# Patient Record
Sex: Male | Born: 1945
Health system: Southern US, Community
[De-identification: ages and names within clinical notes are randomized; demographics above are authoritative.]

## PROBLEM LIST (undated history)

## (undated) DIAGNOSIS — E119 Type 2 diabetes mellitus without complications: Secondary | ICD-10-CM

## (undated) DIAGNOSIS — M4800 Spinal stenosis, site unspecified: Secondary | ICD-10-CM

## (undated) DIAGNOSIS — M1990 Unspecified osteoarthritis, unspecified site: Secondary | ICD-10-CM

## (undated) DIAGNOSIS — H269 Unspecified cataract: Secondary | ICD-10-CM

## (undated) HISTORY — DX: Unspecified cataract: H26.9

## (undated) HISTORY — DX: Unspecified osteoarthritis, unspecified site: M19.90

## (undated) HISTORY — DX: Spinal stenosis, site unspecified: M48.00

## (undated) HISTORY — PX: CATARACT EXTRACTION, BILATERAL: SHX1313

---

## 2003-08-25 ENCOUNTER — Emergency Department (HOSPITAL_COMMUNITY): Admission: AD | Admit: 2003-08-25 | Discharge: 2003-08-25 | Payer: Self-pay | Admitting: Family Medicine

## 2017-12-03 DIAGNOSIS — M25529 Pain in unspecified elbow: Secondary | ICD-10-CM | POA: Insufficient documentation

## 2018-09-16 DIAGNOSIS — M25561 Pain in right knee: Secondary | ICD-10-CM | POA: Insufficient documentation

## 2018-09-16 DIAGNOSIS — M109 Gout, unspecified: Secondary | ICD-10-CM | POA: Insufficient documentation

## 2018-11-05 DIAGNOSIS — M19039 Primary osteoarthritis, unspecified wrist: Secondary | ICD-10-CM | POA: Insufficient documentation

## 2019-07-19 ENCOUNTER — Other Ambulatory Visit: Payer: Self-pay

## 2019-07-19 ENCOUNTER — Encounter (HOSPITAL_COMMUNITY): Payer: Self-pay | Admitting: Emergency Medicine

## 2019-07-19 ENCOUNTER — Emergency Department (HOSPITAL_COMMUNITY)
Admission: EM | Admit: 2019-07-19 | Discharge: 2019-07-20 | Disposition: A | Discharge status: Home / Self Care | Payer: Medicare Other | Attending: Emergency Medicine | Admitting: Emergency Medicine

## 2019-07-19 ENCOUNTER — Emergency Department (HOSPITAL_COMMUNITY): Payer: Medicare Other

## 2019-07-19 DIAGNOSIS — Z7984 Long term (current) use of oral hypoglycemic drugs: Secondary | ICD-10-CM | POA: Insufficient documentation

## 2019-07-19 DIAGNOSIS — M542 Cervicalgia: Secondary | ICD-10-CM | POA: Diagnosis present

## 2019-07-19 DIAGNOSIS — Z79899 Other long term (current) drug therapy: Secondary | ICD-10-CM | POA: Diagnosis not present

## 2019-07-19 DIAGNOSIS — M4802 Spinal stenosis, cervical region: Secondary | ICD-10-CM

## 2019-07-19 DIAGNOSIS — E119 Type 2 diabetes mellitus without complications: Secondary | ICD-10-CM | POA: Diagnosis not present

## 2019-07-19 HISTORY — DX: Type 2 diabetes mellitus without complications: E11.9

## 2019-07-19 LAB — CBC
HCT: 38.7 % — ABNORMAL LOW (ref 39.0–52.0)
Hemoglobin: 12.5 g/dL — ABNORMAL LOW (ref 13.0–17.0)
MCH: 29.3 pg (ref 26.0–34.0)
MCHC: 32.3 g/dL (ref 30.0–36.0)
MCV: 90.6 fL (ref 80.0–100.0)
Platelets: 240 10*3/uL (ref 150–400)
RBC: 4.27 MIL/uL (ref 4.22–5.81)
RDW: 14.4 % (ref 11.5–15.5)
WBC: 10.5 10*3/uL (ref 4.0–10.5)
nRBC: 0 % (ref 0.0–0.2)

## 2019-07-19 LAB — BASIC METABOLIC PANEL
Anion gap: 11 (ref 5–15)
BUN: 23 mg/dL (ref 8–23)
CO2: 20 mmol/L — ABNORMAL LOW (ref 22–32)
Calcium: 10.5 mg/dL — ABNORMAL HIGH (ref 8.9–10.3)
Chloride: 104 mmol/L (ref 98–111)
Creatinine, Ser: 1.54 mg/dL — ABNORMAL HIGH (ref 0.61–1.24)
GFR calc Af Amer: 51 mL/min — ABNORMAL LOW (ref >60)
GFR calc non Af Amer: 44 mL/min — ABNORMAL LOW (ref >60)
Glucose, Bld: 209 mg/dL — ABNORMAL HIGH (ref 70–99)
Potassium: 4.1 mmol/L (ref 3.5–5.1)
Sodium: 135 mmol/L (ref 135–145)

## 2019-07-19 LAB — TROPONIN I (HIGH SENSITIVITY)
Troponin I (High Sensitivity): 10 ng/L (ref <18)
Troponin I (High Sensitivity): 10 ng/L (ref <18)

## 2019-07-19 MED ORDER — MORPHINE SULFATE (PF) 4 MG/ML IV SOLN
4.0000 mg | Freq: Once | INTRAVENOUS | Status: AC
  Administered 2019-07-19: 4 mg via INTRAVENOUS
  Filled 2019-07-19: qty 1

## 2019-07-19 MED ORDER — PREDNISONE 10 MG (21) PO TBPK: ORAL_TABLET | ORAL | 0 refills | Status: DC

## 2019-07-19 MED ORDER — HYDROCODONE-ACETAMINOPHEN 5-325 MG PO TABS: 1.0000 | ORAL_TABLET | Freq: Four times a day (QID) | ORAL | 0 refills | Status: DC | PRN

## 2019-07-19 NOTE — Discharge Instructions (Addendum)
Take the medications as prescribed, follow-up with the neurosurgery doctor for further evaluation

## 2019-07-19 NOTE — ED Notes (Signed)
Patient transported to MRI 

## 2019-07-19 NOTE — ED Notes (Signed)
Patient transported to X-ray 

## 2019-07-19 NOTE — ED Triage Notes (Signed)
Pt reports that having neck pains that radiates in bilat shoulders and back x 4 days. Reports that he drove down here from [place] on Sunday. Reports pains are worse when trying to get up from lying position.

## 2019-07-19 NOTE — ED Provider Notes (Signed)
[facility] DEPT Provider Note   CSN: [id number] Arrival date & time: 07/19/19  1737     History   Chief Complaint Chief Complaint  Patient presents with  . Neck Pain  . Shoulder Pain  . Back Pain    HPI Geza Beranek is a 73 y.o. male.     HPI Pt started to have pain in his upper back and shoulder last week.  He was packing up for a move down to [place].  Sx started about a day later.  He drove down to [place] and the sx persisted.     The pain stays in his upper back.  It hurts to move certain positions and roll over.  It goes to his upper neck and both shoulders. He is feeling fatigued.   No shortness of breath.  No cough.  No fevers. No numbness.  H Past Medical History:  Diagnosis Date  . Diabetes mellitus without complication ([place])     There are no active problems to display for this patient.   History reviewed. No pertinent surgical history.      Home Medications    Prior to Admission medications   Medication Sig Start Date End Date Taking? Authorizing Provider  glipiZIDE (GLUCOTROL) 5 MG tablet Take 5 mg by mouth daily before breakfast.   Yes [provider]  ibuprofen (ADVIL) 200 MG tablet Take 200 mg by mouth every 6 (six) hours as needed for moderate pain.   Yes [provider]  Lactobacillus (PROBIOTIC ACIDOPHILUS PO) Take 1 capsule by mouth daily.   Yes [provider]  HYDROcodone-acetaminophen (NORCO/VICODIN) 5-325 MG tablet Take 1 tablet by mouth every 6 (six) hours as needed. 07/19/19   Dorie Rank, MD  predniSONE (STERAPRED UNI-PAK 21 TAB) 10 MG (21) TBPK tablet Take 6 tabs by mouth daily  for 2 days, then 5 tabs for 2 days, then 4 tabs for 2 days, then 3 tabs for 2 days, 2 tabs for 2 days, then 1 tab by mouth daily for 2 days 07/19/19   Dorie Rank, MD    Family History No family history on file.  Social History Social History   Tobacco Use  . Smoking status: Never Smoker  . Smokeless tobacco: Never Used   Substance Use Topics  . Alcohol use: Yes  . Drug use: Not on file     Allergies   Other   Review of Systems Review of Systems  Constitutional: Negative for fever.  Cardiovascular: Negative for chest pain.  Gastrointestinal: Negative for abdominal pain.  All other systems reviewed and are negative.    Physical Exam Updated Vital Signs BP (!) 147/85   Pulse 68   Temp 100 F (37.8 C) (Oral)   Resp 16   SpO2 100%   Physical Exam Vitals signs and nursing note reviewed.  Constitutional:      General: He is not in acute distress.    Appearance: He is well-developed.  HENT:     Head: Normocephalic and atraumatic.     Right Ear: External ear normal.     Left Ear: External ear normal.  Eyes:     General: No scleral icterus.       Right eye: No discharge.        Left eye: No discharge.     Conjunctiva/sclera: Conjunctivae normal.  Neck:     Musculoskeletal: Neck supple.     Trachea: No tracheal deviation.  Cardiovascular:     Rate and  Rhythm: Normal rate and regular rhythm.  Pulmonary:     Effort: Pulmonary effort is normal. No respiratory distress.     Breath sounds: Normal breath sounds. No stridor. No wheezing or rales.  Abdominal:     General: Bowel sounds are normal. There is no distension.     Palpations: Abdomen is soft.     Tenderness: There is no abdominal tenderness. There is no guarding or rebound.  Musculoskeletal:        General: No tenderness.  Skin:    General: Skin is warm and dry.     Findings: No rash.  Neurological:     Mental Status: He is alert.     Cranial Nerves: No cranial nerve deficit (no facial droop, extraocular movements intact, no slurred speech).     Sensory: No sensory deficit.     Motor: Weakness present. No abnormal muscle tone or seizure activity.     Coordination: Coordination normal.     Comments: Grip strength weak bilaterally, patient does have normal bicep flexion bilaterally, questionable weak tricep extension  bilaterally, 5 out of 5 lower extremity strength      ED Treatments / Results  Labs (all labs ordered are listed, but only abnormal results are displayed) Labs Reviewed  CBC - Abnormal; Notable for the following components:      Result Value   Hemoglobin 12.5 (*)    HCT 38.7 (*)    All other components within normal limits  BASIC METABOLIC PANEL - Abnormal; Notable for the following components:   CO2 20 (*)    Glucose, Bld 209 (*)    Creatinine, Ser 1.54 (*)    Calcium 10.5 (*)    GFR calc non Af Amer 44 (*)    GFR calc Af Amer 51 (*)    All other components within normal limits  TROPONIN I (HIGH SENSITIVITY)  TROPONIN I (HIGH SENSITIVITY)    EKG EKG Interpretation  Date/Time:  Tuesday July 19 2019 19:11:39 EDT Ventricular Rate:  71 PR Interval:    QRS Duration: 85 QT Interval:  363 QTC Calculation: 395 R Axis:   33 Text Interpretation:  Sinus rhythm Borderline ST elevation, lateral leads No old tracing to compare Confirmed by Linwood Dibbles [phone]) on 07/19/2019 7:17:51 PM   Radiology Dg Cervical Spine Complete  Result Date: 07/19/2019 CLINICAL DATA:  Neck pain and bilateral shoulder back pain. EXAM: CERVICAL SPINE - COMPLETE 4+ VIEW COMPARISON:  None. FINDINGS: There is no fracture or bone destruction. There is severe degenerative disc disease throughout the cervical spine with disc space narrowing, reversal of the cervical lordosis, osteophyte formation, and multilevel facet arthritis. There is moderate left foraminal stenosis at C3-4. Slight foraminal stenoses at the other levels. IMPRESSION: Multilevel severe degenerative disc with moderate left foraminal stenosis at C3-4. Electronically Signed   By: Francene Boyers M.D.   On: 07/19/2019 19:21   Mr Cervical Spine Wo Contrast  Result Date: 07/19/2019 CLINICAL DATA:  Initial evaluation for acute neck pain. EXAM: MRI CERVICAL SPINE WITHOUT CONTRAST TECHNIQUE: Multiplanar, multisequence MR imaging of the cervical spine was  performed. No intravenous contrast was administered. COMPARISON:  Prior radiograph from earlier same day. FINDINGS: Alignment: Mild reversal of the normal cervical lordosis, apex at C3-4. Trace retrolisthesis of C6 on C7, with mild grade 1 anterolisthesis of C7 on T1. Vertebrae: Vertebral body height maintained without evidence for acute or chronic fracture. Bone marrow signal intensity diffusely heterogeneous due to underlying advanced degenerative change. Small benign hemangioma noted  within the T3 vertebral body. No other discrete or worrisome osseous lesions. Reactive marrow edema seen about the left C5-6, C6-7, and C7-T1 facets as well as the right C7-T1 facet due to facet arthritis. Cord: Signal intensity within the cervical spinal cord grossly within normal limits, although evaluation mildly limited by motion. Posterior Fossa, vertebral arteries, paraspinal tissues: Visualized brain and posterior fossa within normal limits. Craniocervical junction normal. Mild diffuse prevertebral/retropharyngeal edema/effusion, nonspecific, but could be reactive in nature. Paraspinous soft tissues demonstrate no other acute finding. Normal intravascular flow voids seen within the vertebral arteries bilaterally. Disc levels: C2-C3: Mild disc bulge with bilateral uncovertebral hypertrophy. Moderate right greater than left facet degeneration. Flattening of the ventral thecal sac without significant spinal stenosis. Severe right with moderate left C3 foraminal narrowing. C3-C4: Chronic diffuse degenerative disc osteophyte with intervertebral disc space narrowing. Disc osteophyte asymmetric to the left with left greater than right uncovertebral spurring. Broad posterior component flattens and indents the ventral thecal sac, greater on the left. Superimposed left greater than right facet hypertrophy. Resultant moderate spinal stenosis with mild flattening of the ventral spinal cord, greater on the left. Severe left with moderate  right C4 foraminal stenosis. C4-C5: Chronic diffuse degenerative disc osteophyte with intervertebral disc space narrowing. Broad posterior component effaces the ventral thecal sac, greater on the right. Superimposed moderate facet hypertrophy. Resultant moderate spinal stenosis with mild cord flattening. Severe right greater than left C5 foraminal narrowing. C5-C6: Chronic diffuse degenerative disc osteophyte with intervertebral disc space narrowing. Broad posterior component effaces the ventral thecal sac. Superimposed mild facet hypertrophy. Resultant moderate canal with severe bilateral C6 foraminal stenosis. C6-C7: Chronic intervertebral disc space narrowing with diffuse degenerative disc osteophyte. Broad posterior component largely effaces the ventral thecal sac. Superimposed moderate facet hypertrophy with ligament flavum thickening. Moderate spinal stenosis with moderate to severe left greater than right C7 foraminal narrowing. C7-T1: 3 mm anterolisthesis. Associated broad posterior pseudo disc bulge/uncovering. Advanced bilateral facet degeneration with ligamentum flavum hypertrophy. Resultant moderate spinal stenosis with moderate bilateral C8 foraminal narrowing. T1-2: Diffuse degenerative disc osteophyte with intervertebral disc space narrowing. Moderate to advanced bilateral facet hypertrophy. No significant spinal stenosis. Moderate bilateral foraminal narrowing. T2-3: Mild diffuse disc bulge. Mild right greater than left facet hypertrophy. No significant spinal stenosis. Moderate right with mild left foraminal narrowing. IMPRESSION: 1. Advanced multilevel cervical spondylolysis with resultant moderate diffuse spinal stenosis at C3-4 through C7-T1 as above. 2. Multifactorial degenerative changes with resultant moderate to severe multilevel neural foraminal narrowing at essentially all levels as detailed above. 3. Mild reactive edema about the left C5-6 and C6-7 facets as well as the bilateral C7-T1  facets due to facet arthritis. Finding could contribute to underlying neck pain. 4. Mild diffuse prevertebral/retropharyngeal edema/effusion, suspected to be reactive in nature due to underlying extensive degenerative changes. Differential considerations include sequelae of low-grade ligamentous strain/injury or infection, although there is no overt evidence for acute infection within the cervical spine. If there is concern for other acute pathology within the neck, further evaluation with dedicated neck CT would be warranted for further evaluation. Electronically Signed   By: Rise MuBenjamin  McClintock M.D.   On: 07/19/2019 22:14   Dg Chest Portable 1 View  Result Date: 07/19/2019 CLINICAL DATA:  Neck pain and bilateral shoulder and back pain. EXAM: PORTABLE CHEST 1 VIEW COMPARISON:  None FINDINGS: The heart size and mediastinal contours are within normal limits. Both lungs are clear. Mild thoracic scoliosis. IMPRESSION: No significant abnormality. Electronically Signed   By: Fayrene FearingJames  Maxwell M.D.   On: 07/19/2019 19:16    Procedures Procedures (including critical care time)  Medications Ordered in ED Medications  morphine 4 MG/ML injection 4 mg (4 mg Intravenous Given 07/19/19 1940)     Initial Impression / Assessment and Plan / ED Course  I have reviewed the triage vital signs and the nursing notes.  Pertinent labs & imaging results that were available during my care of the patient were reviewed by me and considered in my medical decision making (see chart for details).  Clinical Course as of Jul 18 2340  Tue Jul 19, 2019  1851 Initial low 02 noted.  At bedside 99% without supplemental o2.   [JK]    Clinical Course User Index [JK] Linwood Dibbles, MD     Patient's laboratory tests and imaging tests were reviewed.  CBC and metabolic panel show mild hyperglycemia and elevated creatinine but I doubt this is clinically significant.  Troponins are normal.  EKG is reassuring.  I doubt that his symptoms  are related to any cardiac etiology.  Patient's cervical spine x-rays and MRI do show significant signs of arthritis and cervical spine stenosis.  I discussed the case with neurosurgery, PA [place]ella.  Patient can follow-up in the office within the next day or 2.  They will arrange close follow-up.  I will give the patient a prescription for steroids as well as pain medications.  Final Clinical Impressions(s) / ED Diagnoses   Final diagnoses:  Cervical stenosis of spinal canal    ED Discharge Orders         Ordered    predniSONE (STERAPRED UNI-PAK 21 TAB) 10 MG (21) TBPK tablet     07/19/19 2340    HYDROcodone-acetaminophen (NORCO/VICODIN) 5-325 MG tablet  Every 6 hours PRN     07/19/19 2340           Linwood Dibbles, MD 07/19/19 2343

## 2019-10-06 ENCOUNTER — Other Ambulatory Visit: Payer: Self-pay

## 2019-10-06 ENCOUNTER — Ambulatory Visit (INDEPENDENT_AMBULATORY_CARE_PROVIDER_SITE_OTHER): Payer: Medicare Other | Admitting: Emergency Medicine

## 2019-10-06 ENCOUNTER — Encounter: Payer: Self-pay | Admitting: Emergency Medicine

## 2019-10-06 VITALS — BP 155/78 | HR 74 | Temp 98.2°F | Resp 16 | Ht 73.0 in | Wt 211.2 lb

## 2019-10-06 DIAGNOSIS — E1169 Type 2 diabetes mellitus with other specified complication: Secondary | ICD-10-CM | POA: Insufficient documentation

## 2019-10-06 DIAGNOSIS — M4800 Spinal stenosis, site unspecified: Secondary | ICD-10-CM

## 2019-10-06 DIAGNOSIS — Z8739 Personal history of other diseases of the musculoskeletal system and connective tissue: Secondary | ICD-10-CM | POA: Insufficient documentation

## 2019-10-06 DIAGNOSIS — R21 Rash and other nonspecific skin eruption: Secondary | ICD-10-CM | POA: Diagnosis not present

## 2019-10-06 DIAGNOSIS — Z1211 Encounter for screening for malignant neoplasm of colon: Secondary | ICD-10-CM | POA: Diagnosis not present

## 2019-10-06 DIAGNOSIS — Z7689 Persons encountering health services in other specified circumstances: Secondary | ICD-10-CM

## 2019-10-06 DIAGNOSIS — E119 Type 2 diabetes mellitus without complications: Secondary | ICD-10-CM | POA: Insufficient documentation

## 2019-10-06 DIAGNOSIS — Z23 Encounter for immunization: Secondary | ICD-10-CM

## 2019-10-06 LAB — POCT GLYCOSYLATED HEMOGLOBIN (HGB A1C): Hemoglobin A1C: 7.6 % — AB (ref 4.0–5.6)

## 2019-10-06 LAB — GLUCOSE, POCT (MANUAL RESULT ENTRY): POC Glucose: 120 mg/dl — AB (ref 70–99)

## 2019-10-06 NOTE — Patient Instructions (Addendum)
   If you have lab work done today you will be contacted with your lab results within the next 2 weeks.  If you have not heard from us then please contact us. The fastest way to get your results is to register for My Chart.   IF you received an x-ray today, you will receive an invoice from [facility]. Please contact [facility] at [phone] with questions or concerns regarding your invoice.   IF you received labwork today, you will receive an invoice from [facility]. Please contact [facility] at [phone] with questions or concerns regarding your invoice.   Our billing staff will not be able to assist you with questions regarding bills from these companies.  You will be contacted with the lab results as soon as they are available. The fastest way to get your results is to activate your My Chart account. Instructions are located on the last page of this paperwork. If you have not heard from us regarding the results in 2 weeks, please contact this office.     Health Maintenance After Age 65 After age 65, you are at a higher risk for certain long-term diseases and infections as well as injuries from falls. Falls are a major cause of broken bones and head injuries in people who are older than age 65. Getting regular preventive care can help to keep you healthy and well. Preventive care includes getting regular testing and making lifestyle changes as recommended by your health care provider. Talk with your health care provider about:  Which screenings and tests you should have. A screening is a test that checks for a disease when you have no symptoms.  A diet and exercise plan that is right for you. What should I know about screenings and tests to prevent falls? Screening and testing are the best ways to find a health problem early. Early diagnosis and treatment give you the best chance of managing medical conditions that are common after age 65. Certain conditions and  lifestyle choices may make you more likely to have a fall. Your health care provider may recommend:  Regular vision checks. Poor vision and conditions such as cataracts can make you more likely to have a fall. If you wear glasses, make sure to get your prescription updated if your vision changes.  Medicine review. Work with your health care provider to regularly review all of the medicines you are taking, including over-the-counter medicines. Ask your health care provider about any side effects that may make you more likely to have a fall. Tell your health care provider if any medicines that you take make you feel dizzy or sleepy.  Osteoporosis screening. Osteoporosis is a condition that causes the bones to get weaker. This can make the bones weak and cause them to break more easily.  Blood pressure screening. Blood pressure changes and medicines to control blood pressure can make you feel dizzy.  Strength and balance checks. Your health care provider may recommend certain tests to check your strength and balance while standing, walking, or changing positions.  Foot health exam. Foot pain and numbness, as well as not wearing proper footwear, can make you more likely to have a fall.  Depression screening. You may be more likely to have a fall if you have a fear of falling, feel emotionally low, or feel unable to do activities that you used to do.  Alcohol use screening. Using too much alcohol can affect your balance and may make you more likely to   have a fall. What actions can I take to lower my risk of falls? General instructions  Talk with your health care provider about your risks for falling. Tell your health care provider if: ? You fall. Be sure to tell your health care provider about all falls, even ones that seem minor. ? You feel dizzy, sleepy, or off-balance.  Take over-the-counter and prescription medicines only as told by your health care provider. These include any  supplements.  Eat a healthy diet and maintain a healthy weight. A healthy diet includes low-fat dairy products, low-fat (lean) meats, and fiber from whole grains, beans, and lots of fruits and vegetables. Home safety  Remove any tripping hazards, such as rugs, cords, and clutter.  Install safety equipment such as grab bars in bathrooms and safety rails on stairs.  Keep rooms and walkways well-lit. Activity   Follow a regular exercise program to stay fit. This will help you maintain your balance. Ask your health care provider what types of exercise are appropriate for you.  If you need a cane or walker, use it as recommended by your health care provider.  Wear supportive shoes that have nonskid soles. Lifestyle  Do not drink alcohol if your health care provider tells you not to drink.  If you drink alcohol, limit how much you have: ? 0-1 drink a day for women. ? 0-2 drinks a day for men.  Be aware of how much alcohol is in your drink. In the [place] one drink equals one typical bottle of beer (12 oz), one-half glass of wine (5 oz), or one shot of hard liquor (1 oz).  Do not use any products that contain nicotine or tobacco, such as cigarettes and e-cigarettes. If you need help quitting, ask your health care provider. Summary  Having a healthy lifestyle and getting preventive care can help to protect your health and wellness after age 65.  Screening and testing are the best way to find a health problem early and help you avoid having a fall. Early diagnosis and treatment give you the best chance for managing medical conditions that are more common for people who are older than age 65.  Falls are a major cause of broken bones and head injuries in people who are older than age 65. Take precautions to prevent a fall at home.  Work with your health care provider to learn what changes you can make to improve your health and wellness and to prevent falls. This information is not intended  to replace advice given to you by your health care provider. Make sure you discuss any questions you have with your health care provider. Document Released: 09/16/2017 Document Revised: 02/24/2019 Document Reviewed: 09/16/2017 [employer]vier Patient Education  2020 [employer]  

## 2019-10-06 NOTE — Progress Notes (Signed)
Steven PollenJohn Knapp 73 y.o.   Chief Complaint  Patient presents with  . Establish Care  . Diabetes    Glipizide    HISTORY OF PRESENT ILLNESS: This is a 73 y.o. male here to establish care with me.  First visit to this office.  [place]. Has the following chronic medical problems: 1.  Diabetes: On glipizide 5 mg once a day. 2.  Spinal stenosis and chronic pain. 3.  History of gout: Occasionally takes indomethacin.  Was intolerant to allopurinol in the past. 4.  History of rheumatoid arthritis: Not seen rheumatologist. 5.  Chronic intermittent rash to extremities.  Needs dermatology referral. Has no complaints or medical concerns today. Family history positive for diabetes hypertension and kidney disease. HPI   Prior to Admission medications   Medication Sig Start Date End Date Taking? Authorizing Provider  clobetasol cream (TEMOVATE) 0.05 % Apply 1 application topically 2 (two) times daily.   Yes [provider]  glipiZIDE (GLUCOTROL) 5 MG tablet Take 5 mg by mouth daily before breakfast.   Yes [provider]  HYDROcodone-acetaminophen (NORCO/VICODIN) 5-325 MG tablet Take 1 tablet by mouth every 6 (six) hours as needed. 07/19/19  Yes Linwood DibblesKnapp, Jon, MD  indomethacin (INDOCIN) 50 MG capsule Take 50 mg by mouth 2 (two) times daily with a meal.   Yes [provider]  ibuprofen (ADVIL) 200 MG tablet Take 200 mg by mouth every 6 (six) hours as needed for moderate pain.    [provider]  Lactobacillus (PROBIOTIC ACIDOPHILUS PO) Take 1 capsule by mouth daily.    [provider]  predniSONE (STERAPRED UNI-PAK 21 TAB) 10 MG (21) TBPK tablet Take 6 tabs by mouth daily  for 2 days, then 5 tabs for 2 days, then 4 tabs for 2 days, then 3 tabs for 2 days, 2 tabs for 2 days, then 1 tab by mouth daily for 2 days Patient not taking: Reported on 10/06/2019 07/19/19   Linwood DibblesKnapp, Jon, MD    Allergies  Allergen Reactions  . Other Hives and  Itching    Peanuts, strawberries, pickles, wine, and corn    There are no active problems to display for this patient.   Past Medical History:  Diagnosis Date  . Diabetes mellitus without complication (HCC)     No past surgical history on file.  Social History   Socioeconomic History  . Marital status: Married    Spouse name: Not on file  . Number of children: Not on file  . Years of education: Not on file  . Highest education level: Not on file  Occupational History  . Not on file  Social Needs  . Financial resource strain: Not on file  . Food insecurity    Worry: Not on file    Inability: Not on file  . Transportation needs    Medical: Not on file    Non-medical: Not on file  Tobacco Use  . Smoking status: Former Smoker    Years: 4.00    Types: Cigarettes  . Smokeless tobacco: Never Used  Substance and Sexual Activity  . Alcohol use: Yes    Alcohol/week: 3.0 standard drinks    Types: 3 Standard drinks or equivalent per week  . Drug use: Never  . Sexual activity: Not on file  Lifestyle  . Physical activity    Days per week: Not on file    Minutes per session: Not on file  . Stress: Not on file  Relationships  . Social Musician on phone: Not on file    Gets together: Not on file    Attends religious service: Not on file    Active member of club or organization: Not on file    Attends meetings of clubs or organizations: Not on file    Relationship status: Not on file  . Intimate partner violence    Fear of current or ex partner: Not on file    Emotionally abused: Not on file    Physically abused: Not on file    Forced sexual activity: Not on file  Other Topics Concern  . Not on file  Social History Narrative  . Not on file    No family history on file.   Review of Systems  Constitutional: Negative.  Negative for chills and fever.  HENT: Negative.  Negative for congestion and sore throat.   Eyes: Negative.  Negative for blurred vision  and double vision.  Respiratory: Negative.  Negative for cough and shortness of breath.   Cardiovascular: Negative.  Negative for chest pain and palpitations.  Gastrointestinal: Negative.  Negative for abdominal pain, blood in stool, diarrhea, melena, nausea and vomiting.  Genitourinary: Negative.  Negative for dysuria and hematuria.  Musculoskeletal: Negative.  Negative for back pain, myalgias and neck pain.  Skin: Positive for rash (Chronic).  Neurological: Negative.  Negative for dizziness and headaches.  Endo/Heme/Allergies: Negative.   All other systems reviewed and are negative.  Vitals:   10/06/19 1023  BP: (!) 155/78  Pulse: 74  Resp: 16  Temp: 98.2 F (36.8 C)  SpO2: 100%     Physical Exam Vitals signs reviewed.  Constitutional:      Appearance: Normal appearance.  HENT:     Head: Normocephalic.  Eyes:     Extraocular Movements: Extraocular movements intact.     Conjunctiva/sclera: Conjunctivae normal.     Pupils: Pupils are equal, round, and reactive to light.  Neck:     Musculoskeletal: Normal range of motion and neck supple.  Cardiovascular:     Rate and Rhythm: Normal rate and regular rhythm.     Pulses: Normal pulses.     Heart sounds: Normal heart sounds.  Pulmonary:     Effort: Pulmonary effort is normal.     Breath sounds: Normal breath sounds.  Abdominal:     General: Bowel sounds are normal. There is no distension.     Palpations: Abdomen is soft. There is no mass.     Tenderness: There is no abdominal tenderness.  Skin:    General: Skin is warm and dry.     Capillary Refill: Capillary refill takes less than 2 seconds.     Findings: Rash (All extremities) present.  Neurological:     General: No focal deficit present.     Mental Status: He is alert and oriented to person, place, and time.  Psychiatric:        Mood and Affect: Mood normal.        Behavior: Behavior normal.      ASSESSMENT & PLAN:  Steven Knapp was seen today for establish care and  diabetes.  Diagnoses and all orders for this visit:  Type 2 diabetes mellitus with other specified complication, without long-term current use of insulin (HCC) -     CBC with Differential -     Comprehensive metabolic panel -     Lipid panel -     Ambulatory referral to Ophthalmology -  POCT glucose (manual entry) -     POCT glycosylated hemoglobin (Hb A1C)  Rash and nonspecific skin eruption -     Ambulatory referral to Dermatology  Colon cancer screening -     Ambulatory referral to Gastroenterology  Spinal stenosis, unspecified spinal region  History of gout  History of rheumatoid arthritis  Encounter to establish care  Other orders -     Pneumococcal (PPSV23) vaccine    Patient Instructions       If you have lab work done today you will be contacted with your lab results within the next 2 weeks.  If you have not heard from [place] then please contact us. The fastest way to get your results is to register for My Chart.   IF you received an x-ray today, you will receive an invoice from [facility]. Please contact [facility] at [phone] with questions or concerns regarding your invoice.   IF you received labwork today, you will receive an invoice from [place]. Please contact [facility] at [phone] with questions or concerns regarding your invoice.   Our billing staff will not be able to assist you with questions regarding bills from these companies.  You will be contacted with the lab results as soon as they are available. The fastest way to get your results is to activate your My Chart account. Instructions are located on the last page of this paperwork. If you have not heard from [place] regarding the results in 2 weeks, please contact this office.     Health Maintenance After Age 15 After age 13, you are at a higher risk for certain long-term diseases and infections as well as injuries from falls. Falls are a major cause of broken bones and  head injuries in people who are older than age 48. Getting regular preventive care can help to keep you healthy and well. Preventive care includes getting regular testing and making lifestyle changes as recommended by your health care provider. Talk with your health care provider about:  Which screenings and tests you should have. A screening is a test that checks for a disease when you have no symptoms.  A diet and exercise plan that is right for you. What should I know about screenings and tests to prevent falls? Screening and testing are the best ways to find a health problem early. Early diagnosis and treatment give you the best chance of managing medical conditions that are common after age 41. Certain conditions and lifestyle choices may make you more likely to have a fall. Your health care provider may recommend:  Regular vision checks. Poor vision and conditions such as cataracts can make you more likely to have a fall. If you wear glasses, make sure to get your prescription updated if your vision changes.  Medicine review. Work with your health care provider to regularly review all of the medicines you are taking, including over-the-counter medicines. Ask your health care provider about any side effects that may make you more likely to have a fall. Tell your health care provider if any medicines that you take make you feel dizzy or sleepy.  Osteoporosis screening. Osteoporosis is a condition that causes the bones to get weaker. This can make the bones weak and cause them to break more easily.  Blood pressure screening. Blood pressure changes and medicines to control blood pressure can make you feel dizzy.  Strength and balance checks. Your health care provider may recommend certain tests to check your strength and balance while  standing, walking, or changing positions.  Foot health exam. Foot pain and numbness, as well as not wearing proper footwear, can make you more likely to have a  fall.  Depression screening. You may be more likely to have a fall if you have a fear of falling, feel emotionally low, or feel unable to do activities that you used to do.  Alcohol use screening. Using too much alcohol can affect your balance and may make you more likely to have a fall. What actions can I take to lower my risk of falls? General instructions  Talk with your health care provider about your risks for falling. Tell your health care provider if: ? You fall. Be sure to tell your health care provider about all falls, even ones that seem minor. ? You feel dizzy, sleepy, or off-balance.  Take over-the-counter and prescription medicines only as told by your health care provider. These include any supplements.  Eat a healthy diet and maintain a healthy weight. A healthy diet includes low-fat dairy products, low-fat (lean) meats, and fiber from whole grains, beans, and lots of fruits and vegetables. Home safety  Remove any tripping hazards, such as rugs, cords, and clutter.  Install safety equipment such as grab bars in bathrooms and safety rails on stairs.  Keep rooms and walkways well-lit. Activity   Follow a regular exercise program to stay fit. This will help you maintain your balance. Ask your health care provider what types of exercise are appropriate for you.  If you need a cane or walker, use it as recommended by your health care provider.  Wear supportive shoes that have nonskid soles. Lifestyle  Do not drink alcohol if your health care provider tells you not to drink.  If you drink alcohol, limit how much you have: ? 0-1 drink a day for women. ? 0-2 drinks a day for men.  Be aware of how much alcohol is in your drink. In the [place] one drink equals one typical bottle of beer (12 oz), one-half glass of wine (5 oz), or one shot of hard liquor (1 oz).  Do not use any products that contain nicotine or tobacco, such as cigarettes and e-cigarettes. If you need help  quitting, ask your health care provider. Summary  Having a healthy lifestyle and getting preventive care can help to protect your health and wellness after age 40.  Screening and testing are the best way to find a health problem early and help you avoid having a fall. Early diagnosis and treatment give you the best chance for managing medical conditions that are more common for people who are older than age 40.  Falls are a major cause of broken bones and head injuries in people who are older than age 71. Take precautions to prevent a fall at home.  Work with your health care provider to learn what changes you can make to improve your health and wellness and to prevent falls. This information is not intended to replace advice given to you by your health care provider. Make sure you discuss any questions you have with your health care provider. Document Released: 09/16/2017 Document Revised: 02/24/2019 Document Reviewed: 09/16/2017 Elsevier Patient Education  2020 Elsevier Inc.      Agustina Caroli, MD [facility]

## 2019-10-07 ENCOUNTER — Other Ambulatory Visit: Payer: Self-pay | Admitting: Emergency Medicine

## 2019-10-07 DIAGNOSIS — E1169 Type 2 diabetes mellitus with other specified complication: Secondary | ICD-10-CM

## 2019-10-07 LAB — LIPID PANEL
Chol/HDL Ratio: 2.4 ratio (ref 0.0–5.0)
Cholesterol, Total: 191 mg/dL (ref 100–199)
HDL: 80 mg/dL (ref >39)
LDL Chol Calc (NIH): 99 mg/dL (ref 0–99)
Triglycerides: 64 mg/dL (ref 0–149)
VLDL Cholesterol Cal: 12 mg/dL (ref 5–40)

## 2019-10-07 LAB — COMPREHENSIVE METABOLIC PANEL
ALT: 14 IU/L (ref 0–44)
AST: 20 IU/L (ref 0–40)
Albumin/Globulin Ratio: 1.5 (ref 1.2–2.2)
Albumin: 4.2 g/dL (ref 3.7–4.7)
Alkaline Phosphatase: 87 IU/L (ref 39–117)
BUN/Creatinine Ratio: 15 (ref 10–24)
BUN: 21 mg/dL (ref 8–27)
Bilirubin Total: 0.4 mg/dL (ref 0.0–1.2)
CO2: 20 mmol/L (ref 20–29)
Calcium: 9.7 mg/dL (ref 8.6–10.2)
Chloride: 106 mmol/L (ref 96–106)
Creatinine, Ser: 1.37 mg/dL — ABNORMAL HIGH (ref 0.76–1.27)
GFR calc Af Amer: 58 mL/min/{1.73_m2} — ABNORMAL LOW (ref >59)
GFR calc non Af Amer: 50 mL/min/{1.73_m2} — ABNORMAL LOW (ref >59)
Globulin, Total: 2.8 g/dL (ref 1.5–4.5)
Glucose: 122 mg/dL — ABNORMAL HIGH (ref 65–99)
Potassium: 4.8 mmol/L (ref 3.5–5.2)
Sodium: 141 mmol/L (ref 134–144)
Total Protein: 7 g/dL (ref 6.0–8.5)

## 2019-10-07 LAB — CBC WITH DIFFERENTIAL/PLATELET
Basophils Absolute: 0 10*3/uL (ref 0.0–0.2)
Basos: 1 %
EOS (ABSOLUTE): 0.2 10*3/uL (ref 0.0–0.4)
Eos: 2 %
Hematocrit: 36.8 % — ABNORMAL LOW (ref 37.5–51.0)
Hemoglobin: 12.3 g/dL — ABNORMAL LOW (ref 13.0–17.7)
Immature Grans (Abs): 0 10*3/uL (ref 0.0–0.1)
Immature Granulocytes: 0 %
Lymphocytes Absolute: 1.5 10*3/uL (ref 0.7–3.1)
Lymphs: 19 %
MCH: 28.7 pg (ref 26.6–33.0)
MCHC: 33.4 g/dL (ref 31.5–35.7)
MCV: 86 fL (ref 79–97)
Monocytes Absolute: 0.9 10*3/uL (ref 0.1–0.9)
Monocytes: 11 %
Neutrophils Absolute: 5.2 10*3/uL (ref 1.4–7.0)
Neutrophils: 67 %
Platelets: 318 10*3/uL (ref 150–450)
RBC: 4.29 x10E6/uL (ref 4.14–5.80)
RDW: 15.3 % (ref 11.6–15.4)
WBC: 7.7 10*3/uL (ref 3.4–10.8)

## 2019-10-07 MED ORDER — TRULICITY 0.75 MG/0.5ML ~~LOC~~ SOAJ: 0.7500 mg | SUBCUTANEOUS | 3 refills | Status: DC

## 2019-10-07 MED ORDER — ROSUVASTATIN CALCIUM 10 MG PO TABS: 10.0000 mg | ORAL_TABLET | Freq: Every day | ORAL | 3 refills | Status: DC

## 2019-10-12 ENCOUNTER — Other Ambulatory Visit: Payer: Self-pay

## 2019-10-12 ENCOUNTER — Emergency Department (HOSPITAL_COMMUNITY)
Admission: EM | Admit: 2019-10-12 | Discharge: 2019-10-12 | Disposition: A | Discharge status: Home / Self Care | Payer: Medicare Other | Attending: Emergency Medicine | Admitting: Emergency Medicine

## 2019-10-12 ENCOUNTER — Encounter (HOSPITAL_COMMUNITY): Payer: Self-pay

## 2019-10-12 DIAGNOSIS — Z7984 Long term (current) use of oral hypoglycemic drugs: Secondary | ICD-10-CM | POA: Diagnosis not present

## 2019-10-12 DIAGNOSIS — Z87891 Personal history of nicotine dependence: Secondary | ICD-10-CM | POA: Diagnosis not present

## 2019-10-12 DIAGNOSIS — Z79899 Other long term (current) drug therapy: Secondary | ICD-10-CM | POA: Diagnosis not present

## 2019-10-12 DIAGNOSIS — M10062 Idiopathic gout, left knee: Secondary | ICD-10-CM | POA: Insufficient documentation

## 2019-10-12 DIAGNOSIS — E119 Type 2 diabetes mellitus without complications: Secondary | ICD-10-CM | POA: Diagnosis not present

## 2019-10-12 DIAGNOSIS — M25562 Pain in left knee: Secondary | ICD-10-CM | POA: Diagnosis present

## 2019-10-12 DIAGNOSIS — M109 Gout, unspecified: Secondary | ICD-10-CM

## 2019-10-12 MED ORDER — DEXAMETHASONE SODIUM PHOSPHATE 10 MG/ML IJ SOLN
8.0000 mg | Freq: Once | INTRAMUSCULAR | Status: AC
  Administered 2019-10-12: 8 mg via INTRAMUSCULAR
  Filled 2019-10-12: qty 1

## 2019-10-12 MED ORDER — INDOMETHACIN 50 MG PO CAPS: 50.0000 mg | ORAL_CAPSULE | Freq: Two times a day (BID) | ORAL | 0 refills | Status: DC

## 2019-10-12 MED ORDER — HYDROCODONE-ACETAMINOPHEN 5-325 MG PO TABS
1.0000 | ORAL_TABLET | Freq: Once | ORAL | Status: AC
  Administered 2019-10-12: 1 via ORAL
  Filled 2019-10-12: qty 1

## 2019-10-12 MED ORDER — HYDROCODONE-ACETAMINOPHEN 5-325 MG PO TABS: 1.0000 | ORAL_TABLET | Freq: Four times a day (QID) | ORAL | 0 refills | Status: DC | PRN

## 2019-10-12 NOTE — ED Triage Notes (Signed)
EMS reports from home. C/o bilateral knee pain x 1 week. Pt states pain with ambulation effecting normal mobility.  BP 140/80 HR 92 RR 16 Sp02 98 RA CBG 321 Temp 98.1

## 2019-10-12 NOTE — Discharge Instructions (Signed)
Please read instructions below. Apply ice to your areas of pain for 20 minutes at a time. Elevate your left leg as much as possible. You can take indomethacin as directed as needed for pain. You can take the hydrocodone for breakthrough pain. Schedule an appointment with your primary care provider for follow up on your visit and if symptoms do not improve in 2 days.  Return to the ER for fever/chills, or new or concerning symptoms.

## 2019-10-12 NOTE — ED Provider Notes (Signed)
[facility]-EMERGENCY DEPT Provider Note   CSN: [id number] Arrival date & time: 10/12/19  1431     History   Chief Complaint Chief Complaint  Patient presents with  . Knee Pain    HPI Steven Knapp is a 73 y.o. male.  Past medical history of type 2 diabetes, gout, rheumatoid arthritis, presenting to the emergency department with flare of his arthritis x1 week.  He states his symptoms gradually began after eating a large shrimp dinner.  Initially his right knee was painful, however he treated with indomethacin and had improvement.  His left knee is now very painful and swollen.  He is also having some generalized discomfort in his bilateral ankles.  He states he ran out of his indomethacin 2 days ago and has been treating with ibuprofen with minimal relief.  He established a new primary care provider and was seen 6 days ago.  He states he mentioned his symptoms then, however there is no documentation of further recommendation made for patient's symptoms.  He denies fever, chills, or any other systemic symptoms.  No injuries.  His symptoms feel pretty typical of his RA his gout flare.     The history is provided by the patient.    Past Medical History:  Diagnosis Date  . Cataract   . Diabetes mellitus without complication [facility]     Patient Active Problem List   Diagnosis Date Noted  . Diabetes mellitus (HCC) 10/06/2019  . Spinal stenosis 10/06/2019  . History of gout 10/06/2019  . History of rheumatoid arthritis 10/06/2019  . Rash and nonspecific skin eruption 10/06/2019    History reviewed. No pertinent surgical history.      Home Medications    Prior to Admission medications   Medication Sig Start Date End Date Taking? Authorizing Provider  clobetasol cream (TEMOVATE) 0.05 % Apply 1 application topically 2 (two) times daily.    [provider]  Dulaglutide (TRULICITY) 0.75 MG/0.5ML SOPN Inject 0.75 mg into the skin once a week. 10/07/19    Georgina Quint, MD  glipiZIDE (GLUCOTROL) 5 MG tablet Take 5 mg by mouth daily before breakfast.    [provider]  HYDROcodone-acetaminophen (NORCO/VICODIN) 5-325 MG tablet Take 1 tablet by mouth every 6 (six) hours as needed for severe pain. 10/12/19   , [place] N, PA-C  ibuprofen (ADVIL) 200 MG tablet Take 200 mg by mouth every 6 (six) hours as needed for moderate pain.    [provider]  indomethacin (INDOCIN) 50 MG capsule Take 1 capsule (50 mg total) by mouth 2 (two) times daily with a meal. 10/12/19   Roxan Hockey, [place] N, PA-C  Lactobacillus (PROBIOTIC ACIDOPHILUS PO) Take 1 capsule by mouth daily.    [provider]  predniSONE (STERAPRED UNI-PAK 21 TAB) 10 MG (21) TBPK tablet Take 6 tabs by mouth daily  for 2 days, then 5 tabs for 2 days, then 4 tabs for 2 days, then 3 tabs for 2 days, 2 tabs for 2 days, then 1 tab by mouth daily for 2 days Patient not taking: Reported on 10/06/2019 07/19/19   Linwood Dibbles, MD  rosuvastatin (CRESTOR) 10 MG tablet Take 1 tablet (10 mg total) by mouth daily. 10/07/19   Georgina Quint, MD    Family History Family History  Problem Relation Age of Onset  . Diabetes Mother   . Hypertension Mother     Social History Social History   Tobacco Use  . Smoking status: Former Smoker  Years: 4.00    Types: Cigarettes  . Smokeless tobacco: Never Used  Substance Use Topics  . Alcohol use: Yes    Alcohol/week: 3.0 standard drinks    Types: 3 Standard drinks or equivalent per week  . Drug use: Never     Allergies   Other   Review of Systems Review of Systems  Constitutional: Negative for chills and fever.  Musculoskeletal: Positive for arthralgias and joint swelling.  Skin: Negative for color change.     Physical Exam Updated Vital Signs BP 140/80   Pulse 92   Temp 98.1 F (36.7 C) (Oral)   Resp 16   SpO2 98%   Physical Exam Vitals signs and nursing note reviewed.  Constitutional:       Appearance: He is well-developed. He is not ill-appearing.  HENT:     Head: Normocephalic and atraumatic.  Eyes:     Conjunctiva/sclera: Conjunctivae normal.  Cardiovascular:     Rate and Rhythm: Normal rate and regular rhythm.  Pulmonary:     Effort: Pulmonary effort is normal. No respiratory distress.     Breath sounds: Normal breath sounds.  Abdominal:     Palpations: Abdomen is soft.  Musculoskeletal:     Comments: Left knee with large swelling and diffuse TTP and warmth. Pt unable to range the knee 2/t pain. No redness or wounds. No swelling to the calves. Mild swelling to b/l ankles. No redness. Intact distal pulses.  Skin:    General: Skin is warm.  Neurological:     Mental Status: He is alert.  Psychiatric:        Mood and Affect: Mood normal.        Behavior: Behavior normal.      ED Treatments / Results  Labs (all labs ordered are listed, but only abnormal results are displayed) Labs Reviewed - No data to display  EKG None  Radiology No results found.  Procedures Procedures (including critical care time)  Medications Ordered in ED Medications  HYDROcodone-acetaminophen (NORCO/VICODIN) 5-325 MG per tablet 1 tablet (has no administration in time range)  dexamethasone (DECADRON) injection 8 mg (has no administration in time range)     Initial Impression / Assessment and Plan / ED Course  I have reviewed the triage vital signs and the nursing notes.  Pertinent labs & imaging results that were available during my care of the patient were reviewed by me and considered in my medical decision making (see chart for details).        Pt with hx of gout and RA, presenting with acute flare. Left knee with swelling and warmth, and pain with ROM. No systemic symptoms to suggest septic joint. No injuries. Symptoms feel typical of flare. Pt usually treats at home with indomethacin, and sometimes requires a short course of narcotic pain medication. Pt given pain  medication and decadron in the ED> Will discharge with small amount of hydrocodone for breakthrough pain and refill of indomethacin. Instructed close PCP follow up if symptoms do not improve. Return to ED if systemic symptoms.  Discussed results, findings, treatment and follow up. Patient advised of return precautions. Patient verbalized understanding and agreed with plan.  [place] Controlled Substance reporting System queried  Final Clinical Impressions(s) / ED Diagnoses   Final diagnoses:  Acute gout of left knee, unspecified cause    ED Discharge Orders         Ordered    indomethacin (INDOCIN) 50 MG capsule  2 times daily with meals  10/12/19 1553    HYDROcodone-acetaminophen (NORCO/VICODIN) 5-325 MG tablet  Every 6 hours PRN     10/12/19 1553           , [place] N, [place] 10/12/19 1553    Sherwood Gambler, MD 10/12/19 [phone]

## 2019-10-21 ENCOUNTER — Encounter: Payer: Self-pay | Admitting: Radiology

## 2019-10-31 ENCOUNTER — Ambulatory Visit (INDEPENDENT_AMBULATORY_CARE_PROVIDER_SITE_OTHER): Payer: Medicare Other | Admitting: Emergency Medicine

## 2019-10-31 ENCOUNTER — Encounter: Payer: Self-pay | Admitting: Emergency Medicine

## 2019-10-31 ENCOUNTER — Other Ambulatory Visit: Payer: Self-pay

## 2019-10-31 VITALS — BP 109/66 | HR 71 | Temp 97.6°F | Ht 74.0 in | Wt 187.0 lb

## 2019-10-31 DIAGNOSIS — D649 Anemia, unspecified: Secondary | ICD-10-CM

## 2019-10-31 DIAGNOSIS — Z8739 Personal history of other diseases of the musculoskeletal system and connective tissue: Secondary | ICD-10-CM | POA: Diagnosis not present

## 2019-10-31 DIAGNOSIS — R5381 Other malaise: Secondary | ICD-10-CM

## 2019-10-31 DIAGNOSIS — R634 Abnormal weight loss: Secondary | ICD-10-CM | POA: Diagnosis not present

## 2019-10-31 DIAGNOSIS — M2550 Pain in unspecified joint: Secondary | ICD-10-CM

## 2019-10-31 DIAGNOSIS — E1165 Type 2 diabetes mellitus with hyperglycemia: Secondary | ICD-10-CM | POA: Diagnosis not present

## 2019-10-31 NOTE — Patient Instructions (Addendum)
   If you have lab work done today you will be contacted with your lab results within the next 2 weeks.  If you have not heard from us then please contact us. The fastest way to get your results is to register for My Chart.   IF you received an x-ray today, you will receive an invoice from [facility]. Please contact [facility] at [phone] with questions or concerns regarding your invoice.   IF you received labwork today, you will receive an invoice from [facility]. Please contact [facility] at [phone] with questions or concerns regarding your invoice.   Our billing staff will not be able to assist you with questions regarding bills from these companies.  You will be contacted with the lab results as soon as they are available. The fastest way to get your results is to activate your My Chart account. Instructions are located on the last page of this paperwork. If you have not heard from us regarding the results in 2 weeks, please contact this office.     Diabetes Mellitus and Nutrition, Adult When you have diabetes (diabetes mellitus), it is very important to have healthy eating habits because your blood sugar (glucose) levels are greatly affected by what you eat and drink. Eating healthy foods in the appropriate amounts, at about the same times every day, can help you:  Control your blood glucose.  Lower your risk of heart disease.  Improve your blood pressure.  Reach or maintain a healthy weight. Every person with diabetes is different, and each person has different needs for a meal plan. Your health care provider may recommend that you work with a diet and nutrition specialist (dietitian) to make a meal plan that is best for you. Your meal plan may vary depending on factors such as:  The calories you need.  The medicines you take.  Your weight.  Your blood glucose, blood pressure, and cholesterol levels.  Your activity level.  Other health conditions  you have, such as heart or kidney disease. How do carbohydrates affect me? Carbohydrates, also called carbs, affect your blood glucose level more than any other type of food. Eating carbs naturally raises the amount of glucose in your blood. Carb counting is a method for keeping track of how many carbs you eat. Counting carbs is important to keep your blood glucose at a healthy level, especially if you use insulin or take certain oral diabetes medicines. It is important to know how many carbs you can safely have in each meal. This is different for every person. Your dietitian can help you calculate how many carbs you should have at each meal and for each snack. Foods that contain carbs include:  Bread, cereal, rice, pasta, and crackers.  Potatoes and corn.  Peas, beans, and lentils.  Milk and yogurt.  Fruit and juice.  Desserts, such as cakes, cookies, ice cream, and candy. How does alcohol affect me? Alcohol can cause a sudden decrease in blood glucose (hypoglycemia), especially if you use insulin or take certain oral diabetes medicines. Hypoglycemia can be a life-threatening condition. Symptoms of hypoglycemia (sleepiness, dizziness, and confusion) are similar to symptoms of having too much alcohol. If your health care provider says that alcohol is safe for you, follow these guidelines:  Limit alcohol intake to no more than 1 drink per day for nonpregnant women and 2 drinks per day for men. One drink equals 12 oz of beer, 5 oz of wine, or 1 oz of hard liquor.    Do not drink on an empty stomach.  Keep yourself hydrated with water, diet soda, or unsweetened iced tea.  Keep in mind that regular soda, juice, and other mixers may contain a lot of sugar and must be counted as carbs. What are tips for following this plan?  Reading food labels  Start by checking the serving size on the "Nutrition Facts" label of packaged foods and drinks. The amount of calories, carbs, fats, and other  nutrients listed on the label is based on one serving of the item. Many items contain more than one serving per package.  Check the total grams (g) of carbs in one serving. You can calculate the number of servings of carbs in one serving by dividing the total carbs by 15. For example, if a food has 30 g of total carbs, it would be equal to 2 servings of carbs.  Check the number of grams (g) of saturated and trans fats in one serving. Choose foods that have low or no amount of these fats.  Check the number of milligrams (mg) of salt (sodium) in one serving. Most people should limit total sodium intake to less than 2,300 mg per day.  Always check the nutrition information of foods labeled as "low-fat" or "nonfat". These foods may be higher in added sugar or refined carbs and should be avoided.  Talk to your dietitian to identify your daily goals for nutrients listed on the label. Shopping  Avoid buying canned, premade, or processed foods. These foods tend to be high in fat, sodium, and added sugar.  Shop around the outside edge of the grocery store. This includes fresh fruits and vegetables, bulk grains, fresh meats, and fresh dairy. Cooking  Use low-heat cooking methods, such as baking, instead of high-heat cooking methods like deep frying.  Cook using healthy oils, such as olive, canola, or sunflower oil.  Avoid cooking with butter, cream, or high-fat meats. Meal planning  Eat meals and snacks regularly, preferably at the same times every day. Avoid going long periods of time without eating.  Eat foods high in fiber, such as fresh fruits, vegetables, beans, and whole grains. Talk to your dietitian about how many servings of carbs you can eat at each meal.  Eat 4-6 ounces (oz) of lean protein each day, such as lean meat, chicken, fish, eggs, or tofu. One oz of lean protein is equal to: ? 1 oz of meat, chicken, or fish. ? 1 egg. ?  cup of tofu.  Eat some foods each day that contain  healthy fats, such as avocado, nuts, seeds, and fish. Lifestyle  Check your blood glucose regularly.  Exercise regularly as told by your health care provider. This may include: ? 150 minutes of moderate-intensity or vigorous-intensity exercise each week. This could be brisk walking, biking, or water aerobics. ? Stretching and doing strength exercises, such as yoga or weightlifting, at least 2 times a week.  Take medicines as told by your health care provider.  Do not use any products that contain nicotine or tobacco, such as cigarettes and e-cigarettes. If you need help quitting, ask your health care provider.  Work with a counselor or diabetes educator to identify strategies to manage stress and any emotional and social challenges. Questions to ask a health care provider  Do I need to meet with a diabetes educator?  Do I need to meet with a dietitian?  What number can I call if I have questions?  When are the best times to   check my blood glucose? Where to find more information:  American Diabetes Association: diabetes.org  Academy of Nutrition and Dietetics: www.eatright.org  National Institute of Diabetes and Digestive and Kidney Diseases (NIH): www.niddk.nih.gov Summary  A healthy meal plan will help you control your blood glucose and maintain a healthy lifestyle.  Working with a diet and nutrition specialist (dietitian) can help you make a meal plan that is best for you.  Keep in mind that carbohydrates (carbs) and alcohol have immediate effects on your blood glucose levels. It is important to count carbs and to use alcohol carefully. This information is not intended to replace advice given to you by your health care provider. Make sure you discuss any questions you have with your health care provider. Document Released: 07/31/2005 Document Revised: 10/16/2017 Document Reviewed: 12/08/2016 Elsevier Patient Education  2020 Elsevier Inc.  

## 2019-10-31 NOTE — Progress Notes (Signed)
Steven Knapp 73 y.o.   Chief Complaint  Patient presents with  . in home care    PT (like to have access to pools) tools to stay active.     HISTORY OF PRESENT ILLNESS: This is a 73 y.o. male requesting home health assessment for certain needs including physical therapy.  Patient has a history of spinal stenosis, rheumatoid arthritis and history of gout with chronic multiple joint pains. Seen by me last month for the first time after he recently moved to the area from [place]. Blood work showed uncontrolled diabetes, anemia, and renal insufficiency.  Started on Trulicity and Crestor.  Already taking glipizide. Was referred to GI for colonoscopy due to chronic anemia but has not scheduled appointment yet.  HPI   Prior to Admission medications   Medication Sig Start Date End Date Taking? Authorizing Provider  clobetasol cream (TEMOVATE) 0.05 % Apply 1 application topically 2 (two) times daily.   Yes [provider]  Dulaglutide (TRULICITY) 0.75 MG/0.5ML SOPN Inject 0.75 mg into the skin once a week. 10/07/19  Yes Rhyen Mazariego, Eilleen KempfMiguel Jose, MD  HYDROcodone-acetaminophen (NORCO/VICODIN) 5-325 MG tablet Take 1 tablet by mouth every 6 (six) hours as needed for severe pain. 10/12/19  Yes Robinson, [place] N, PA-C  ibuprofen (ADVIL) 200 MG tablet Take 200 mg by mouth every 6 (six) hours as needed for moderate pain.   Yes [provider]  indomethacin (INDOCIN) 50 MG capsule Take 1 capsule (50 mg total) by mouth 2 (two) times daily with a meal. 10/12/19  Yes Roxan Hockeyobinson, [place] N, PA-C  COLCRYS 0.6 MG tablet Take 0.6 mg by mouth 2 (two) times daily. 10/25/19   [provider]  glipiZIDE (GLUCOTROL) 5 MG tablet Take 5 mg by mouth daily before breakfast.    [provider]  Lactobacillus (PROBIOTIC ACIDOPHILUS PO) Take 1 capsule by mouth daily.    [provider]  predniSONE (STERAPRED UNI-PAK 21 TAB) 10 MG (21) TBPK tablet Take 6 tabs by mouth daily  for 2 days,  then 5 tabs for 2 days, then 4 tabs for 2 days, then 3 tabs for 2 days, 2 tabs for 2 days, then 1 tab by mouth daily for 2 days Patient not taking: Reported on 10/06/2019 07/19/19   Linwood DibblesKnapp, Jon, MD  rosuvastatin (CRESTOR) 10 MG tablet Take 1 tablet (10 mg total) by mouth daily. Patient not taking: Reported on 10/31/2019 10/07/19   Georgina QuintSagardia, Joscelyn Hardrick Jose, MD    Allergies  Allergen Reactions  . Other Hives and Itching    Peanuts, strawberries, pickles, wine, and corn    Patient Active Problem List   Diagnosis Date Noted  . Diabetes mellitus (HCC) 10/06/2019  . Spinal stenosis 10/06/2019  . History of gout 10/06/2019  . History of rheumatoid arthritis 10/06/2019    Past Medical History:  Diagnosis Date  . Cataract   . Diabetes mellitus without complication (HCC)     History reviewed. No pertinent surgical history.  Social History   Socioeconomic History  . Marital status: Married    Spouse name: Not on file  . Number of children: Not on file  . Years of education: Not on file  . Highest education level: Not on file  Occupational History  . Not on file  Tobacco Use  . Smoking status: Former Smoker    Years: 4.00    Types: Cigarettes  . Smokeless tobacco: Never Used  Substance and Sexual Activity  . Alcohol use: Yes    Alcohol/week: 3.0  standard drinks    Types: 3 Standard drinks or equivalent per week  . Drug use: Never  . Sexual activity: Not on file  Other Topics Concern  . Not on file  Social History Narrative  . Not on file   Social Determinants of Health   Financial Resource Strain:   . Difficulty of Paying Living Expenses: Not on file  Food Insecurity:   . Worried About Programme researcher, broadcasting/film/video in the Last Year: Not on file  . Ran Out of Food in the Last Year: Not on file  Transportation Needs:   . Lack of Transportation (Medical): Not on file  . Lack of Transportation (Non-Medical): Not on file  Physical Activity:   . Days of Exercise per Week: Not on file    . Minutes of Exercise per Session: Not on file  Stress:   . Feeling of Stress : Not on file  Social Connections:   . Frequency of Communication with Friends and Family: Not on file  . Frequency of Social Gatherings with Friends and Family: Not on file  . Attends Religious Services: Not on file  . Active Member of Clubs or Organizations: Not on file  . Attends Banker Meetings: Not on file  . Marital Status: Not on file  Intimate Partner Violence:   . Fear of Current or Ex-Partner: Not on file  . Emotionally Abused: Not on file  . Physically Abused: Not on file  . Sexually Abused: Not on file    Family History  Problem Relation Age of Onset  . Diabetes Mother   . Hypertension Mother      Review of Systems  Constitutional: Positive for weight loss (Close to 30 pounds in the past 4 weeks, unintentional).  HENT: Negative.  Negative for congestion and sore throat.   Respiratory: Negative.  Negative for cough and shortness of breath.   Cardiovascular: Negative.  Negative for chest pain and palpitations.  Gastrointestinal: Negative.  Negative for abdominal pain, diarrhea, nausea and vomiting.  Genitourinary: Negative.  Negative for dysuria and hematuria.  Musculoskeletal: Positive for back pain and joint pain.  Skin: Negative.  Negative for rash.  Neurological: Negative for dizziness and headaches.      Vitals:   10/31/19 1221  BP: 109/66  Pulse: 71  Temp: 97.6 F (36.4 C)  SpO2: 100%    Physical Exam Vitals reviewed.  Constitutional:      Appearance: Normal appearance.  HENT:     Head: Normocephalic.  Eyes:     Extraocular Movements: Extraocular movements intact.     Pupils: Pupils are equal, round, and reactive to light.  Cardiovascular:     Rate and Rhythm: Normal rate.  Pulmonary:     Effort: Pulmonary effort is normal.  Musculoskeletal:        General: Swelling and tenderness present.     Cervical back: Normal range of motion and neck supple.   Skin:    General: Skin is warm and dry.  Neurological:     General: No focal deficit present.     Mental Status: He is alert and oriented to person, place, and time.  Psychiatric:        Mood and Affect: Mood normal.        Behavior: Behavior normal.    A total of 25 minutes was spent in the room with the patient, greater than 50% of which was in counseling/coordination of care regarding differential diagnosis of weight loss and general weakness,  review of most recent blood work and abnormalities including anemia, uncontrolled diabetes, and renal insufficiency, diet and nutrition, home health needs, need for colonoscopy, prognosis and need for follow-up.   ASSESSMENT & PLAN: Steven RuizJohn was seen today for in home care.  Diagnoses and all orders for this visit:  Anemia, unspecified type -     Ambulatory referral to Gastroenterology  Loss of weight -     Thyroid Panel With TSH -     Sedimentation Rate -     Ambulatory referral to Gastroenterology  Uncontrolled type 2 diabetes mellitus with hyperglycemia (HCC)  History of spinal stenosis -     Ambulatory referral to Physical Therapy -     Ambulatory referral to Home Health  Arthralgia of multiple joints -     Ambulatory referral to Physical Therapy -     Ambulatory referral to Home Health  History of gout  Physical deconditioning -     Ambulatory referral to Home Health    Patient Instructions       If you have lab work done today you will be contacted with your lab results within the next 2 weeks.  If you have not heard from us then please contact us. The fastest way to get your results is to register for My Chart.   IF you received an x-ray today, you will receive an invoice from [facility]. Please contact [facility] at [phone] with questions or concerns regarding your invoice.   IF you received labwork today, you will receive an invoice from [facility]. Please contact [facility] at [phone]  with questions or concerns regarding your invoice.   Our billing staff will not be able to assist you with questions regarding bills from these companies.  You will be contacted with the lab results as soon as they are available. The fastest way to get your results is to activate your My Chart account. Instructions are located on the last page of this paperwork. If you have not heard from us regarding the results in 2 weeks, please contact this office.      Diabetes Mellitus and Nutrition, Adult When you have diabetes (diabetes mellitus), it is very important to have healthy eating habits because your blood sugar (glucose) levels are greatly affected by what you eat and drink. Eating healthy foods in the appropriate amounts, at about the same times every day, can help you:  Control your blood glucose.  Lower your risk of heart disease.  Improve your blood pressure.  Reach or maintain a healthy weight. Every person with diabetes is different, and each person has different needs for a meal plan. Your health care provider may recommend that you work with a diet and nutrition specialist (dietitian) to make a meal plan that is best for you. Your meal plan may vary depending on factors such as:  The calories you need.  The medicines you take.  Your weight.  Your blood glucose, blood pressure, and cholesterol levels.  Your activity level.  Other health conditions you have, such as heart or kidney disease. How do carbohydrates affect me? Carbohydrates, also called carbs, affect your blood glucose level more than any other type of food. Eating carbs naturally raises the amount of glucose in your blood. Carb counting is a method for keeping track of how many carbs you eat. Counting carbs is important to keep your blood glucose at a healthy level, especially if you use insulin or take certain oral diabetes medicines. It is important to  know how many carbs you can safely have in each meal. This  is different for every person. Your dietitian can help you calculate how many carbs you should have at each meal and for each snack. Foods that contain carbs include:  Bread, cereal, rice, pasta, and crackers.  Potatoes and corn.  Peas, beans, and lentils.  Milk and yogurt.  Fruit and juice.  Desserts, such as cakes, cookies, ice cream, and candy. How does alcohol affect me? Alcohol can cause a sudden decrease in blood glucose (hypoglycemia), especially if you use insulin or take certain oral diabetes medicines. Hypoglycemia can be a life-threatening condition. Symptoms of hypoglycemia (sleepiness, dizziness, and confusion) are similar to symptoms of having too much alcohol. If your health care provider says that alcohol is safe for you, follow these guidelines:  Limit alcohol intake to no more than 1 drink per day for nonpregnant women and 2 drinks per day for men. One drink equals 12 oz of beer, 5 oz of wine, or 1 oz of hard liquor.  Do not drink on an empty stomach.  Keep yourself hydrated with water, diet soda, or unsweetened iced tea.  Keep in mind that regular soda, juice, and other mixers may contain a lot of sugar and must be counted as carbs. What are tips for following this plan?  Reading food labels  Start by checking the serving size on the "Nutrition Facts" label of packaged foods and drinks. The amount of calories, carbs, fats, and other nutrients listed on the label is based on one serving of the item. Many items contain more than one serving per package.  Check the total grams (g) of carbs in one serving. You can calculate the number of servings of carbs in one serving by dividing the total carbs by 15. For example, if a food has 30 g of total carbs, it would be equal to 2 servings of carbs.  Check the number of grams (g) of saturated and trans fats in one serving. Choose foods that have low or no amount of these fats.  Check the number of milligrams (mg) of salt  (sodium) in one serving. Most people should limit total sodium intake to less than 2,300 mg per day.  Always check the nutrition information of foods labeled as "low-fat" or "nonfat". These foods may be higher in added sugar or refined carbs and should be avoided.  Talk to your dietitian to identify your daily goals for nutrients listed on the label. Shopping  Avoid buying canned, premade, or processed foods. These foods tend to be high in fat, sodium, and added sugar.  Shop around the outside edge of the grocery store. This includes fresh fruits and vegetables, bulk grains, fresh meats, and fresh dairy. Cooking  Use low-heat cooking methods, such as baking, instead of high-heat cooking methods like deep frying.  Cook using healthy oils, such as olive, canola, or sunflower oil.  Avoid cooking with butter, cream, or high-fat meats. Meal planning  Eat meals and snacks regularly, preferably at the same times every day. Avoid going long periods of time without eating.  Eat foods high in fiber, such as fresh fruits, vegetables, beans, and whole grains. Talk to your dietitian about how many servings of carbs you can eat at each meal.  Eat 4-6 ounces (oz) of lean protein each day, such as lean meat, chicken, fish, eggs, or tofu. One oz of lean protein is equal to: ? 1 oz of meat, chicken, or fish. ? 1  egg. ?  cup of tofu.  Eat some foods each day that contain healthy fats, such as avocado, nuts, seeds, and fish. Lifestyle  Check your blood glucose regularly.  Exercise regularly as told by your health care provider. This may include: ? 150 minutes of moderate-intensity or vigorous-intensity exercise each week. This could be brisk walking, biking, or water aerobics. ? Stretching and doing strength exercises, such as yoga or weightlifting, at least 2 times a week.  Take medicines as told by your health care provider.  Do not use any products that contain nicotine or tobacco, such as  cigarettes and e-cigarettes. If you need help quitting, ask your health care provider.  Work with a Veterinary surgeon or diabetes educator to identify strategies to manage stress and any emotional and social challenges. Questions to ask a health care provider  Do I need to meet with a diabetes educator?  Do I need to meet with a dietitian?  What number can I call if I have questions?  When are the best times to check my blood glucose? Where to find more information:  American Diabetes Association: diabetes.org  Academy of Nutrition and Dietetics: www.eatright.[employer] of Diabetes and Digestive and Kidney Diseases (NIH): CarFlippers.tn Summary  A healthy meal plan will help you control your blood glucose and maintain a healthy lifestyle.  Working with a diet and nutrition specialist (dietitian) can help you make a meal plan that is best for you.  Keep in mind that carbohydrates (carbs) and alcohol have immediate effects on your blood glucose levels. It is important to count carbs and to use alcohol carefully. This information is not intended to replace advice given to you by your health care provider. Make sure you discuss any questions you have with your health care provider. Document Released: 07/31/2005 Document Revised: 10/16/2017 Document Reviewed: 12/08/2016 Elsevier Patient Education  2020 Elsevier Inc.      Edwina Barth, MD [facility]

## 2019-11-01 ENCOUNTER — Encounter: Payer: Self-pay | Admitting: Gastroenterology

## 2019-11-01 LAB — THYROID PANEL WITH TSH
Free Thyroxine Index: 2.1 (ref 1.2–4.9)
T3 Uptake Ratio: 26 % (ref 24–39)
T4, Total: 8 ug/dL (ref 4.5–12.0)
TSH: 2.48 u[IU]/mL (ref 0.450–4.500)

## 2019-11-01 LAB — SEDIMENTATION RATE: Sed Rate: 23 mm/hr (ref 0–30)

## 2019-11-04 ENCOUNTER — Telehealth: Payer: Self-pay

## 2019-11-04 ENCOUNTER — Telehealth: Payer: Self-pay | Admitting: Emergency Medicine

## 2019-11-04 NOTE — Telephone Encounter (Signed)
YNX:[id number] from [facility] wants Dr. Mitchel Honour to know pt is getting Physical Therapy this weekend.

## 2019-11-04 NOTE — Telephone Encounter (Signed)
I have informed the provider. She stated understanding.

## 2019-11-04 NOTE — Telephone Encounter (Signed)
I have called [facility] at [phone] and spoke to a representative and she has informed me that her manager is not in right now and she will pass along a message for the manager to call pt about home assessment. I have given Maxim pt phone number to contact him about a good time to get assessment.  Thanks

## 2019-11-21 ENCOUNTER — Telehealth: Payer: Self-pay | Admitting: *Deleted

## 2019-11-21 NOTE — Telephone Encounter (Signed)
Faxed signed PT order to [facility] [phone]. Confirmation page at 8:19 am.

## 2019-11-22 ENCOUNTER — Other Ambulatory Visit: Payer: Self-pay

## 2019-11-22 ENCOUNTER — Ambulatory Visit: Payer: Medicare Other | Attending: Emergency Medicine

## 2019-11-22 DIAGNOSIS — R293 Abnormal posture: Secondary | ICD-10-CM | POA: Insufficient documentation

## 2019-11-22 DIAGNOSIS — M542 Cervicalgia: Secondary | ICD-10-CM

## 2019-11-22 DIAGNOSIS — M436 Torticollis: Secondary | ICD-10-CM

## 2019-11-22 DIAGNOSIS — R252 Cramp and spasm: Secondary | ICD-10-CM

## 2019-11-22 NOTE — Patient Instructions (Signed)
Posture Tips DO: - stand tall and erect - keep chin tucked in - keep head and shoulders in alignment - check posture regularly in mirror or large window - pull head back against headrest in car seat;  Change your position often.  Sit with lumbar support. DON'T: - slouch or slump while watching TV or reading - sit, stand or lie in one position  for too long;  Sitting is especially hard on the spine so if you sit at a desk/use the computer, then stand up often!   Copyright  VHI. All rights reserved.  Posture - Standing   Good posture is important. Avoid slouching and forward head thrust. Maintain curve in low back and align ears over shoul- ders, hips over ankles.  Pull your belly button in toward your back bone.   Copyright  VHI. All rights reserved.  Posture - Sitting

## 2019-11-22 NOTE — Therapy (Signed)
[facility] [address] [place], [place], [address] Phone: [phone]   Fax:  [phone]  Physical Therapy Evaluation  Patient Details  Name: Steven Knapp MRN: [id number] Date of Birth: 09/08/1946 Referring Provider (PT): Edwina Barth, MD    Encounter Date: 11/22/2019  PT End of Session - 11/22/19 1352    Visit Number  1    Number of Visits  12    Date for PT Re-Evaluation  12/30/19    Authorization Type  UHC MCR    PT Start Time  0150    PT Stop Time  0235    PT Time Calculation (min)  45 min    Activity Tolerance  Patient tolerated treatment well;Patient limited by pain    Behavior During Therapy  [facility] for tasks assessed/performed       Past Medical History:  Diagnosis Date  . Cataract   . Diabetes mellitus without complication (HCC)     History reviewed. No pertinent surgical history.  There were no vitals filed for this visit.   Subjective Assessment - 11/22/19 1357    Subjective  He reports spinal stenosis in neck and thoracic spine.  Stiff and intermittant pain more at night getting a good sleep position.    Able to do normal activity.  Symptoms are varied per day. Also has lower back stiffness    Pertinent History  OA both knees, gout,    Limitations  --   driving  weather.   How long can you sit comfortably?  As needed    How long can you stand comfortably?  stiff after getting out of chair.    How long can you walk comfortably?  stiff on starting but gets better with progressive walking. Uses cane at times    Diagnostic tests  Xrays:  MRI: severe OA and mod to sever stenosis    Patient Stated Goals  He wants to regain strength and fucntionas normal as able without pain ache.    Currently in Pain?  Yes    Pain Score  2     Pain Location  Neck    Pain Orientation  Right;Left;Posterior;Upper    Pain Descriptors / Indicators  Dull;Aching    Pain Type  Chronic pain    Pain Onset  More than a month ago     Pain Frequency  Intermittent    Aggravating Factors   turning  head    Pain Relieving Factors  exercise , eat correct,         [facility] PT Assessment - 11/22/19 0001      Assessment   Medical Diagnosis  cervicalgia    Referring Provider (PT)  Edwina Barth, MD     Onset Date/Surgical Date  --   !0/ 2020   Next MD Visit  March 2021    Prior Therapy  No      Precautions   Precautions  None      Restrictions   Weight Bearing Restrictions  No      Balance Screen   Has the patient fallen in the past 6 months  No      Home Environment   Living Environment  Private residence    Living Arrangements  Other relatives      Prior Function   Level of Independence  Needs assistance with homemaking      Cognition   Overall Cognitive Status  Within Functional Limits for tasks assessed      Observation/Other Assessments  Focus on Therapeutic Outcomes (FOTO)   37% limited      Posture/Postural Control   Posture Comments  flat neck       ROM / Strength   AROM / PROM / Strength  AROM;Strength      AROM   AROM Assessment Site  Cervical    Cervical Flexion  25    Cervical Extension  33    Cervical - Right Side Bend  28    Cervical - Left Side Bend  23    Cervical - Right Rotation  49    Cervical - Left Rotation  53      Strength   Overall Strength Comments  Normal shoulder and elbow strength pain with shoulder flex/abduct and extension      Palpation   Palpation comment  Tender bilaterally parapsinals neck and upper  back                Objective measurements completed on examination: See above findings.      [facility] Adult PT Treatment/Exercise - 11/22/19 0001      Self-Care   Self-Care  Posture    Posture  use of pillow  for back and in seat to sit 90/90 and help neck posture.  Chin tuck and retraction             PT Education - 11/22/19 1439    Education Details  POC , Psoture and use of pillows and support in sitting and supine    Person(s)  Educated  Patient    Methods  Explanation;Demonstration;Handout    Comprehension  Verbalized understanding       PT Short Term Goals - 11/22/19 1354      PT SHORT TERM GOAL #1   Title  She will be independent with initial HEP    Time  2    Period  Weeks    Status  New      PT SHORT TERM GOAL #2   Title  He will report pain decreased 30% or more in neck and upper back    Time  3    Period  Weeks    Status  New      PT SHORT TERM GOAL #3   Title  He will demo understanding of good sitting posture    Time  3    Period  Weeks    Status  New      PT SHORT TERM GOAL #4   Title  He will report improved strength and able to walk his dog a 1/4 mile without increased pain    Time  3    Period  Weeks    Status  New        PT Long Term Goals - 11/22/19 1403      PT LONG TERM GOAL #1   Title  He will be able to walk his dog a mile.    Time  6    Period  Weeks    Status  New      PT LONG TERM GOAL #2   Title  He will be able to drive as needed.  without pain    Time  6    Period  Weeks    Status  New      PT LONG TERM GOAL #3   Title  He will be independent with all hEP issued as of last session.    Time  6    Period  Weeks  Status  New      PT LONG TERM GOAL #4   Title  He will report pain decreased 75% or more and able to turn head to end range with 1-2 max pain    Time  6    Period  Weeks    Status  New      PT LONG TERM GOAL #5   Title  FOTO score will improve to 30% limited    Time  6    Period  Weeks    Status  New             Plan - 11/22/19 1353    Clinical Impression Statement  Steven Knapp presents with report of multiple areas of Oa and difficulty walking and rising fronm a chair. Mostly he is limited due to neck and upper back pain due to OA and stenosis.  This started with packing for tmoving and he now is limited  with activity , ROM and tolerance for walking his dog.   He is bette a tthis point  but his pain lingers thouhg variale.    He  should improve with Skilled PT but may have some ongoing pain due to extensive  degenerative changes in spine.  We will give him some hip and knee exercises also.    Personal Factors and Comorbidities  Age;Past/Current Experience;Time since onset of injury/illness/exacerbation;Comorbidity 1    Comorbidities  significant degenerative changes /stenosis    Examination-Activity Limitations  Reach Overhead;Stand;Locomotion Level   driving   Examination-Participation Restrictions  Driving;Community Activity    Stability/Clinical Decision Making  Stable/Uncomplicated    Clinical Decision Making  Low    Rehab Potential  Fair    PT Frequency  2x / week    PT Duration  6 weeks    PT Treatment/Interventions  Electrical Stimulation;Moist Heat;Traction;Ultrasound;Therapeutic exercise;Patient/family education;Passive range of motion;Manual techniques;Dry needling    PT Next Visit Plan  review HEP.   manual anmd modalities    PT Home Exercise Plan  cerv ical retraction    Consulted and Agree with Plan of Care  Patient       Patient will benefit from skilled therapeutic intervention in order to improve the following deficits and impairments:  Pain, Postural dysfunction, Increased muscle spasms, Decreased range of motion  Visit Diagnosis: Cervicalgia  Abnormal posture  Cramp and spasm  Stiffness of cervical spine     Problem List Patient Active Problem List   Diagnosis Date Noted  . Diabetes mellitus ([place]) 10/06/2019  . Spinal stenosis 10/06/2019  . History of gout 10/06/2019  . History of rheumatoid arthritis 10/06/2019    Steven Knapp  PT 11/22/2019, 2:59 PM  [facility] [address] [place], [place], [address] Phone: [phone]   Fax:  [phone]  Name: Steven Knapp MRN: [id number] Date of Birth: 17-Jun-1946

## 2019-11-23 LAB — HM DIABETES EYE EXAM

## 2019-11-24 ENCOUNTER — Telehealth: Payer: Self-pay | Admitting: *Deleted

## 2019-11-24 ENCOUNTER — Ambulatory Visit: Payer: Medicare Other | Admitting: Physical Therapy

## 2019-11-24 ENCOUNTER — Other Ambulatory Visit: Payer: Self-pay

## 2019-11-24 DIAGNOSIS — R252 Cramp and spasm: Secondary | ICD-10-CM

## 2019-11-24 DIAGNOSIS — M542 Cervicalgia: Secondary | ICD-10-CM | POA: Diagnosis not present

## 2019-11-24 DIAGNOSIS — M436 Torticollis: Secondary | ICD-10-CM

## 2019-11-24 DIAGNOSIS — R293 Abnormal posture: Secondary | ICD-10-CM

## 2019-11-24 NOTE — Telephone Encounter (Signed)
Faxed signed PT medical form with Dx: cervical, to [facility]. Confirmation page at 8:22 am.

## 2019-11-24 NOTE — Therapy (Signed)
[place], [place], [address] Phone: [phone]   Fax:  [phone]  Physical Therapy Treatment  Patient Details  Name: Steven Knapp MRN: [id number] Date of Birth: [birth year] Referring Provider (PT): Agustina Caroli, MD    Encounter Date: 11/24/2019  PT End of Session - 11/24/19 0833    Visit Number  2    Number of Visits  12    Date for PT Re-Evaluation  12/30/19    Authorization Type  UHC MCR    PT Start Time  0830    PT Stop Time  0910    PT Time Calculation (min)  40 min       Past Medical History:  Diagnosis Date  . Cataract   . Diabetes mellitus without complication ([place])     No past surgical history on file.  There were no vitals filed for this visit.  Subjective Assessment - 11/24/19 0837    Subjective  No pain in neck and back today but having knee pain. 3/10 due to the weather.    Currently in Pain?  Yes   3/10 bilat knees   Pain Score  0-No pain    Pain Location  Neck    Multiple Pain Sites  No                       [facility] Adult PT Treatment/Exercise - 11/24/19 0001      Neck Exercises: Machines for Strengthening   Nustep  L5 UE/LE x 6 minutes       Neck Exercises: Theraband   Shoulder Extension  20 reps   cues for posture and speed   Shoulder Extension Limitations  red    Rows  20 reps   cues for posture and speed    Rows Limitations  red      Neck Exercises: Seated   Other Seated Exercise  Sit-stand x 10 , needs elevation  (2 pillows from mat table)     Other Seated Exercise  Seated in chair thoracic extension x 10       Neck Exercises: Supine   Neck Retraction  10 reps    Neck Retraction Limitations  cues for technique       Neck Exercises: Stretches   Upper Trapezius Stretch  Right;Left;2 reps;20 seconds    Levator Stretch  Right;Left;2 reps    Corner Stretch Limitations  doorway stretch x 3 for 30 sec     Other Neck Stretches  Lower trunk  rotation with opposite head turn x 10              PT Education - 11/24/19 0919    Education Details  HEP    Person(s) Educated  Patient    Methods  Explanation;Handout    Comprehension  Verbalized understanding       PT Short Term Goals - 11/22/19 1354      PT SHORT TERM GOAL #1   Title  She will be independent with initial HEP    Time  2    Period  Weeks    Status  New      PT SHORT TERM GOAL #2   Title  He will report pain decreased 30% or more in neck and upper back    Time  3    Period  Weeks    Status  New      PT SHORT TERM GOAL #3   Title  He  will demo understanding of good sitting posture    Time  3    Period  Weeks    Status  New      PT SHORT TERM GOAL #4   Title  He will report improved strength and able to walk his dog a 1/4 mile without increased pain    Time  3    Period  Weeks    Status  New        PT Long Term Goals - 11/22/19 1403      PT LONG TERM GOAL #1   Title  He will be able to walk his dog a mile.    Time  6    Period  Weeks    Status  New      PT LONG TERM GOAL #2   Title  He will be able to drive as needed.  without pain    Time  6    Period  Weeks    Status  New      PT LONG TERM GOAL #3   Title  He will be independent with all hEP issued as of last session.    Time  6    Period  Weeks    Status  New      PT LONG TERM GOAL #4   Title  He will report pain decreased 75% or more and able to turn head to end range with 1-2 max pain    Time  6    Period  Weeks    Status  New      PT LONG TERM GOAL #5   Title  FOTO score will improve to 30% limited    Time  6    Period  Weeks    Status  New            Plan - 11/24/19 8315    Clinical Impression Statement  Pt arrives reporting no neck and back pain today. He notes 3/10 bilateral knee pain today due to the weather. He has difficulty rising from seated in the lobby. Began Nustep for ROM and warm up with cues for posture. Reviewed and progressed HEP scap stab and  trunk stretching. Also began sit-stands for LE strength. Afterward he reported feeling good.    PT Next Visit Plan  review HEP.   manual anmd modalities    PT Home Exercise Plan  cerv ical retraction, added LTR, red band rows and extension, sit-stand       Patient will benefit from skilled therapeutic intervention in order to improve the following deficits and impairments:  Pain, Postural dysfunction, Increased muscle spasms, Decreased range of motion  Visit Diagnosis: Cervicalgia  Abnormal posture  Cramp and spasm  Stiffness of cervical spine     Problem List Patient Active Problem List   Diagnosis Date Noted  . Diabetes mellitus (HCC) 10/06/2019  . Spinal stenosis 10/06/2019  . History of gout 10/06/2019  . History of rheumatoid arthritis 10/06/2019    Sherrie Mustache, PTA 11/24/2019, 9:24 AM  [facility] [address] [place], [place], [address] Phone: [phone]   Fax:  [phone]  Name: Steven Knapp MRN: [id number] Date of Birth: [birth year]

## 2019-11-29 ENCOUNTER — Ambulatory Visit: Payer: Medicare Other

## 2019-11-29 ENCOUNTER — Other Ambulatory Visit: Payer: Self-pay

## 2019-11-29 DIAGNOSIS — M436 Torticollis: Secondary | ICD-10-CM

## 2019-11-29 DIAGNOSIS — R293 Abnormal posture: Secondary | ICD-10-CM

## 2019-11-29 DIAGNOSIS — M542 Cervicalgia: Secondary | ICD-10-CM

## 2019-11-29 DIAGNOSIS — R252 Cramp and spasm: Secondary | ICD-10-CM

## 2019-11-29 NOTE — Therapy (Signed)
[place], [place], [address] Phone: [phone]   Fax:  [phone]  Physical Therapy Treatment  Patient Details  Name: Steven Knapp MRN: [id number] Date of Birth: 1946-08-16 Referring Provider (PT): Steven Caroli, MD    Encounter Date: 11/29/2019  PT End of Session - 11/29/19 0819    Visit Number  3    Number of Visits  12    Date for PT Re-Evaluation  12/30/19    Authorization Type  UHC MCR    PT Start Time  0820    PT Stop Time  0905    PT Time Calculation (min)  45 min    Activity Tolerance  Patient tolerated treatment well;Patient limited by pain    Behavior During Therapy  [facility] for tasks assessed/performed       Past Medical History:  Diagnosis Date  . Cataract   . Diabetes mellitus without complication ([place])     History reviewed. No pertinent surgical history.  There were no vitals filed for this visit.  Subjective Assessment - 11/29/19 0825    Subjective  No neck pain more Lt knee pain now    Currently in Pain?  No/denies    Pain Location  Neck    Pain Score  4    Pain Location  Knee    Pain Orientation  Left    Pain Descriptors / Indicators  Aching    Pain Type  Chronic pain    Pain Onset  More than a month ago    Pain Frequency  Constant                       [facility] Adult PT Treatment/Exercise - 11/29/19 0001      Neck Exercises: Machines for Strengthening   Nustep  L5 UE/LE x 10 minutes       Neck Exercises: Theraband   Other Theraband Exercises  supine green band x 15 hor abd and flexion with short pull      Knee/Hip Exercises: Supine   Short Arc Quad Sets  Both;3 sets;5 reps    Short Arc Quad Sets Limitations  with ball squeeze    Bridges  Both;20 reps    Single Leg Bridge  --    Other Supine Knee/Hip Exercises  ER with hooklye green band on knee sx 20 ball squeeze x 20       Knee/Hip Exercises: Sidelying   Hip ABduction  Right;Left;2 sets;10 reps     Clams  12 reps RT/LT       Modalities   Modalities  Cryotherapy      Cryotherapy   Number Minutes Cryotherapy  --    Cryotherapy Location  --    Type of Cryotherapy  --               PT Short Term Goals - 11/22/19 1354      PT SHORT TERM GOAL #1   Title  She will be independent with initial HEP    Time  2    Period  Weeks    Status  New      PT SHORT TERM GOAL #2   Title  He will report pain decreased 30% or more in neck and upper back    Time  3    Period  Weeks    Status  New      PT SHORT TERM GOAL #3   Title  He will demo  understanding of good sitting posture    Time  3    Period  Weeks    Status  New      PT SHORT TERM GOAL #4   Title  He will report improved strength and able to walk his dog a 1/4 mile without increased pain    Time  3    Period  Weeks    Status  New        PT Long Term Goals - 11/22/19 1403      PT LONG TERM GOAL #1   Title  He will be able to walk his dog a mile.    Time  6    Period  Weeks    Status  New      PT LONG TERM GOAL #2   Title  He will be able to drive as needed.  without pain    Time  6    Period  Weeks    Status  New      PT LONG TERM GOAL #3   Title  He will be independent with all hEP issued as of last session.    Time  6    Period  Weeks    Status  New      PT LONG TERM GOAL #4   Title  He will report pain decreased 75% or more and able to turn head to end range with 1-2 max pain    Time  6    Period  Weeks    Status  New      PT LONG TERM GOAL #5   Title  FOTO score will improve to 30% limited    Time  6    Period  Weeks    Status  New            Plan - 11/29/19 0903    Clinical Impression Statement  He reported no pain psot though Lt knee felt as he worked it.  We need to add to neck stab and Le strength.    PT Treatment/Interventions  Electrical Stimulation;Moist Heat;Traction;Ultrasound;Therapeutic exercise;Patient/family education;Passive range of motion;Manual techniques;Dry  needling    PT Next Visit Plan  review HEP add with band for UE and LE exercises.   manual and modalities if needed    PT Home Exercise Plan  cerv ical retraction, added LTR, red band rows and extension, sit-stand    Consulted and Agree with Plan of Care  Patient       Patient will benefit from skilled therapeutic intervention in order to improve the following deficits and impairments:  Pain, Postural dysfunction, Increased muscle spasms, Decreased range of motion  Visit Diagnosis: Cervicalgia  Abnormal posture  Cramp and spasm  Stiffness of cervical spine     Problem List Patient Active Problem List   Diagnosis Date Noted  . Diabetes mellitus (HCC) 10/06/2019  . Spinal stenosis 10/06/2019  . History of gout 10/06/2019  . History of rheumatoid arthritis 10/06/2019    Caprice Red  PT 11/29/2019, 9:06 AM  [facility] [address] [place], [place], [address] Phone: [phone]   Fax:  [phone]  Name: Steven Knapp MRN: [id number] Date of Birth: December 17, 1945

## 2019-11-30 ENCOUNTER — Telehealth: Payer: Self-pay | Admitting: *Deleted

## 2019-11-30 NOTE — Telephone Encounter (Signed)
Faxed signed orders to [facility] [phone] Attn: Orders Manager. Confirmation page at 8:30 am.

## 2019-11-30 NOTE — Telephone Encounter (Signed)
Entered in error

## 2019-12-05 ENCOUNTER — Telehealth: Payer: Self-pay | Admitting: Emergency Medicine

## 2019-12-05 NOTE — Telephone Encounter (Signed)
[facility] needs most recent labs for pt faxed to [phone].

## 2019-12-06 ENCOUNTER — Ambulatory Visit: Payer: Medicare Other

## 2019-12-06 ENCOUNTER — Ambulatory Visit: Payer: No Typology Code available for payment source | Admitting: Gastroenterology

## 2019-12-06 ENCOUNTER — Telehealth: Payer: Self-pay | Admitting: Physical Therapy

## 2019-12-06 NOTE — Telephone Encounter (Signed)
Unable to leave message or speak to  Mr Signor about cancellation.

## 2019-12-08 ENCOUNTER — Ambulatory Visit: Payer: Medicare Other

## 2019-12-13 ENCOUNTER — Other Ambulatory Visit: Payer: Self-pay

## 2019-12-13 ENCOUNTER — Ambulatory Visit: Payer: Medicare Other | Admitting: Physical Therapy

## 2019-12-13 ENCOUNTER — Encounter: Payer: Self-pay | Admitting: Physical Therapy

## 2019-12-13 DIAGNOSIS — R252 Cramp and spasm: Secondary | ICD-10-CM

## 2019-12-13 DIAGNOSIS — M542 Cervicalgia: Secondary | ICD-10-CM

## 2019-12-13 DIAGNOSIS — R293 Abnormal posture: Secondary | ICD-10-CM

## 2019-12-13 DIAGNOSIS — M436 Torticollis: Secondary | ICD-10-CM

## 2019-12-13 NOTE — Therapy (Signed)
[place], [place], [address] Phone: [phone]   Fax:  [phone]  Physical Therapy Treatment  Patient Details  Name: Steven Knapp MRN: [id number] Date of Birth: 02/29/1946 Referring Provider (PT): Agustina Caroli, MD    Encounter Date: 12/13/2019  PT End of Session - 12/13/19 0941    Visit Number  4    Number of Visits  12    Date for PT Re-Evaluation  12/30/19    Authorization Type  UHC MCR    PT Start Time  0939    PT Stop Time  1017    PT Time Calculation (min)  38 min    Activity Tolerance  Patient tolerated treatment well    Behavior During Therapy  [facility] for tasks assessed/performed       Past Medical History:  Diagnosis Date  . Cataract   . Diabetes mellitus without complication ([place])     History reviewed. No pertinent surgical history.  There were no vitals filed for this visit.  Subjective Assessment - 12/13/19 0941    Subjective  No neck pain today, just a little knee stiffness this morning.    Patient Stated Goals  He wants to regain strength and fucntionas normal as able without pain ache.    Currently in Pain?  No/denies         [facility] PT Assessment - 12/13/19 0001      Assessment   Medical Diagnosis  cervicalgia    Referring Provider (PT)  Agustina Caroli, MD     Onset Date/Surgical Date  --   08/2019   Next MD Visit  March 2021                   [facility] Adult PT Treatment/Exercise - 12/13/19 0001      Exercises   Exercises  Neck;Other Exercises    Other Exercises   gastroc, hip flexor, hamstrings      Neck Exercises: Machines for Strengthening   Nustep  L5 UE & LE 5 min      Neck Exercises: Supine   Neck Retraction  10 reps    Other Supine Exercise  T & diagonals red tband in hooklying      Neck Exercises: Sidelying   Other Sidelying Exercise  horiz abd red tband x20 each      Neck Exercises: Stretches   Other Neck Stretches  GHJ flx with wand    Other Neck Stretches  supine pec stretch               PT Short Term Goals - 12/13/19 1004      PT SHORT TERM GOAL #1   Title  She will be independent with initial HEP    Status  Achieved      PT SHORT TERM GOAL #2   Title  He will report pain decreased 30% or more in neck and upper back    Status  Achieved      PT SHORT TERM GOAL #3   Title  He will demo understanding of good sitting posture    Status  Achieved      PT SHORT TERM GOAL #4   Title  He will report improved strength and able to walk his dog a 1/4 mile without increased pain    Status  Achieved        PT Long Term Goals - 11/22/19 1403      PT LONG TERM GOAL #1  Title  He will be able to walk his dog a mile.    Time  6    Period  Weeks    Status  New      PT LONG TERM GOAL #2   Title  He will be able to drive as needed.  without pain    Time  6    Period  Weeks    Status  New      PT LONG TERM GOAL #3   Title  He will be independent with all hEP issued as of last session.    Time  6    Period  Weeks    Status  New      PT LONG TERM GOAL #4   Title  He will report pain decreased 75% or more and able to turn head to end range with 1-2 max pain    Time  6    Period  Weeks    Status  New      PT LONG TERM GOAL #5   Title  FOTO score will improve to 30% limited    Time  6    Period  Weeks    Status  New            Plan - 12/13/19 1321    Clinical Impression Statement  Pt is doing very well and denied pain or stiffness following PT today. Has one more visit in POC and will likely be ready for d/c to home program.    PT Treatment/Interventions  Electrical Stimulation;Moist Heat;Traction;Ultrasound;Therapeutic exercise;Patient/family education;Passive range of motion;Manual techniques;Dry needling    PT Next Visit Plan  determine need for continued PT vs HEP    PT Home Exercise Plan  cerv ical retraction, added LTR, red band rows and extension, sit-stand, stretches: gastroc, hip  flexors, hamstrings, piriformis    Consulted and Agree with Plan of Care  Patient       Patient will benefit from skilled therapeutic intervention in order to improve the following deficits and impairments:  Pain, Postural dysfunction, Increased muscle spasms, Decreased range of motion  Visit Diagnosis: Cervicalgia  Abnormal posture  Cramp and spasm  Stiffness of cervical spine     Problem List Patient Active Problem List   Diagnosis Date Noted  . Diabetes mellitus (HCC) 10/06/2019  . Spinal stenosis 10/06/2019  . History of gout 10/06/2019  . History of rheumatoid arthritis 10/06/2019   Ma Munoz C. Jamarquis Crull PT, DPT 12/13/19 1:27 PM   [facility] [address] [place], [place], [address] Phone: [phone]   Fax:  [phone]  Name: Steven Knapp MRN: [id number] Date of Birth: 04-07-1946

## 2019-12-15 ENCOUNTER — Other Ambulatory Visit: Payer: Self-pay

## 2019-12-15 ENCOUNTER — Ambulatory Visit: Payer: Medicare Other

## 2019-12-15 DIAGNOSIS — R293 Abnormal posture: Secondary | ICD-10-CM

## 2019-12-15 DIAGNOSIS — M542 Cervicalgia: Secondary | ICD-10-CM | POA: Diagnosis not present

## 2019-12-15 DIAGNOSIS — M436 Torticollis: Secondary | ICD-10-CM

## 2019-12-15 DIAGNOSIS — R252 Cramp and spasm: Secondary | ICD-10-CM

## 2019-12-15 NOTE — Therapy (Signed)
Steven Knapp, [place], [address] Phone: [phone]   Fax:  [phone]  Physical Therapy Treatment/Discharge  Patient Details  Name: Steven Knapp MRN: [id number] Date of Birth: 1946/03/08 Referring Provider (PT): Steven Caroli, MD    Encounter Date: 12/15/2019  PT End of Session - 12/15/19 1112    Visit Number  5    Number of Visits  12    Date for PT Re-Evaluation  12/30/19    Authorization Type  [employer] MCR    PT Start Time  1130    PT Stop Time  1200    PT Time Calculation (min)  30 min    Activity Tolerance  Patient tolerated treatment well    Behavior During Therapy  [facility] for tasks assessed/performed       Past Medical History:  Diagnosis Date  . Cataract   . Diabetes mellitus without complication ([place])     History reviewed. No pertinent surgical history.  There were no vitals filed for this visit.  Subjective Assessment - 12/15/19 1136    Subjective  no pain now.    Currently in Pain?  No/denies            HEP was reviewed and blue and black band issued . He was cautioned how to progress HEP without causing pain and excess strain . We discussed return to PT if he had future problems                      PT Short Term Goals - 12/13/19 1004      PT SHORT TERM GOAL #1   Title  She will be independent with initial HEP    Status  Achieved      PT SHORT TERM GOAL #2   Title  He will report pain decreased 30% or more in neck and upper back    Status  Achieved      PT SHORT TERM GOAL #3   Title  He will demo understanding of good sitting posture    Status  Achieved      PT SHORT TERM GOAL #4   Title  He will report improved strength and able to walk his dog a 1/4 mile without increased pain    Status  Achieved        PT Long Term Goals - 12/15/19 1204      PT LONG TERM GOAL #1   Title  He will be able to walk his dog a mile.    Status  Achieved      PT LONG  TERM GOAL #2   Title  He will be able to drive as needed.  without pain    Status  Achieved      PT LONG TERM GOAL #3   Title  He will be independent with all hEP issued as of last session.    Status  Achieved      PT LONG TERM GOAL #4   Title  He will report pain decreased 75% or more and able to turn head to end range with 1-2 max pain    Status  Achieved      PT LONG TERM GOAL #5   Title  FOTO score will improve to 30% limited    Baseline  31%    Status  Partially Met            Plan - 12/15/19 1202    Clinical Impression  Statement  [name] felt he ws doing well and that he HEP was getting the results he wanted. He has not had pain for 2 weeks ( except the back flareup)   He felt he was ready for discharge    PT Treatment/Interventions  Electrical Stimulation;Moist Heat;Traction;Ultrasound;Therapeutic exercise;Patient/family education;Passive range of motion;Manual techniques;Dry needling    PT Next Visit Plan  Discharge with HEP    PT Home Exercise Plan  cerv ical retraction, added LTR, red band rows and extension, sit-stand, stretches: gastroc, hip flexors, hamstrings, piriformis    Consulted and Agree with Plan of Care  Patient       Patient will benefit from skilled therapeutic intervention in order to improve the following deficits and impairments:  Pain, Postural dysfunction, Increased muscle spasms, Decreased range of motion  Visit Diagnosis: Cervicalgia  Abnormal posture  Cramp and spasm  Stiffness of cervical spine     Problem List Patient Active Problem List   Diagnosis Date Noted  . Diabetes mellitus ([place]) 10/06/2019  . Spinal stenosis 10/06/2019  . History of gout 10/06/2019  . History of rheumatoid arthritis 10/06/2019    Darrel Hoover  PT 12/15/2019, 12:10 PM  [facility] [address] [place], [place], [address] Phone: [phone]   Fax:  [phone]  Name: Steven Knapp MRN:  [id number] Date of Birth: 06-Mar-1946 PHYSICAL THERAPY DISCHARGE SUMMARY  Visits from Start of Care: 5  Current functional level related to goals / functional outcomes: No pain and full function   Remaining deficits: None   Education / Equipment: HEP Plan: Patient agrees to discharge.  Patient goals were partially met. Patient is being discharged due to meeting the stated rehab goals.  ?????

## 2019-12-20 ENCOUNTER — Other Ambulatory Visit: Payer: Self-pay | Admitting: Emergency Medicine

## 2019-12-20 MED ORDER — COLCRYS 0.6 MG PO TABS: 0.6000 mg | ORAL_TABLET | Freq: Two times a day (BID) | ORAL | 0 refills | Status: DC

## 2019-12-20 MED ORDER — GLIPIZIDE 5 MG PO TABS: 5.0000 mg | ORAL_TABLET | Freq: Every day | ORAL | 0 refills | Status: DC

## 2019-12-20 NOTE — Telephone Encounter (Signed)
Requested medication (s) are due for refill today: previous amt dispensed not specified  Requested medication (s) are on the active medication list: both are historical meds  Last refill:  Glipizide: 07/19/19   Colcrys: 10/31/19  Future visit scheduled: yes  Notes to clinic:  historical medications and provider   Requested Prescriptions  Pending Prescriptions Disp Refills   glipiZIDE (GLUCOTROL) 5 MG tablet      Sig: Take 1 tablet (5 mg total) by mouth daily before breakfast.      Endocrinology:  Diabetes - Sulfonylureas Passed - 12/20/2019  3:56 PM      Passed - HBA1C is between 0 and 7.9 and within 180 days    Hemoglobin A1C  Date Value Ref Range Status  10/06/2019 7.6 (A) 4.0 - 5.6 % Final          Passed - Valid encounter within last 6 months    Recent Outpatient Visits           1 month ago Anemia, unspecified type   Primary Care at [facility], [place], [place]   2 months ago Type 2 diabetes mellitus with other specified complication, without long-term current use of insulin [facility])   Primary Care at [facility], Eilleen Kempf, MD       Future Appointments             In 1 month Sagardia, Eilleen Kempf, MD Primary Care at [place], [facility]   In 3 months Sagardia, Eilleen Kempf, MD Primary Care at [place], [facility]              COLCRYS 0.6 MG tablet      Sig: Take 1 tablet (0.6 mg total) by mouth 2 (two) times daily.      Endocrinology:  Gout Agents Failed - 12/20/2019  3:56 PM      Failed - Uric Acid in normal range and within 360 days    No results found for: POCURA, LABURIC        Failed - Cr in normal range and within 360 days    Creatinine, Ser  Date Value Ref Range Status  10/06/2019 1.37 (H) 0.76 - 1.27 mg/dL Final          Passed - Valid encounter within last 12 months    Recent Outpatient Visits           1 month ago Anemia, unspecified type   Primary Care at [facility], [place], [place]   2 months ago Type 2 diabetes mellitus with other specified  complication, without long-term current use of insulin [facility])   Primary Care at [facility], Eilleen Kempf, MD       Future Appointments             In 1 month Sagardia, Eilleen Kempf, MD Primary Care at [place], [place]   In 3 months Sagardia, Eilleen Kempf, MD Primary Care at [place], [facility]

## 2019-12-20 NOTE — Telephone Encounter (Signed)
Medication Refill - Medication: glipiZIDE (GLUCOTROL) 5 MG tablet  COLCRYS 0.6 MG tablet   Has the patient contacted their pharmacy? Yes.   (Agent: If no, request that the patient contact the pharmacy for the refill.) (Agent: If yes, when and what did the pharmacy advise?)  Preferred Pharmacy (with phone number or street name):  [facility] [id number], [place] - [address]  [address] [place] [address]  Phone: [phone] Fax: [phone]     Agent: Please be advised that RX refills may take up to 3 business days. We ask that you follow-up with your pharmacy.

## 2020-01-09 ENCOUNTER — Telehealth: Payer: Self-pay | Admitting: Emergency Medicine

## 2020-01-09 NOTE — Telephone Encounter (Signed)
What is the name of the medication? glipiZIDE (GLUCOTROL) 5 MG tablet [[id number]]   Freestyle patch glucose meter   COLCRYS 0.6 MG tablet [[id number]]    Have you contacted your pharmacy to request a refill? yes  Which pharmacy would you like this sent to? [facility]   Patient notified that their request is being sent to the clinical staff for review and that they should receive a call once it is complete. If they do not receive a call within 72 hours they can check with their pharmacy or our office.

## 2020-01-11 ENCOUNTER — Other Ambulatory Visit: Payer: Self-pay

## 2020-01-11 ENCOUNTER — Telehealth: Payer: Self-pay | Admitting: *Deleted

## 2020-01-11 ENCOUNTER — Other Ambulatory Visit: Payer: Self-pay | Admitting: *Deleted

## 2020-01-11 DIAGNOSIS — E1169 Type 2 diabetes mellitus with other specified complication: Secondary | ICD-10-CM

## 2020-01-11 DIAGNOSIS — Z8739 Personal history of other diseases of the musculoskeletal system and connective tissue: Secondary | ICD-10-CM

## 2020-01-11 MED ORDER — COLCRYS 0.6 MG PO TABS: 0.6000 mg | ORAL_TABLET | Freq: Two times a day (BID) | ORAL | 0 refills | Status: DC

## 2020-01-11 MED ORDER — GLIPIZIDE 5 MG PO TABS: 5.0000 mg | ORAL_TABLET | Freq: Every day | ORAL | 0 refills | Status: DC

## 2020-01-11 NOTE — Telephone Encounter (Signed)
Returned patient call regarding MRI results from 07/2019. Per patient he did get the results from ER at that time. He would like a call or my-chart message with this information.

## 2020-01-11 NOTE — Telephone Encounter (Signed)
Called [facility] pharmacy spoke to [place] (pharmacist) to cancel Rx for Colcrys 0.6 mg, patient wants Rx sent to [facility]. Prescription reordered to pharmacy requested.

## 2020-01-12 NOTE — Telephone Encounter (Signed)
Seen in the emergency department on 07/19/2019 complaining of neck and upper back pain.  Cervical spine MRI showed underlying extensive degenerative changes. Patient's cervical spine x-rays and MRI do show significant signs of arthritis and cervical spine stenosis.  The emergency room physician spoke to the neurosurgeon on call to arrange for follow-up visit.  Did he follow-up with the neurosurgeon?  If he did not, he then needs to.  Call patient please.  Thanks.

## 2020-01-17 ENCOUNTER — Encounter: Payer: Self-pay | Admitting: *Deleted

## 2020-01-18 ENCOUNTER — Telehealth: Payer: Self-pay

## 2020-01-18 NOTE — Telephone Encounter (Signed)
Tried calling patient to inform per drs notes on my-chart message to patient see note 01/12/20. Patient is scheduled 01/30/20

## 2020-01-23 ENCOUNTER — Telehealth: Payer: Self-pay

## 2020-01-23 NOTE — Telephone Encounter (Signed)
Pt crm closed, no action needed

## 2020-01-30 ENCOUNTER — Ambulatory Visit: Payer: Medicare Other | Admitting: Emergency Medicine

## 2020-01-31 ENCOUNTER — Other Ambulatory Visit: Payer: Self-pay

## 2020-01-31 ENCOUNTER — Ambulatory Visit (INDEPENDENT_AMBULATORY_CARE_PROVIDER_SITE_OTHER): Payer: Medicare Other | Admitting: Emergency Medicine

## 2020-01-31 ENCOUNTER — Encounter: Payer: Self-pay | Admitting: Emergency Medicine

## 2020-01-31 VITALS — BP 126/70 | HR 54 | Temp 97.8°F | Resp 16 | Ht 74.0 in | Wt 200.0 lb

## 2020-01-31 DIAGNOSIS — Z8739 Personal history of other diseases of the musculoskeletal system and connective tissue: Secondary | ICD-10-CM

## 2020-01-31 DIAGNOSIS — E1165 Type 2 diabetes mellitus with hyperglycemia: Secondary | ICD-10-CM

## 2020-01-31 DIAGNOSIS — M2550 Pain in unspecified joint: Secondary | ICD-10-CM

## 2020-01-31 DIAGNOSIS — Z1159 Encounter for screening for other viral diseases: Secondary | ICD-10-CM | POA: Diagnosis not present

## 2020-01-31 DIAGNOSIS — E1169 Type 2 diabetes mellitus with other specified complication: Secondary | ICD-10-CM | POA: Diagnosis not present

## 2020-01-31 LAB — POCT GLYCOSYLATED HEMOGLOBIN (HGB A1C): Hemoglobin A1C: 7 % — AB (ref 4.0–5.6)

## 2020-01-31 LAB — GLUCOSE, POCT (MANUAL RESULT ENTRY): POC Glucose: 107 mg/dl — AB (ref 70–99)

## 2020-01-31 MED ORDER — FREESTYLE LIBRE SENSOR SYSTEM MISC: 1.0000 | Freq: Every day | 0 refills | Status: DC

## 2020-01-31 MED ORDER — COLCRYS 0.6 MG PO TABS: 0.6000 mg | ORAL_TABLET | Freq: Two times a day (BID) | ORAL | 3 refills | Status: DC

## 2020-01-31 MED ORDER — HYDROCODONE-ACETAMINOPHEN 5-325 MG PO TABS: 1.0000 | ORAL_TABLET | Freq: Four times a day (QID) | ORAL | 0 refills | Status: DC | PRN

## 2020-01-31 MED ORDER — GLIPIZIDE 5 MG PO TABS: 5.0000 mg | ORAL_TABLET | Freq: Every day | ORAL | 3 refills | Status: DC

## 2020-01-31 NOTE — Patient Instructions (Addendum)
   If you have lab work done today you will be contacted with your lab results within the next 2 weeks.  If you have not heard from us then please contact us. The fastest way to get your results is to register for My Chart.   IF you received an x-ray today, you will receive an invoice from [facility]. Please contact [facility] at [phone] with questions or concerns regarding your invoice.   IF you received labwork today, you will receive an invoice from [facility]. Please contact [facility] at [phone] with questions or concerns regarding your invoice.   Our billing staff will not be able to assist you with questions regarding bills from these companies.  You will be contacted with the lab results as soon as they are available. The fastest way to get your results is to activate your My Chart account. Instructions are located on the last page of this paperwork. If you have not heard from us regarding the results in 2 weeks, please contact this office.      Diabetes Mellitus and Nutrition, Adult When you have diabetes (diabetes mellitus), it is very important to have healthy eating habits because your blood sugar (glucose) levels are greatly affected by what you eat and drink. Eating healthy foods in the appropriate amounts, at about the same times every day, can help you:  Control your blood glucose.  Lower your risk of heart disease.  Improve your blood pressure.  Reach or maintain a healthy weight. Every person with diabetes is different, and each person has different needs for a meal plan. Your health care provider may recommend that you work with a diet and nutrition specialist (dietitian) to make a meal plan that is best for you. Your meal plan may vary depending on factors such as:  The calories you need.  The medicines you take.  Your weight.  Your blood glucose, blood pressure, and cholesterol levels.  Your activity level.  Other health  conditions you have, such as heart or kidney disease. How do carbohydrates affect me? Carbohydrates, also called carbs, affect your blood glucose level more than any other type of food. Eating carbs naturally raises the amount of glucose in your blood. Carb counting is a method for keeping track of how many carbs you eat. Counting carbs is important to keep your blood glucose at a healthy level, especially if you use insulin or take certain oral diabetes medicines. It is important to know how many carbs you can safely have in each meal. This is different for every person. Your dietitian can help you calculate how many carbs you should have at each meal and for each snack. Foods that contain carbs include:  Bread, cereal, rice, pasta, and crackers.  Potatoes and corn.  Peas, beans, and lentils.  Milk and yogurt.  Fruit and juice.  Desserts, such as cakes, cookies, ice cream, and candy. How does alcohol affect me? Alcohol can cause a sudden decrease in blood glucose (hypoglycemia), especially if you use insulin or take certain oral diabetes medicines. Hypoglycemia can be a life-threatening condition. Symptoms of hypoglycemia (sleepiness, dizziness, and confusion) are similar to symptoms of having too much alcohol. If your health care provider says that alcohol is safe for you, follow these guidelines:  Limit alcohol intake to no more than 1 drink per day for nonpregnant women and 2 drinks per day for men. One drink equals 12 oz of beer, 5 oz of wine, or 1 oz of hard   liquor.  Do not drink on an empty stomach.  Keep yourself hydrated with water, diet soda, or unsweetened iced tea.  Keep in mind that regular soda, juice, and other mixers may contain a lot of sugar and must be counted as carbs. What are tips for following this plan?  Reading food labels  Start by checking the serving size on the "Nutrition Facts" label of packaged foods and drinks. The amount of calories, carbs, fats, and  other nutrients listed on the label is based on one serving of the item. Many items contain more than one serving per package.  Check the total grams (g) of carbs in one serving. You can calculate the number of servings of carbs in one serving by dividing the total carbs by 15. For example, if a food has 30 g of total carbs, it would be equal to 2 servings of carbs.  Check the number of grams (g) of saturated and trans fats in one serving. Choose foods that have low or no amount of these fats.  Check the number of milligrams (mg) of salt (sodium) in one serving. Most people should limit total sodium intake to less than 2,300 mg per day.  Always check the nutrition information of foods labeled as "low-fat" or "nonfat". These foods may be higher in added sugar or refined carbs and should be avoided.  Talk to your dietitian to identify your daily goals for nutrients listed on the label. Shopping  Avoid buying canned, premade, or processed foods. These foods tend to be high in fat, sodium, and added sugar.  Shop around the outside edge of the grocery store. This includes fresh fruits and vegetables, bulk grains, fresh meats, and fresh dairy. Cooking  Use low-heat cooking methods, such as baking, instead of high-heat cooking methods like deep frying.  Cook using healthy oils, such as olive, canola, or sunflower oil.  Avoid cooking with butter, cream, or high-fat meats. Meal planning  Eat meals and snacks regularly, preferably at the same times every day. Avoid going long periods of time without eating.  Eat foods high in fiber, such as fresh fruits, vegetables, beans, and whole grains. Talk to your dietitian about how many servings of carbs you can eat at each meal.  Eat 4-6 ounces (oz) of lean protein each day, such as lean meat, chicken, fish, eggs, or tofu. One oz of lean protein is equal to: ? 1 oz of meat, chicken, or fish. ? 1 egg. ?  cup of tofu.  Eat some foods each day that  contain healthy fats, such as avocado, nuts, seeds, and fish. Lifestyle  Check your blood glucose regularly.  Exercise regularly as told by your health care provider. This may include: ? 150 minutes of moderate-intensity or vigorous-intensity exercise each week. This could be brisk walking, biking, or water aerobics. ? Stretching and doing strength exercises, such as yoga or weightlifting, at least 2 times a week.  Take medicines as told by your health care provider.  Do not use any products that contain nicotine or tobacco, such as cigarettes and e-cigarettes. If you need help quitting, ask your health care provider.  Work with a counselor or diabetes educator to identify strategies to manage stress and any emotional and social challenges. Questions to ask a health care provider  Do I need to meet with a diabetes educator?  Do I need to meet with a dietitian?  What number can I call if I have questions?  When are the best   times to check my blood glucose? Where to find more information:  American Diabetes Association: diabetes.org  Academy of Nutrition and Dietetics: www.eatright.org  National Institute of Diabetes and Digestive and Kidney Diseases (NIH): www.niddk.nih.gov Summary  A healthy meal plan will help you control your blood glucose and maintain a healthy lifestyle.  Working with a diet and nutrition specialist (dietitian) can help you make a meal plan that is best for you.  Keep in mind that carbohydrates (carbs) and alcohol have immediate effects on your blood glucose levels. It is important to count carbs and to use alcohol carefully. This information is not intended to replace advice given to you by your health care provider. Make sure you discuss any questions you have with your health care provider. Document Revised: 10/16/2017 Document Reviewed: 12/08/2016 Elsevier Patient Education  2020 Elsevier Inc.  

## 2020-01-31 NOTE — Assessment & Plan Note (Signed)
Hemoglobin A1c better than last time at 7.0.  Patient only taking glipizide 5 mg in the morning.  Has improved his diet and nutrition.  Continue present regimen.  No changes.

## 2020-01-31 NOTE — Progress Notes (Signed)
Steven Knapp 74 y.o.   Chief Complaint  Patient presents with  . Medication Refill    colchicine,glipizide and hydrocodone-actea and review MRI results    HISTORY OF PRESENT ILLNESS: This is a 74 y.o. male with a chronic medical problems here for follow-up and medication refill. #1 diabetes: Only taking glipizide 5 mg in the morning along with diet and nutrition. #2 history of spinal stenosis and arthralgias of multiple joints #3 history of gout Doing well.  Has no acute complaints. MRI of the cervical spine done last September 2020 reviewed with patient. Vaccinated against Covid.  HPI   Prior to Admission medications   Medication Sig Start Date End Date Taking? Authorizing Provider  clobetasol cream (TEMOVATE) 0.05 % Apply 1 application topically 2 (two) times daily.   Yes [provider]  COLCRYS 0.6 MG tablet Take 1 tablet (0.6 mg total) by mouth 2 (two) times daily. 01/11/20  Yes Cephas Revard, Eilleen Kempf, MD  Dulaglutide (TRULICITY) 0.75 MG/0.5ML SOPN Inject 0.75 mg into the skin once a week. 10/07/19  Yes Johnnie Goynes, Eilleen Kempf, MD  glipiZIDE (GLUCOTROL) 5 MG tablet Take 1 tablet (5 mg total) by mouth daily before breakfast. 01/11/20  Yes Marcus Schwandt, Eilleen Kempf, MD  hydrochlorothiazide (HYDRODIURIL) 25 MG tablet hydrochlorothiazide 25 mg tablet   Yes [provider]  HYDROcodone-acetaminophen (NORCO/VICODIN) 5-325 MG tablet Take 1 tablet by mouth every 6 (six) hours as needed for severe pain. 10/12/19  Yes Robinson, [place] N, PA-C  indomethacin (INDOCIN) 50 MG capsule Take 1 capsule (50 mg total) by mouth 2 (two) times daily with a meal. 10/12/19  Yes Roxan Hockey, [place] N, PA-C  SYSTANE ULTRA 0.4-0.3 % SOLN SMARTSIG:1 Drop(s) In Eye(s) As Needed 11/23/19  Yes [provider]  timolol (TIMOPTIC) 0.5 % ophthalmic solution  12/26/19  Yes [provider]  glimepiride (AMARYL) 2 MG tablet glimepiride 2 mg tablet    [provider]  glimepiride (AMARYL)  4 MG tablet glimepiride 4 mg tablet    [provider]  ibuprofen (ADVIL) 200 MG tablet Take 200 mg by mouth every 6 (six) hours as needed for moderate pain.    [provider]  Lactobacillus (PROBIOTIC ACIDOPHILUS PO) Take 1 capsule by mouth daily.    [provider]  predniSONE (STERAPRED UNI-PAK 21 TAB) 10 MG (21) TBPK tablet Take 6 tabs by mouth daily  for 2 days, then 5 tabs for 2 days, then 4 tabs for 2 days, then 3 tabs for 2 days, 2 tabs for 2 days, then 1 tab by mouth daily for 2 days Patient not taking: Reported on 01/31/2020 07/19/19   Linwood Dibbles, MD  rosuvastatin (CRESTOR) 10 MG tablet Take 1 tablet (10 mg total) by mouth daily. Patient not taking: Reported on 01/31/2020 10/07/19   Georgina Quint, MD    Allergies  Allergen Reactions  . Other Hives and Itching    Peanuts, strawberries, pickles, wine, and corn    Patient Active Problem List   Diagnosis Date Noted  . Diabetes mellitus (HCC) 10/06/2019  . Spinal stenosis 10/06/2019  . History of gout 10/06/2019  . History of rheumatoid arthritis 10/06/2019  . 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 Gout 09/16/2018    Past Medical History:  Diagnosis Date  . Cataract   . Diabetes mellitus without complication (HCC)     History reviewed. No pertinent surgical history.  Social History   Socioeconomic History  . Marital status: Married    Spouse name: Not on file  .  Number of children: Not on file  . Years of education: Not on file  . Highest education level: Not on file  Occupational History  . Not on file  Tobacco Use  . Smoking status: Former Smoker    Years: 4.00    Types: Cigarettes  . Smokeless tobacco: Never Used  Substance and Sexual Activity  . Alcohol use: Yes    Alcohol/week: 3.0 standard drinks    Types: 3 Standard drinks or equivalent per week  . Drug use: Never  . Sexual activity: Not on file  Other Topics Concern  . Not on file  Social History Narrative  . Not on  file   Social Determinants of Health   Financial Resource Strain:   . Difficulty of Paying Living Expenses:   Food Insecurity:   . Worried About Programme researcher, broadcasting/film/video in the Last Year:   . Barista in the Last Year:   Transportation Needs:   . Freight forwarder (Medical):   Marland Kitchen Lack of Transportation (Non-Medical):   Physical Activity:   . Days of Exercise per Week:   . Minutes of Exercise per Session:   Stress:   . Feeling of Stress :   Social Connections:   . Frequency of Communication with Friends and Family:   . Frequency of Social Gatherings with Friends and Family:   . Attends Religious Services:   . Active Member of Clubs or Organizations:   . Attends Banker Meetings:   Marland Kitchen Marital Status:   Intimate Partner Violence:   . Fear of Current or Ex-Partner:   . Emotionally Abused:   Marland Kitchen Physically Abused:   . Sexually Abused:     Family History  Problem Relation Age of Onset  . Diabetes Mother   . Hypertension Mother      Review of Systems  Constitutional: Negative.  Negative for chills and fever.  HENT: Negative.  Negative for congestion and sore throat.   Respiratory: Negative.  Negative for cough and shortness of breath.   Cardiovascular: Negative.  Negative for chest pain and palpitations.  Gastrointestinal: Negative.  Negative for abdominal pain, blood in stool, diarrhea, melena, nausea and vomiting.  Genitourinary: Negative.  Negative for dysuria and hematuria.  Musculoskeletal: Positive for back pain, joint pain and neck pain.  Skin: Negative.  Negative for rash.  Neurological: Negative.  Negative for dizziness, focal weakness, seizures, loss of consciousness and headaches.  All other systems reviewed and are negative.  Vitals:   01/31/20 1429  BP: 126/70  Pulse: (!) 54  Resp: 16  Temp: 97.8 F (36.6 C)  SpO2: 100%     Physical Exam Vitals reviewed.  Constitutional:      Appearance: Normal appearance.  HENT:     Head:  Normocephalic.  Eyes:     Extraocular Movements: Extraocular movements intact.     Conjunctiva/sclera: Conjunctivae normal.     Pupils: Pupils are equal, round, and reactive to light.  Cardiovascular:     Rate and Rhythm: Normal rate and regular rhythm.     Pulses: Normal pulses.     Heart sounds: Normal heart sounds.  Pulmonary:     Effort: Pulmonary effort is normal.     Breath sounds: Normal breath sounds.  Abdominal:     Palpations: Abdomen is soft.     Tenderness: There is no abdominal tenderness.  Musculoskeletal:        General: Normal range of motion.     Cervical back:  No tenderness.  Skin:    General: Skin is warm and dry.     Capillary Refill: Capillary refill takes less than 2 seconds.  Neurological:     General: No focal deficit present.     Mental Status: He is alert and oriented to person, place, and time.  Psychiatric:        Mood and Affect: Mood normal.        Behavior: Behavior normal.    Results for orders placed or performed in visit on 01/31/20 (from the past 24 hour(s))  POCT glucose (manual entry)     Status: Abnormal   Collection Time: 01/31/20  2:56 PM  Result Value Ref Range   POC Glucose 107 (A) 70 - 99 mg/dl  POCT glycosylated hemoglobin (Hb A1C)     Status: Abnormal   Collection Time: 01/31/20  3:01 PM  Result Value Ref Range   Hemoglobin A1C 7.0 (A) 4.0 - 5.6 %   HbA1c POC (<> result, manual entry)     HbA1c, POC (prediabetic range)     HbA1c, POC (controlled diabetic range)     A total of 30 minutes was spent with the patient, greater than 50% of which was in counseling/coordination of care regarding chronic medical problems, treatment including medications diet and nutrition, review of most recent office visit notes, review of most recent blood work results including today's A1c, prognosis and need for follow-up.   ASSESSMENT & PLAN: Diabetes mellitus (HCC) Hemoglobin A1c better than last time at 7.0.  Patient only taking glipizide 5 mg  in the morning.  Has improved his diet and nutrition.  Continue present regimen.  No changes.  [place] was seen today for medication refill.  Diagnoses and all orders for this visit:  Type 2 diabetes mellitus with other specified complication, without long-term current use of insulin (HCC) -     POCT glucose (manual entry) -     POCT glycosylated hemoglobin (Hb A1C) -     CBC with Differential/Platelet -     Comprehensive metabolic panel -     Microalbumin, urine -     HM Diabetes Foot Exam -     glipiZIDE (GLUCOTROL) 5 MG tablet; Take 1 tablet (5 mg total) by mouth daily with breakfast.  Encounter for hepatitis C screening test for low risk patient  Uncontrolled type 2 diabetes mellitus with hyperglycemia (HCC) -     Lipid panel  History of gout -     COLCRYS 0.6 MG tablet; Take 1 tablet (0.6 mg total) by mouth 2 (two) times daily.  History of spinal stenosis  Arthralgia of multiple joints  Other orders -     HYDROcodone-acetaminophen (NORCO/VICODIN) 5-325 MG tablet; Take 1 tablet by mouth every 6 (six) hours as needed for severe pain.    Patient Instructions       If you have lab work done today you will be contacted with your lab results within the next 2 weeks.  If you have not heard from [place] then please contact us. The fastest way to get your results is to register for My Chart.   IF you received an x-ray today, you will receive an invoice from [facility]. Please contact [facility] at [phone] with questions or concerns regarding your invoice.   IF you received labwork today, you will receive an invoice from [facility]. Please contact [facility] at [phone] with questions or concerns regarding your invoice.   Our billing staff will not be able to  assist you with questions regarding bills from these companies.  You will be contacted with the lab results as soon as they are available. The fastest way to get your results is to activate your My  Chart account. Instructions are located on the last page of this paperwork. If you have not heard from [place] regarding the results in 2 weeks, please contact this office.     Diabetes Mellitus and Nutrition, Adult When you have diabetes (diabetes mellitus), it is very important to have healthy eating habits because your blood sugar (glucose) levels are greatly affected by what you eat and drink. Eating healthy foods in the appropriate amounts, at about the same times every day, can help you:  Control your blood glucose.  Lower your risk of heart disease.  Improve your blood pressure.  Reach or maintain a healthy weight. Every person with diabetes is different, and each person has different needs for a meal plan. Your health care provider may recommend that you work with a diet and nutrition specialist (dietitian) to make a meal plan that is best for you. Your meal plan may vary depending on factors such as:  The calories you need.  The medicines you take.  Your weight.  Your blood glucose, blood pressure, and cholesterol levels.  Your activity level.  Other health conditions you have, such as heart or kidney disease. How do carbohydrates affect me? Carbohydrates, also called carbs, affect your blood glucose level more than any other type of food. Eating carbs naturally raises the amount of glucose in your blood. Carb counting is a method for keeping track of how many carbs you eat. Counting carbs is important to keep your blood glucose at a healthy level, especially if you use insulin or take certain oral diabetes medicines. It is important to know how many carbs you can safely have in each meal. This is different for every person. Your dietitian can help you calculate how many carbs you should have at each meal and for each snack. Foods that contain carbs include:  Bread, cereal, rice, pasta, and crackers.  Potatoes and corn.  Peas, beans, and lentils.  Milk and yogurt.  Fruit  and juice.  Desserts, such as cakes, cookies, ice cream, and candy. How does alcohol affect me? Alcohol can cause a sudden decrease in blood glucose (hypoglycemia), especially if you use insulin or take certain oral diabetes medicines. Hypoglycemia can be a life-threatening condition. Symptoms of hypoglycemia (sleepiness, dizziness, and confusion) are similar to symptoms of having too much alcohol. If your health care provider says that alcohol is safe for you, follow these guidelines:  Limit alcohol intake to no more than 1 drink per day for nonpregnant women and 2 drinks per day for men. One drink equals 12 oz of beer, 5 oz of wine, or 1 oz of hard liquor.  Do not drink on an empty stomach.  Keep yourself hydrated with water, diet soda, or unsweetened iced tea.  Keep in mind that regular soda, juice, and other mixers may contain a lot of sugar and must be counted as carbs. What are tips for following this plan?  Reading food labels  Start by checking the serving size on the "Nutrition Facts" label of packaged foods and drinks. The amount of calories, carbs, fats, and other nutrients listed on the label is based on one serving of the item. Many items contain more than one serving per package.  Check the total grams (g) of carbs in one serving.  You can calculate the number of servings of carbs in one serving by dividing the total carbs by 15. For example, if a food has 30 g of total carbs, it would be equal to 2 servings of carbs.  Check the number of grams (g) of saturated and trans fats in one serving. Choose foods that have low or no amount of these fats.  Check the number of milligrams (mg) of salt (sodium) in one serving. Most people should limit total sodium intake to less than 2,300 mg per day.  Always check the nutrition information of foods labeled as "low-fat" or "nonfat". These foods may be higher in added sugar or refined carbs and should be avoided.  Talk to your dietitian  to identify your daily goals for nutrients listed on the label. Shopping  Avoid buying canned, premade, or processed foods. These foods tend to be high in fat, sodium, and added sugar.  Shop around the outside edge of the grocery store. This includes fresh fruits and vegetables, bulk grains, fresh meats, and fresh dairy. Cooking  Use low-heat cooking methods, such as baking, instead of high-heat cooking methods like deep frying.  Cook using healthy oils, such as olive, canola, or sunflower oil.  Avoid cooking with butter, cream, or high-fat meats. Meal planning  Eat meals and snacks regularly, preferably at the same times every day. Avoid going long periods of time without eating.  Eat foods high in fiber, such as fresh fruits, vegetables, beans, and whole grains. Talk to your dietitian about how many servings of carbs you can eat at each meal.  Eat 4-6 ounces (oz) of lean protein each day, such as lean meat, chicken, fish, eggs, or tofu. One oz of lean protein is equal to: ? 1 oz of meat, chicken, or fish. ? 1 egg. ?  cup of tofu.  Eat some foods each day that contain healthy fats, such as avocado, nuts, seeds, and fish. Lifestyle  Check your blood glucose regularly.  Exercise regularly as told by your health care provider. This may include: ? 150 minutes of moderate-intensity or vigorous-intensity exercise each week. This could be brisk walking, biking, or water aerobics. ? Stretching and doing strength exercises, such as yoga or weightlifting, at least 2 times a week.  Take medicines as told by your health care provider.  Do not use any products that contain nicotine or tobacco, such as cigarettes and e-cigarettes. If you need help quitting, ask your health care provider.  Work with a Veterinary surgeon or diabetes educator to identify strategies to manage stress and any emotional and social challenges. Questions to ask a health care provider  Do I need to meet with a diabetes  educator?  Do I need to meet with a dietitian?  What number can I call if I have questions?  When are the best times to check my blood glucose? Where to find more information:  American Diabetes Association: diabetes.org  Academy of Nutrition and Dietetics: www.eatright.[employer] of Diabetes and Digestive and Kidney Diseases (NIH): CarFlippers.tn Summary  A healthy meal plan will help you control your blood glucose and maintain a healthy lifestyle.  Working with a diet and nutrition specialist (dietitian) can help you make a meal plan that is best for you.  Keep in mind that carbohydrates (carbs) and alcohol have immediate effects on your blood glucose levels. It is important to count carbs and to use alcohol carefully. This information is not intended to replace advice given to you  by your health care provider. Make sure you discuss any questions you have with your health care provider. Document Revised: 10/16/2017 Document Reviewed: 12/08/2016 Elsevier Patient Education  2020 Elsevier Inc.      Edwina Barth, MD [facility]

## 2020-02-01 ENCOUNTER — Other Ambulatory Visit: Payer: Self-pay | Admitting: Emergency Medicine

## 2020-02-01 ENCOUNTER — Encounter: Payer: Self-pay | Admitting: Emergency Medicine

## 2020-02-01 DIAGNOSIS — N1831 Chronic kidney disease, stage 3a: Secondary | ICD-10-CM

## 2020-02-01 LAB — CBC WITH DIFFERENTIAL/PLATELET
Basophils Absolute: 0 10*3/uL (ref 0.0–0.2)
Basos: 1 %
EOS (ABSOLUTE): 0.2 10*3/uL (ref 0.0–0.4)
Eos: 3 %
Hematocrit: 38 % (ref 37.5–51.0)
Hemoglobin: 12.2 g/dL — ABNORMAL LOW (ref 13.0–17.7)
Immature Grans (Abs): 0 10*3/uL (ref 0.0–0.1)
Immature Granulocytes: 0 %
Lymphocytes Absolute: 1.9 10*3/uL (ref 0.7–3.1)
Lymphs: 27 %
MCH: 27.7 pg (ref 26.6–33.0)
MCHC: 32.1 g/dL (ref 31.5–35.7)
MCV: 86 fL (ref 79–97)
Monocytes Absolute: 0.6 10*3/uL (ref 0.1–0.9)
Monocytes: 8 %
Neutrophils Absolute: 4.5 10*3/uL (ref 1.4–7.0)
Neutrophils: 61 %
Platelets: 290 10*3/uL (ref 150–450)
RBC: 4.41 x10E6/uL (ref 4.14–5.80)
RDW: 15.5 % — ABNORMAL HIGH (ref 11.6–15.4)
WBC: 7.3 10*3/uL (ref 3.4–10.8)

## 2020-02-01 LAB — COMPREHENSIVE METABOLIC PANEL
ALT: 24 IU/L (ref 0–44)
AST: 25 IU/L (ref 0–40)
Albumin/Globulin Ratio: 1.6 (ref 1.2–2.2)
Albumin: 4.4 g/dL (ref 3.7–4.7)
Alkaline Phosphatase: 81 IU/L (ref 39–117)
BUN/Creatinine Ratio: 20 (ref 10–24)
BUN: 28 mg/dL — ABNORMAL HIGH (ref 8–27)
Bilirubin Total: 0.2 mg/dL (ref 0.0–1.2)
CO2: 21 mmol/L (ref 20–29)
Calcium: 10 mg/dL (ref 8.6–10.2)
Chloride: 106 mmol/L (ref 96–106)
Creatinine, Ser: 1.43 mg/dL — ABNORMAL HIGH (ref 0.76–1.27)
GFR calc Af Amer: 55 mL/min/{1.73_m2} — ABNORMAL LOW (ref >59)
GFR calc non Af Amer: 48 mL/min/{1.73_m2} — ABNORMAL LOW (ref >59)
Globulin, Total: 2.8 g/dL (ref 1.5–4.5)
Glucose: 86 mg/dL (ref 65–99)
Potassium: 4.5 mmol/L (ref 3.5–5.2)
Sodium: 141 mmol/L (ref 134–144)
Total Protein: 7.2 g/dL (ref 6.0–8.5)

## 2020-02-01 LAB — LIPID PANEL
Chol/HDL Ratio: 2.2 ratio (ref 0.0–5.0)
Cholesterol, Total: 159 mg/dL (ref 100–199)
HDL: 73 mg/dL (ref >39)
LDL Chol Calc (NIH): 71 mg/dL (ref 0–99)
Triglycerides: 78 mg/dL (ref 0–149)
VLDL Cholesterol Cal: 15 mg/dL (ref 5–40)

## 2020-02-01 LAB — MICROALBUMIN, URINE: Microalbumin, Urine: 63.5 ug/mL

## 2020-02-21 ENCOUNTER — Other Ambulatory Visit: Payer: Self-pay

## 2020-02-21 ENCOUNTER — Encounter (INDEPENDENT_AMBULATORY_CARE_PROVIDER_SITE_OTHER): Payer: Self-pay | Admitting: Ophthalmology

## 2020-02-21 ENCOUNTER — Ambulatory Visit (INDEPENDENT_AMBULATORY_CARE_PROVIDER_SITE_OTHER): Payer: Medicare Other | Admitting: Ophthalmology

## 2020-02-21 DIAGNOSIS — E113411 Type 2 diabetes mellitus with severe nonproliferative diabetic retinopathy with macular edema, right eye: Secondary | ICD-10-CM | POA: Diagnosis not present

## 2020-02-21 DIAGNOSIS — H359 Unspecified retinal disorder: Secondary | ICD-10-CM | POA: Insufficient documentation

## 2020-02-21 DIAGNOSIS — H5015 Alternating exotropia: Secondary | ICD-10-CM

## 2020-02-21 DIAGNOSIS — H2513 Age-related nuclear cataract, bilateral: Secondary | ICD-10-CM

## 2020-02-21 DIAGNOSIS — E113412 Type 2 diabetes mellitus with severe nonproliferative diabetic retinopathy with macular edema, left eye: Secondary | ICD-10-CM

## 2020-02-21 DIAGNOSIS — H3562 Retinal hemorrhage, left eye: Secondary | ICD-10-CM

## 2020-02-21 MED ORDER — BEVACIZUMAB CHEMO INJECTION 1.25MG/0.05ML SYRINGE FOR KALEIDOSCOPE
1.2500 mg | INTRAVITREAL | Status: AC | PRN
  Administered 2020-02-21: 1.25 mg via INTRAVITREAL

## 2020-03-22 ENCOUNTER — Other Ambulatory Visit: Payer: Self-pay | Admitting: Emergency Medicine

## 2020-03-22 MED ORDER — COLCHICINE 0.6 MG PO TABS: ORAL_TABLET | ORAL | 11 refills | Status: DC

## 2020-03-27 ENCOUNTER — Ambulatory Visit (INDEPENDENT_AMBULATORY_CARE_PROVIDER_SITE_OTHER): Payer: Medicare Other | Admitting: Ophthalmology

## 2020-03-27 ENCOUNTER — Other Ambulatory Visit: Payer: Self-pay

## 2020-03-27 DIAGNOSIS — E113412 Type 2 diabetes mellitus with severe nonproliferative diabetic retinopathy with macular edema, left eye: Secondary | ICD-10-CM

## 2020-03-27 MED ORDER — BEVACIZUMAB CHEMO INJECTION 1.25MG/0.05ML SYRINGE FOR KALEIDOSCOPE
1.2500 mg | INTRAVITREAL | Status: AC | PRN
  Administered 2020-03-27: 1.25 mg via INTRAVITREAL

## 2020-03-27 NOTE — Progress Notes (Signed)
03/27/2020     CHIEF COMPLAINT Patient presents for Retina Follow Up   HISTORY OF PRESENT ILLNESS: Steven Knapp is a 74 y.o. male who presents to the clinic today for:   HPI    Retina Follow Up    Patient presents with  Diabetic Retinopathy.  In left eye.  Duration of 5 weeks.  Since onset it is stable.          Comments    5 week follow up - OCT OU, Possible Avastin OS Patient denies change in vision and overall has no complaints except for seeing floaters. LBS  A1C       Last edited by Berenice Bouton on 03/27/2020  2:08 PM. (History)      Referring physician: Georgina Quint, MD [address] [place],  [place] [address]  HISTORICAL INFORMATION:   Selected notes from the MEDICAL RECORD NUMBER    Lab Results  Component Value Date   HGBA1C 7.0 (A) 01/31/2020     CURRENT MEDICATIONS: Current Outpatient Medications (Ophthalmic Drugs)  Medication Sig  . BEPREVE 1.5 % SOLN 1 drop 2 (two) times daily.  . SYSTANE ULTRA 0.4-0.3 % SOLN SMARTSIG:1 Drop(s) In Eye(s) As Needed  . timolol (TIMOPTIC) 0.5 % ophthalmic solution    No current facility-administered medications for this visit. (Ophthalmic Drugs)   Current Outpatient Medications (Other)  Medication Sig  . clobetasol cream (TEMOVATE) 0.05 % Apply 1 application topically 2 (two) times daily.  . colchicine 0.6 MG tablet Take 1.2 mg now and 0.6 mg one hour later. Take 0.6 mg daily after that x 5 days.  . Continuous Blood Gluc Sensor (FREESTYLE LIBRE SENSOR SYSTEM) MISC 1 Device by Does not apply route daily.  Marland Kitchen glipiZIDE (GLUCOTROL) 5 MG tablet Take 1 tablet (5 mg total) by mouth daily with breakfast.  . hydrochlorothiazide (HYDRODIURIL) 25 MG tablet hydrochlorothiazide 25 mg tablet  . HYDROcodone-acetaminophen (NORCO/VICODIN) 5-325 MG tablet Take 1 tablet by mouth every 6 (six) hours as needed for severe pain.  Marland Kitchen ibuprofen (ADVIL) 200 MG tablet Take 200 mg by mouth every 6 (six) hours as needed for moderate pain.    . indomethacin (INDOCIN) 50 MG capsule Take 1 capsule (50 mg total) by mouth 2 (two) times daily with a meal.  . Lactobacillus (PROBIOTIC ACIDOPHILUS PO) Take 1 capsule by mouth daily.  . predniSONE (STERAPRED UNI-PAK 21 TAB) 10 MG (21) TBPK tablet Take 6 tabs by mouth daily  for 2 days, then 5 tabs for 2 days, then 4 tabs for 2 days, then 3 tabs for 2 days, 2 tabs for 2 days, then 1 tab by mouth daily for 2 days (Patient not taking: Reported on 01/31/2020)   No current facility-administered medications for this visit. (Other)      REVIEW OF SYSTEMS:    ALLERGIES Allergies  Allergen Reactions  . Other Hives and Itching    Peanuts, strawberries, pickles, wine, and corn    PAST MEDICAL HISTORY Past Medical History:  Diagnosis Date  . Arthritis   . Cataract   . Diabetes mellitus without complication (HCC)    No past surgical history on file.  FAMILY HISTORY Family History  Problem Relation Age of Onset  . Diabetes Mother   . Hypertension Mother     SOCIAL HISTORY Social History   Tobacco Use  . Smoking status: Former Smoker    Years: 4.00    Types: Cigarettes  . Smokeless tobacco: Never Used  Substance Use Topics  .  Alcohol use: Yes    Alcohol/week: 3.0 standard drinks    Types: 3 Standard drinks or equivalent per week  . Drug use: Never         OPHTHALMIC EXAM: Base Eye Exam    Visual Acuity (Snellen - Linear)      Right Left   Dist [place] 20/20-2 20/30-2   Dist ph [place]  NI       Tonometry (Tonopen, 2:14 PM)      Right Left   Pressure 9 10       Pupils      Pupils Dark Light Shape React APD   Right PERRL 3 2 Round Minimal None   Left PERRL 3 2 Round Minimal None       Visual Fields (Counting fingers)      Left Right    Full Full       Extraocular Movement      Right Left    Full Full       Neuro/Psych    Oriented x3: Yes   Mood/Affect: Normal       Dilation    Left eye: 1.0% Mydriacyl, 2.5% Phenylephrine @ 2:15 PM        Slit Lamp  and Fundus Exam    External Exam      Right Left   External Normal Normal       Slit Lamp Exam      Right Left   Lids/Lashes Normal Normal   Conjunctiva/Sclera White and quiet White and quiet   Cornea Clear Clear   Anterior Chamber Deep and quiet Deep and quiet   Iris Round and reactive Round and reactive   Vitreous Normal Normal          IMAGING AND PROCEDURES  Imaging and Procedures for 03/27/20  OCT, Retina - OU - Both Eyes       Right Eye Quality was good. Scan locations included subfoveal.   Left Eye Quality was good. Scan locations included subfoveal.                 ASSESSMENT/PLAN:  No problem-specific Assessment & Plan notes found for this encounter.    No diagnosis found.  1.  OS, clinically significant macular edema overall improved yet worse since last visit after Avastin 5 weeks ago.  We will repeat injection intravitreal OS today and consider change to Eylea agent OS next  2.  OD with focal CSME superiorly, will observe as not involving the FAZ  3.  Ophthalmic Meds Ordered this visit:  No orders of the defined types were placed in this encounter.      No follow-ups on file.  There are no Patient Instructions on file for this visit.   Explained the diagnoses, plan, and follow up with the patient and they expressed understanding.  Patient expressed understanding of the importance of proper follow up care.   Clent Demark Marlo Arriola M.D. Diseases & Surgery of the Retina and Vitreous [facility] [place] 03/27/20     Abbreviations: M myopia (nearsighted); A astigmatism; H hyperopia (farsighted); P presbyopia; Mrx spectacle prescription;  CTL contact lenses; OD right eye; OS left eye; OU both eyes  XT exotropia; ET esotropia; PEK punctate epithelial keratitis; PEE punctate epithelial erosions; DES dry eye syndrome; MGD meibomian gland dysfunction; ATs artificial tears; PFAT's preservative free artificial tears; [place] nuclear sclerotic  cataract; PSC posterior subcapsular cataract; ERM epi-retinal membrane; PVD posterior vitreous detachment; RD retinal detachment; DM diabetes mellitus; DR  diabetic retinopathy; NPDR non-proliferative diabetic retinopathy; PDR proliferative diabetic retinopathy; CSME clinically significant macular edema; DME diabetic macular edema; dbh dot blot hemorrhages; CWS cotton wool spot; POAG primary open angle glaucoma; C/D cup-to-disc ratio; HVF humphrey visual field; GVF goldmann visual field; OCT optical coherence tomography; IOP intraocular pressure; BRVO Branch retinal vein occlusion; CRVO central retinal vein occlusion; CRAO central retinal artery occlusion; BRAO branch retinal artery occlusion; RT retinal tear; SB scleral buckle; PPV pars plana vitrectomy; VH Vitreous hemorrhage; PRP panretinal laser photocoagulation; IVK intravitreal kenalog; VMT vitreomacular traction; MH Macular hole;  NVD neovascularization of the disc; NVE neovascularization elsewhere; AREDS age related eye disease study; ARMD age related macular degeneration; POAG primary open angle glaucoma; EBMD epithelial/anterior basement membrane dystrophy; ACIOL anterior chamber intraocular lens; IOL intraocular lens; PCIOL posterior chamber intraocular lens; Phaco/IOL phacoemulsification with intraocular lens placement; [place] photorefractive keratectomy; LASIK laser assisted in situ keratomileusis; HTN hypertension; DM diabetes mellitus; COPD chronic obstructive pulmonary disease

## 2020-03-27 NOTE — Assessment & Plan Note (Signed)
The nature of diabetic macular edema was discussed with the patient. Treatment options were outlined including medical therapy, laser & vitrectomy. The use of injectable medications reviewed, including Avastin, Lucentis, and Eylea. Periodic injections into the eye are likely to resolve diabetic macular edema (swelling in the center of vision). Initially, injections are delivered are delivered every 4-6 weeks, and the interval extended as the condition improves. On average, 8-9 injections the first year, and 5 in year 2. Improvement in the condition most often improves on medical therapy. Occasional use of focal laser is also recommended for residual macular edema (swelling). Excellent control of blood glucose and blood pressure are encouraged under the care of a primary physician or endocrinologist. Similarly, attempts to maintain serum cholesterol, low density lipoproteins, and high-density lipoproteins in a favorable range were recommended.   OS, stable yet somewhat worse now after Avastin No. 2.  Will repeat today and consider change to Eylea injection next visit 5 to 6 weeks

## 2020-03-29 ENCOUNTER — Ambulatory Visit: Payer: Medicare Other | Admitting: Emergency Medicine

## 2020-04-12 ENCOUNTER — Telehealth: Payer: Self-pay | Admitting: Emergency Medicine

## 2020-04-12 ENCOUNTER — Other Ambulatory Visit: Payer: Self-pay | Admitting: Emergency Medicine

## 2020-04-12 MED ORDER — SILDENAFIL CITRATE 100 MG PO TABS: 50.0000 mg | ORAL_TABLET | Freq: Every day | ORAL | 11 refills | Status: DC | PRN

## 2020-04-12 NOTE — Telephone Encounter (Signed)
Looking back on last office visit on 01/31/20 I do not see where you and the patient discussed anything about male enhancement medication. Do I need to get this patient scheduled for another office visit to discuss this matter?

## 2020-04-12 NOTE — Telephone Encounter (Signed)
By male enhancement you mean erectile dysfunction?

## 2020-04-12 NOTE — Telephone Encounter (Signed)
Yes

## 2020-04-12 NOTE — Telephone Encounter (Signed)
Prescription for Viagra sent.  Thanks.

## 2020-04-12 NOTE — Telephone Encounter (Signed)
Patient is calling because on his last visit he had asked for male enhancement medication. He was hoping Dr.Sagardia would call this in for him    Please advise [phone]

## 2020-04-17 NOTE — Telephone Encounter (Signed)
Patient was informed rx has been sent to the pharmacy 

## 2020-05-01 ENCOUNTER — Encounter (INDEPENDENT_AMBULATORY_CARE_PROVIDER_SITE_OTHER): Payer: Medicare Other | Admitting: Ophthalmology

## 2020-05-28 DIAGNOSIS — N1831 Chronic kidney disease, stage 3a: Secondary | ICD-10-CM | POA: Diagnosis not present

## 2020-05-28 DIAGNOSIS — M109 Gout, unspecified: Secondary | ICD-10-CM | POA: Diagnosis not present

## 2020-05-28 DIAGNOSIS — D631 Anemia in chronic kidney disease: Secondary | ICD-10-CM | POA: Diagnosis not present

## 2020-05-28 DIAGNOSIS — N189 Chronic kidney disease, unspecified: Secondary | ICD-10-CM | POA: Diagnosis not present

## 2020-05-29 ENCOUNTER — Other Ambulatory Visit: Payer: Self-pay | Admitting: Nephrology

## 2020-05-29 DIAGNOSIS — N1831 Chronic kidney disease, stage 3a: Secondary | ICD-10-CM

## 2020-05-31 ENCOUNTER — Encounter (INDEPENDENT_AMBULATORY_CARE_PROVIDER_SITE_OTHER): Payer: Self-pay

## 2020-05-31 ENCOUNTER — Encounter (INDEPENDENT_AMBULATORY_CARE_PROVIDER_SITE_OTHER): Payer: Medicare Other | Admitting: Ophthalmology

## 2020-06-07 ENCOUNTER — Ambulatory Visit
Admission: RE | Admit: 2020-06-07 | Discharge: 2020-06-07 | Disposition: A | Discharge status: Home / Self Care | Payer: Medicare Other | Source: Ambulatory Visit | Attending: Nephrology | Admitting: Nephrology

## 2020-06-07 DIAGNOSIS — N281 Cyst of kidney, acquired: Secondary | ICD-10-CM | POA: Diagnosis not present

## 2020-06-07 DIAGNOSIS — N1831 Chronic kidney disease, stage 3a: Secondary | ICD-10-CM

## 2020-06-07 DIAGNOSIS — N183 Chronic kidney disease, stage 3 unspecified: Secondary | ICD-10-CM | POA: Diagnosis not present

## 2020-07-02 DIAGNOSIS — N1831 Chronic kidney disease, stage 3a: Secondary | ICD-10-CM | POA: Diagnosis not present

## 2020-07-03 ENCOUNTER — Ambulatory Visit (INDEPENDENT_AMBULATORY_CARE_PROVIDER_SITE_OTHER): Payer: Medicare Other | Admitting: Ophthalmology

## 2020-07-03 ENCOUNTER — Encounter (INDEPENDENT_AMBULATORY_CARE_PROVIDER_SITE_OTHER): Payer: Self-pay | Admitting: Ophthalmology

## 2020-07-03 ENCOUNTER — Other Ambulatory Visit: Payer: Self-pay

## 2020-07-03 DIAGNOSIS — E113412 Type 2 diabetes mellitus with severe nonproliferative diabetic retinopathy with macular edema, left eye: Secondary | ICD-10-CM

## 2020-07-03 MED ORDER — AFLIBERCEPT 2MG/0.05ML IZ SOLN FOR KALEIDOSCOPE
2.0000 mg | INTRAVITREAL | Status: AC | PRN
  Administered 2020-07-03: 2 mg via INTRAVITREAL

## 2020-07-03 NOTE — Assessment & Plan Note (Signed)
The nature of diabetic macular edema was discussed with the patient. Treatment options were outlined including medical therapy, laser & vitrectomy. The use of injectable medications reviewed, including Avastin, Lucentis, and Eylea. Periodic injections into the eye are likely to resolve diabetic macular edema (swelling in the center of vision). Initially, injections are delivered are delivered every 4-6 weeks, and the interval extended as the condition improves. On average, 8-9 injections the first year, and 5 in year 2. Improvement in the condition most often improves on medical therapy. Occasional use of focal laser is also recommended for residual macular edema (swelling). Excellent control of blood glucose and blood pressure are encouraged under the care of a primary physician or endocrinologist. Similarly, attempts to maintain serum cholesterol, low density lipoproteins, and high-density lipoproteins in a favorable range were recommended.   OS, at delayed follow-up interval of 14 weeks after planned 5 weeks, still CSME improved temporally.  Post Eylea injection.  We will repeat injection OS today and examination in 8 weeks

## 2020-07-03 NOTE — Progress Notes (Signed)
07/03/2020     CHIEF COMPLAINT Patient presents for Retina Follow Up   HISTORY OF PRESENT ILLNESS: Steven Knapp is a 74 y.o. male who presents to the clinic today for:   HPI    Retina Follow Up    Patient presents with  Diabetic Retinopathy.  In left eye.  Severity is severe.  Duration of 14 weeks.  Since onset it is stable.  I, the attending physician,  performed the HPI with the patient and updated documentation appropriately.          Comments    14 Week NPDR f\u OS. Possible Eylea OS. OCT  Pt states no changes in vision. BGL: did not check       Last edited by Steven Knapp on 07/03/2020  9:14 AM. (History)      Referring physician: Georgina Quint, MD [address] [place],  [place] [address]  HISTORICAL INFORMATION:   Selected notes from the MEDICAL RECORD NUMBER    Lab Results  Component Value Date   HGBA1C 7.0 (A) 01/31/2020     CURRENT MEDICATIONS: Current Outpatient Medications (Ophthalmic Drugs)  Medication Sig  . BEPREVE 1.5 % SOLN 1 drop 2 (two) times daily.  . SYSTANE ULTRA 0.4-0.3 % SOLN SMARTSIG:1 Drop(s) In Eye(s) As Needed  . timolol (TIMOPTIC) 0.5 % ophthalmic solution    No current facility-administered medications for this visit. (Ophthalmic Drugs)   Current Outpatient Medications (Other)  Medication Sig  . clobetasol cream (TEMOVATE) 0.05 % Apply 1 application topically 2 (two) times daily.  . colchicine 0.6 MG tablet Take 1.2 mg now and 0.6 mg one hour later. Take 0.6 mg daily after that x 5 days.  . Continuous Blood Gluc Sensor (FREESTYLE LIBRE SENSOR SYSTEM) MISC 1 Device by Does not apply route daily.  Marland Kitchen glipiZIDE (GLUCOTROL) 5 MG tablet Take 1 tablet (5 mg total) by mouth daily with breakfast.  . hydrochlorothiazide (HYDRODIURIL) 25 MG tablet hydrochlorothiazide 25 mg tablet  . HYDROcodone-acetaminophen (NORCO/VICODIN) 5-325 MG tablet Take 1 tablet by mouth every 6 (six) hours as needed for severe pain.  Marland Kitchen ibuprofen (ADVIL) 200  MG tablet Take 200 mg by mouth every 6 (six) hours as needed for moderate pain.  . indomethacin (INDOCIN) 50 MG capsule Take 1 capsule (50 mg total) by mouth 2 (two) times daily with a meal.  . Lactobacillus (PROBIOTIC ACIDOPHILUS PO) Take 1 capsule by mouth daily.  . predniSONE (STERAPRED UNI-PAK 21 TAB) 10 MG (21) TBPK tablet Take 6 tabs by mouth daily  for 2 days, then 5 tabs for 2 days, then 4 tabs for 2 days, then 3 tabs for 2 days, 2 tabs for 2 days, then 1 tab by mouth daily for 2 days (Patient not taking: Reported on 01/31/2020)  . sildenafil (VIAGRA) 100 MG tablet Take 0.5-1 tablets (50-100 mg total) by mouth daily as needed for erectile dysfunction.   No current facility-administered medications for this visit. (Other)      REVIEW OF SYSTEMS:    ALLERGIES Allergies  Allergen Reactions  . Other Hives and Itching    Peanuts, strawberries, pickles, wine, and corn    PAST MEDICAL HISTORY Past Medical History:  Diagnosis Date  . Arthritis   . Cataract   . Diabetes mellitus without complication (HCC)    History reviewed. No pertinent surgical history.  FAMILY HISTORY Family History  Problem Relation Age of Onset  . Diabetes Mother   . Hypertension Mother     SOCIAL  HISTORY Social History   Tobacco Use  . Smoking status: Former Smoker    Years: 4.00    Types: Cigarettes  . Smokeless tobacco: Never Used  Substance Use Topics  . Alcohol use: Yes    Alcohol/week: 3.0 standard drinks    Types: 3 Standard drinks or equivalent per week  . Drug use: Never         OPHTHALMIC EXAM:  Base Eye Exam    Visual Acuity (Snellen - Linear)      Right Left   Dist Steven Knapp 20/25 20/50 -2   Dist ph Steven Knapp  20/40 -1       Tonometry (Tonopen, 9:19 AM)      Right Left   Pressure 10 12       Pupils      Pupils Dark Light Shape React APD   Right PERRL 3 2 Round Minimal None   Left PERRL 3 2 Round Minimal None       Visual Fields (Counting fingers)      Left Right    Full  Full       Neuro/Psych    Oriented x3: Yes   Mood/Affect: Normal       Dilation    Left eye: 1.0% Mydriacyl, 2.5% Phenylephrine @ 9:19 AM        Slit Lamp and Fundus Exam    External Exam      Right Left   External Normal Normal       Slit Lamp Exam      Right Left   Lids/Lashes Normal Normal   Conjunctiva/Sclera White and quiet White and quiet   Cornea Clear Clear   Anterior Chamber Deep and quiet Deep and quiet   Iris Round and reactive Round and reactive   Lens 2+ Nuclear sclerosis 2+ Nuclear sclerosis   Anterior Vitreous Normal Normal       Fundus Exam      Right Left   Posterior Vitreous  Central vitreous floaters   Disc  Normal   C/D Ratio  0.5   Macula  Mild clinically significant macular edema, Exudates   Vessels  NPDR severe   Periphery  Normal          IMAGING AND PROCEDURES  Imaging and Procedures for 07/03/20  OCT, Retina - OU - Both Eyes       Right Eye Quality was good. Scan locations included subfoveal. Central Foveal Thickness: 287. Progression has been stable.   Left Eye Quality was borderline. Scan locations included subfoveal. Central Foveal Thickness: 305. Progression has improved.   Notes Minor retinal thickening superior to the fovea OD, OS with overall improved CSME at the end temporal to the fovea yet at 14 weeks post injection Eylea.  We will repeat today and examination in 8 weeks       Intravitreal Injection, Pharmacologic Agent - OS - Left Eye       Time Out 07/03/2020. 9:58 AM. Confirmed correct patient, procedure, site, and patient consented.   Anesthesia Topical anesthesia was used. Anesthetic medications included Steven Knapp 3.5%.   Procedure Preparation included Ofloxacin , Tobramycin 0.3%, 10% betadine to eyelids, 5% betadine to ocular surface. A 30 gauge needle was used.   Injection:  2 mg aflibercept Steven Knapp) SOLN   NDC: [id number], Lot: [id number]   Route: Intravitreal, Site: Left Eye, Waste: 0  mg  Post-op Post injection exam found visual acuity of at least counting fingers. The patient tolerated the procedure well. There were no  complications. The patient received written and verbal post procedure care education. Post injection medications were not given.                 ASSESSMENT/PLAN:  Severe nonproliferative diabetic retinopathy of left eye, with macular edema, associated with type 2 diabetes mellitus (HCC)  The nature of diabetic macular edema was discussed with the patient. Treatment options were outlined including medical therapy, laser & vitrectomy. The use of injectable medications reviewed, including Avastin, Lucentis, and Eylea. Periodic injections into the eye are likely to resolve diabetic macular edema (swelling in the center of vision). Initially, injections are delivered are delivered every 4-6 weeks, and the interval extended as the condition improves. On average, 8-9 injections the first year, and 5 in year 2. Improvement in the condition most often improves on medical therapy. Occasional use of focal laser is also recommended for residual macular edema (swelling). Excellent control of blood glucose and blood pressure are encouraged under the care of a primary physician or endocrinologist. Similarly, attempts to maintain serum cholesterol, low density lipoproteins, and high-density lipoproteins in a favorable range were recommended.   OS, at delayed follow-up interval of 14 weeks after planned 5 weeks, still CSME improved temporally.  Post Eylea injection.  We will repeat injection OS today and examination in 8 weeks      ICD-10-CM   1. Severe nonproliferative diabetic retinopathy of left eye, with macular edema, associated with type 2 diabetes mellitus (HCC)  E11.3412 OCT, Retina - OU - Both Eyes    Intravitreal Injection, Pharmacologic Agent - OS - Left Eye    aflibercept (EYLEA) SOLN 2 mg    1.  Repeat injection intravitreal Eylea today for diabetic CSME OS,  and examination in 8 -9 weeks  2.  For examination  3.  Ophthalmic Meds Ordered this visit:  Meds ordered this encounter  Medications  . aflibercept (EYLEA) SOLN 2 mg       Return in about 8 weeks (around 08/28/2020) for DILATE OU, EYLEA OCT, OS.  Patient Instructions  To notify the office of any new onset visual acuity declines or changes Diabetes Mellitus and Exercise Exercising regularly is important for your overall health, especially when you have diabetes (diabetes mellitus). Exercising is not only about losing weight. It has many other health benefits, such as increasing muscle strength and bone density and reducing body fat and stress. This leads to improved fitness, flexibility, and endurance, all of which result in better overall health. Exercise has additional benefits for people with diabetes, including:  Reducing appetite.  Helping to lower and control blood glucose.  Lowering blood pressure.  Helping to control amounts of fatty substances (lipids) in the blood, such as cholesterol and triglycerides.  Helping the body to respond better to insulin (improving insulin sensitivity).  Reducing how much insulin the body needs.  Decreasing the risk for heart disease by: ? Lowering cholesterol and triglyceride levels. ? Increasing the levels of good cholesterol. ? Lowering blood glucose levels. What is my activity plan? Your health care provider or certified diabetes educator can help you make a plan for the type and frequency of exercise (activity plan) that works for you. Make sure that you:  Do at least 150 minutes of moderate-intensity or vigorous-intensity exercise each week. This could be brisk walking, biking, or water aerobics. ? Do stretching and strength exercises, such as yoga or weightlifting, at least 2 times a week. ? Spread out your activity over at least 3 days of  the week.  Get some form of physical activity every day. ? Do not go more than 2 days  in a row without some kind of physical activity. ? Avoid being inactive for more than 30 minutes at a time. Take frequent breaks to walk or stretch.  Choose a type of exercise or activity that you enjoy, and set realistic goals.  Start slowly, and gradually increase the intensity of your exercise over time. What do I need to know about managing my diabetes?   Check your blood glucose before and after exercising. ? If your blood glucose is 240 mg/dL (00.4 mmol/L) or higher before you exercise, check your urine for ketones. If you have ketones in your urine, do not exercise until your blood glucose returns to normal. ? If your blood glucose is 100 mg/dL (5.6 mmol/L) or lower, eat a snack containing 15-20 grams of carbohydrate. Check your blood glucose 15 minutes after the snack to make sure that your level is above 100 mg/dL (5.6 mmol/L) before you start your exercise.  Know the symptoms of low blood glucose (hypoglycemia) and how to treat it. Your risk for hypoglycemia increases during and after exercise. Common symptoms of hypoglycemia can include: ? Hunger. ? Anxiety. ? Sweating and feeling clammy. ? Confusion. ? Dizziness or feeling light-headed. ? Increased heart rate or palpitations. ? Blurry vision. ? Tingling or numbness around the mouth, lips, or tongue. ? Tremors or shakes. ? Irritability.  Keep a rapid-acting carbohydrate snack available before, during, and after exercise to help prevent or treat hypoglycemia.  Avoid injecting insulin into areas of the body that are going to be exercised. For example, avoid injecting insulin into: ? The arms, when playing tennis. ? The legs, when jogging.  Keep records of your exercise habits. Doing this can help you and your health care provider adjust your diabetes management plan as needed. Write down: ? Food that you eat before and after you exercise. ? Blood glucose levels before and after you exercise. ? The type and amount of  exercise you have done. ? When your insulin is expected to peak, if you use insulin. Avoid exercising at times when your insulin is peaking.  When you start a new exercise or activity, work with your health care provider to make sure the activity is safe for you, and to adjust your insulin, medicines, or food intake as needed.  Drink plenty of water while you exercise to prevent dehydration or heat stroke. Drink enough fluid to keep your urine clear or pale yellow. Summary  Exercising regularly is important for your overall health, especially when you have diabetes (diabetes mellitus).  Exercising has many health benefits, such as increasing muscle strength and bone density and reducing body fat and stress.  Your health care provider or certified diabetes educator can help you make a plan for the type and frequency of exercise (activity plan) that works for you.  When you start a new exercise or activity, work with your health care provider to make sure the activity is safe for you, and to adjust your insulin, medicines, or food intake as needed. This information is not intended to replace advice given to you by your health care provider. Make sure you discuss any questions you have with your health care provider. Document Revised: 05/28/2017 Document Reviewed: 04/14/2016 Elsevier Patient Education  2020 [employer].     Explained the diagnoses, plan, and follow up with the patient and they expressed understanding.  Patient expressed understanding  of the importance of proper follow up care.   Alford Highland Phillip Maffei M.D. Diseases & Surgery of the Retina and Vitreous [facility] 07/03/20     Abbreviations: M myopia (nearsighted); A astigmatism; H hyperopia (farsighted); P presbyopia; Mrx spectacle prescription;  CTL contact lenses; OD right eye; OS left eye; OU both eyes  XT exotropia; ET esotropia; PEK punctate epithelial keratitis; PEE punctate epithelial erosions; DES dry  eye syndrome; MGD meibomian gland dysfunction; ATs artificial tears; PFAT's preservative free artificial tears; NSC nuclear sclerotic cataract; PSC posterior subcapsular cataract; ERM epi-retinal membrane; PVD posterior vitreous detachment; RD retinal detachment; DM diabetes mellitus; DR diabetic retinopathy; NPDR non-proliferative diabetic retinopathy; PDR proliferative diabetic retinopathy; CSME clinically significant macular edema; DME diabetic macular edema; dbh dot blot hemorrhages; CWS cotton wool spot; POAG primary open angle glaucoma; C/D cup-to-disc ratio; HVF humphrey visual field; GVF goldmann visual field; OCT optical coherence tomography; IOP intraocular pressure; BRVO Branch retinal vein occlusion; CRVO central retinal vein occlusion; CRAO central retinal artery occlusion; BRAO branch retinal artery occlusion; RT retinal tear; SB scleral buckle; PPV pars plana vitrectomy; VH Vitreous hemorrhage; PRP panretinal laser photocoagulation; IVK intravitreal kenalog; VMT vitreomacular traction; MH Macular hole;  NVD neovascularization of the disc; NVE neovascularization elsewhere; AREDS age related eye disease study; ARMD age related macular degeneration; POAG primary open angle glaucoma; EBMD epithelial/anterior basement membrane dystrophy; ACIOL anterior chamber intraocular lens; IOL intraocular lens; PCIOL posterior chamber intraocular lens; Phaco/IOL phacoemulsification with intraocular lens placement; PRK photorefractive keratectomy; LASIK laser assisted in situ keratomileusis; HTN hypertension; DM diabetes mellitus; COPD chronic obstructive pulmonary disease

## 2020-07-03 NOTE — Patient Instructions (Addendum)
To notify the office of any new onset visual acuity declines or changes Diabetes Mellitus and Exercise Exercising regularly is important for your overall health, especially when you have diabetes (diabetes mellitus). Exercising is not only about losing weight. It has many other health benefits, such as increasing muscle strength and bone density and reducing body fat and stress. This leads to improved fitness, flexibility, and endurance, all of which result in better overall health. Exercise has additional benefits for people with diabetes, including:  Reducing appetite.  Helping to lower and control blood glucose.  Lowering blood pressure.  Helping to control amounts of fatty substances (lipids) in the blood, such as cholesterol and triglycerides.  Helping the body to respond better to insulin (improving insulin sensitivity).  Reducing how much insulin the body needs.  Decreasing the risk for heart disease by: ? Lowering cholesterol and triglyceride levels. ? Increasing the levels of good cholesterol. ? Lowering blood glucose levels. What is my activity plan? Your health care provider or certified diabetes educator can help you make a plan for the type and frequency of exercise (activity plan) that works for you. Make sure that you:  Do at least 150 minutes of moderate-intensity or vigorous-intensity exercise each week. This could be brisk walking, biking, or water aerobics. ? Do stretching and strength exercises, such as yoga or weightlifting, at least 2 times a week. ? Spread out your activity over at least 3 days of the week.  Get some form of physical activity every day. ? Do not go more than 2 days in a row without some kind of physical activity. ? Avoid being inactive for more than 30 minutes at a time. Take frequent breaks to walk or stretch.  Choose a type of exercise or activity that you enjoy, and set realistic goals.  Start slowly, and gradually increase the intensity of  your exercise over time. What do I need to know about managing my diabetes?   Check your blood glucose before and after exercising. ? If your blood glucose is 240 mg/dL (46.9 mmol/L) or higher before you exercise, check your urine for ketones. If you have ketones in your urine, do not exercise until your blood glucose returns to normal. ? If your blood glucose is 100 mg/dL (5.6 mmol/L) or lower, eat a snack containing 15-20 grams of carbohydrate. Check your blood glucose 15 minutes after the snack to make sure that your level is above 100 mg/dL (5.6 mmol/L) before you start your exercise.  Know the symptoms of low blood glucose (hypoglycemia) and how to treat it. Your risk for hypoglycemia increases during and after exercise. Common symptoms of hypoglycemia can include: ? Hunger. ? Anxiety. ? Sweating and feeling clammy. ? Confusion. ? Dizziness or feeling light-headed. ? Increased heart rate or palpitations. ? Blurry vision. ? Tingling or numbness around the mouth, lips, or tongue. ? Tremors or shakes. ? Irritability.  Keep a rapid-acting carbohydrate snack available before, during, and after exercise to help prevent or treat hypoglycemia.  Avoid injecting insulin into areas of the body that are going to be exercised. For example, avoid injecting insulin into: ? The arms, when playing tennis. ? The legs, when jogging.  Keep records of your exercise habits. Doing this can help you and your health care provider adjust your diabetes management plan as needed. Write down: ? Food that you eat before and after you exercise. ? Blood glucose levels before and after you exercise. ? The type and amount of exercise you  have done. ? When your insulin is expected to peak, if you use insulin. Avoid exercising at times when your insulin is peaking.  When you start a new exercise or activity, work with your health care provider to make sure the activity is safe for you, and to adjust your insulin,  medicines, or food intake as needed.  Drink plenty of water while you exercise to prevent dehydration or heat stroke. Drink enough fluid to keep your urine clear or pale yellow. Summary  Exercising regularly is important for your overall health, especially when you have diabetes (diabetes mellitus).  Exercising has many health benefits, such as increasing muscle strength and bone density and reducing body fat and stress.  Your health care provider or certified diabetes educator can help you make a plan for the type and frequency of exercise (activity plan) that works for you.  When you start a new exercise or activity, work with your health care provider to make sure the activity is safe for you, and to adjust your insulin, medicines, or food intake as needed. This information is not intended to replace advice given to you by your health care provider. Make sure you discuss any questions you have with your health care provider. Document Revised: 05/28/2017 Document Reviewed: 04/14/2016 Elsevier Patient Education  2020 [employer].

## 2020-07-30 ENCOUNTER — Ambulatory Visit (INDEPENDENT_AMBULATORY_CARE_PROVIDER_SITE_OTHER): Payer: Medicare Other | Admitting: Emergency Medicine

## 2020-07-30 ENCOUNTER — Other Ambulatory Visit: Payer: Self-pay

## 2020-07-30 ENCOUNTER — Encounter: Payer: Self-pay | Admitting: Emergency Medicine

## 2020-07-30 VITALS — BP 102/59 | HR 63 | Temp 98.2°F | Resp 16 | Ht 74.0 in | Wt 194.0 lb

## 2020-07-30 DIAGNOSIS — E113412 Type 2 diabetes mellitus with severe nonproliferative diabetic retinopathy with macular edema, left eye: Secondary | ICD-10-CM

## 2020-07-30 DIAGNOSIS — Z23 Encounter for immunization: Secondary | ICD-10-CM

## 2020-07-30 DIAGNOSIS — M2550 Pain in unspecified joint: Secondary | ICD-10-CM

## 2020-07-30 DIAGNOSIS — Z8739 Personal history of other diseases of the musculoskeletal system and connective tissue: Secondary | ICD-10-CM

## 2020-07-30 DIAGNOSIS — E1165 Type 2 diabetes mellitus with hyperglycemia: Secondary | ICD-10-CM | POA: Diagnosis not present

## 2020-07-30 DIAGNOSIS — L298 Other pruritus: Secondary | ICD-10-CM

## 2020-07-30 DIAGNOSIS — E113411 Type 2 diabetes mellitus with severe nonproliferative diabetic retinopathy with macular edema, right eye: Secondary | ICD-10-CM

## 2020-07-30 LAB — POCT GLYCOSYLATED HEMOGLOBIN (HGB A1C): Hemoglobin A1C: 6.4 % — AB (ref 4.0–5.6)

## 2020-07-30 LAB — GLUCOSE, POCT (MANUAL RESULT ENTRY): POC Glucose: 98 mg/dl (ref 70–99)

## 2020-07-30 MED ORDER — ROSUVASTATIN CALCIUM 10 MG PO TABS: 10.0000 mg | ORAL_TABLET | Freq: Every day | ORAL | 3 refills | Status: DC

## 2020-07-30 NOTE — Assessment & Plan Note (Signed)
Well-controlled diabetes with hemoglobin A1c of 6.4.  Continue present medication no changes. We will start statin therapy with rosuvastatin 10 mg daily. Follow-up in 6 months.

## 2020-07-30 NOTE — Patient Instructions (Addendum)
   If you have lab work done today you will be contacted with your lab results within the next 2 weeks.  If you have not heard from us then please contact us. The fastest way to get your results is to register for My Chart.   IF you received an x-ray today, you will receive an invoice from [facility]. Please contact [facility] at [phone] with questions or concerns regarding your invoice.   IF you received labwork today, you will receive an invoice from [facility]. Please contact [facility] at [phone] with questions or concerns regarding your invoice.   Our billing staff will not be able to assist you with questions regarding bills from these companies.  You will be contacted with the lab results as soon as they are available. The fastest way to get your results is to activate your My Chart account. Instructions are located on the last page of this paperwork. If you have not heard from us regarding the results in 2 weeks, please contact this office.     Health Maintenance After Age 65 After age 65, you are at a higher risk for certain long-term diseases and infections as well as injuries from falls. Falls are a major cause of broken bones and head injuries in people who are older than age 65. Getting regular preventive care can help to keep you healthy and well. Preventive care includes getting regular testing and making lifestyle changes as recommended by your health care provider. Talk with your health care provider about:  Which screenings and tests you should have. A screening is a test that checks for a disease when you have no symptoms.  A diet and exercise plan that is right for you. What should I know about screenings and tests to prevent falls? Screening and testing are the best ways to find a health problem early. Early diagnosis and treatment give you the best chance of managing medical conditions that are common after age 65. Certain conditions and  lifestyle choices may make you more likely to have a fall. Your health care provider may recommend:  Regular vision checks. Poor vision and conditions such as cataracts can make you more likely to have a fall. If you wear glasses, make sure to get your prescription updated if your vision changes.  Medicine review. Work with your health care provider to regularly review all of the medicines you are taking, including over-the-counter medicines. Ask your health care provider about any side effects that may make you more likely to have a fall. Tell your health care provider if any medicines that you take make you feel dizzy or sleepy.  Osteoporosis screening. Osteoporosis is a condition that causes the bones to get weaker. This can make the bones weak and cause them to break more easily.  Blood pressure screening. Blood pressure changes and medicines to control blood pressure can make you feel dizzy.  Strength and balance checks. Your health care provider may recommend certain tests to check your strength and balance while standing, walking, or changing positions.  Foot health exam. Foot pain and numbness, as well as not wearing proper footwear, can make you more likely to have a fall.  Depression screening. You may be more likely to have a fall if you have a fear of falling, feel emotionally low, or feel unable to do activities that you used to do.  Alcohol use screening. Using too much alcohol can affect your balance and may make you more likely to   have a fall. What actions can I take to lower my risk of falls? General instructions  Talk with your health care provider about your risks for falling. Tell your health care provider if: ? You fall. Be sure to tell your health care provider about all falls, even ones that seem minor. ? You feel dizzy, sleepy, or off-balance.  Take over-the-counter and prescription medicines only as told by your health care provider. These include any  supplements.  Eat a healthy diet and maintain a healthy weight. A healthy diet includes low-fat dairy products, low-fat (lean) meats, and fiber from whole grains, beans, and lots of fruits and vegetables. Home safety  Remove any tripping hazards, such as rugs, cords, and clutter.  Install safety equipment such as grab bars in bathrooms and safety rails on stairs.  Keep rooms and walkways well-lit. Activity   Follow a regular exercise program to stay fit. This will help you maintain your balance. Ask your health care provider what types of exercise are appropriate for you.  If you need a cane or walker, use it as recommended by your health care provider.  Wear supportive shoes that have nonskid soles. Lifestyle  Do not drink alcohol if your health care provider tells you not to drink.  If you drink alcohol, limit how much you have: ? 0-1 drink a day for women. ? 0-2 drinks a day for men.  Be aware of how much alcohol is in your drink. In the [place] one drink equals one typical bottle of beer (12 oz), one-half glass of wine (5 oz), or one shot of hard liquor (1 oz).  Do not use any products that contain nicotine or tobacco, such as cigarettes and e-cigarettes. If you need help quitting, ask your health care provider. Summary  Having a healthy lifestyle and getting preventive care can help to protect your health and wellness after age 65.  Screening and testing are the best way to find a health problem early and help you avoid having a fall. Early diagnosis and treatment give you the best chance for managing medical conditions that are more common for people who are older than age 65.  Falls are a major cause of broken bones and head injuries in people who are older than age 65. Take precautions to prevent a fall at home.  Work with your health care provider to learn what changes you can make to improve your health and wellness and to prevent falls. This information is not intended  to replace advice given to you by your health care provider. Make sure you discuss any questions you have with your health care provider. Document Revised: 02/24/2019 Document Reviewed: 09/16/2017 Elsevier Patient Education  2020 Elsevier Inc.  

## 2020-07-30 NOTE — Progress Notes (Signed)
Steven Knapp 74 y.o.   Chief Complaint  Patient presents with  . Diabetes    follow up 6 month  . Medication Refill    PEND   Office visit 01/31/2020: ASSESSMENT & PLAN: Diabetes mellitus (HCC) Hemoglobin A1c better than last time at 7.0.  Patient only taking glipizide 5 mg in the morning.  Has improved his diet and nutrition.  Continue present regimen.  No changes. HISTORY OF PRESENT ILLNESS: This is a 74 y.o. male here for follow-up of chronic medical problems and medication refill. 1.  Diabetes with retinopathy.  Sees ophthalmologist on a regular basis and getting intraocular injections.  On glipizide 5 mg daily. Lab Results  Component Value Date   HGBA1C 7.0 (A) 01/31/2020   2.  Chronic kidney disease.  Normal renal ultrasound July 2021.  Saw nephrologist last summer.  Doing well. 3.  Arthralgias of multiple joints.  Doing well.  Advised against NSAIDs.  Use Tylenol as needed for analgesia. Fully vaccinated against Covid. Has no complaints or medical concerns today.  HPI   Prior to Admission medications   Medication Sig Start Date End Date Taking? Authorizing Provider  BEPREVE 1.5 % SOLN 1 drop 2 (two) times daily. 02/29/20  Yes [provider]  clobetasol cream (TEMOVATE) 0.05 % Apply 1 application topically 2 (two) times daily.   Yes [provider]  colchicine 0.6 MG tablet Take 1.2 mg now and 0.6 mg one hour later. Take 0.6 mg daily after that x 5 days. 03/22/20  Yes Heinz Eckert, Eilleen Kempf, MD  Continuous Blood Gluc Sensor (FREESTYLE LIBRE SENSOR SYSTEM) MISC 1 Device by Does not apply route daily. 01/31/20  Yes Ebrahim Deremer, Eilleen Kempf, MD  hydrochlorothiazide (HYDRODIURIL) 25 MG tablet hydrochlorothiazide 25 mg tablet   Yes [provider]  HYDROcodone-acetaminophen (NORCO/VICODIN) 5-325 MG tablet Take 1 tablet by mouth every 6 (six) hours as needed for severe pain. 01/31/20  Yes Dynasia Kercheval, Eilleen Kempf, MD  ibuprofen (ADVIL) 200 MG tablet Take 200 mg by  mouth every 6 (six) hours as needed for moderate pain.   Yes [provider]  indomethacin (INDOCIN) 50 MG capsule Take 1 capsule (50 mg total) by mouth 2 (two) times daily with a meal. 10/12/19  Yes Roxan Hockey, [place] N, PA-C  Lactobacillus (PROBIOTIC ACIDOPHILUS PO) Take 1 capsule by mouth daily.   Yes [provider]  predniSONE (STERAPRED UNI-PAK 21 TAB) 10 MG (21) TBPK tablet Take 6 tabs by mouth daily  for 2 days, then 5 tabs for 2 days, then 4 tabs for 2 days, then 3 tabs for 2 days, 2 tabs for 2 days, then 1 tab by mouth daily for 2 days 07/19/19  Yes Linwood Dibbles, MD  sildenafil (VIAGRA) 100 MG tablet Take 0.5-1 tablets (50-100 mg total) by mouth daily as needed for erectile dysfunction. 04/12/20  Yes Nicky Milhouse, Eilleen Kempf, MD  SYSTANE ULTRA 0.4-0.3 % SOLN SMARTSIG:1 Drop(s) In Eye(s) As Needed 11/23/19  Yes [provider]  timolol (TIMOPTIC) 0.5 % ophthalmic solution  12/26/19  Yes [provider]  glipiZIDE (GLUCOTROL) 5 MG tablet Take 1 tablet (5 mg total) by mouth daily with breakfast. 01/31/20 04/30/20  Georgina Quint, MD    Allergies  Allergen Reactions  . Other Hives and Itching    Peanuts, strawberries, pickles, wine, and corn    Patient Active Problem List   Diagnosis Date Noted  . Severe nonproliferative diabetic retinopathy of left eye, with macular edema, associated with type 2 diabetes mellitus (  HCC) 02/21/2020  . Severe nonproliferative diabetic retinopathy of right eye, with macular edema, associated with type 2 diabetes mellitus (HCC) 02/21/2020  . Nuclear sclerotic cataract of both eyes 02/21/2020  . Retinal hemorrhage of left eye 02/21/2020  . Retinal exudates and deposits 02/21/2020  . Alternating exotropia 02/21/2020  . Diabetes mellitus (HCC) 10/06/2019  . Spinal stenosis 10/06/2019  . History of gout 10/06/2019  . History of rheumatoid arthritis 10/06/2019  . 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 Gout 09/16/2018    Past  Medical History:  Diagnosis Date  . Arthritis   . Cataract   . Diabetes mellitus without complication (HCC)     No past surgical history on file.  Social History   Socioeconomic History  . Marital status: Single    Spouse name: Not on file  . Number of children: Not on file  . Years of education: Not on file  . Highest education level: Not on file  Occupational History  . Not on file  Tobacco Use  . Smoking status: Former Smoker    Years: 4.00    Types: Cigarettes  . Smokeless tobacco: Never Used  Substance and Sexual Activity  . Alcohol use: Yes    Alcohol/week: 3.0 standard drinks    Types: 3 Standard drinks or equivalent per week  . Drug use: Never  . Sexual activity: Not on file  Other Topics Concern  . Not on file  Social History Narrative  . Not on file   Social Determinants of Health   Financial Resource Strain:   . Difficulty of Paying Living Expenses: Not on file  Food Insecurity:   . Worried About Programme researcher, broadcasting/film/video in the Last Year: Not on file  . Ran Out of Food in the Last Year: Not on file  Transportation Needs:   . Lack of Transportation (Medical): Not on file  . Lack of Transportation (Non-Medical): Not on file  Physical Activity:   . Days of Exercise per Week: Not on file  . Minutes of Exercise per Session: Not on file  Stress:   . Feeling of Stress : Not on file  Social Connections:   . Frequency of Communication with Friends and Family: Not on file  . Frequency of Social Gatherings with Friends and Family: Not on file  . Attends Religious Services: Not on file  . Active Member of Clubs or Organizations: Not on file  . Attends Banker Meetings: Not on file  . Marital Status: Not on file  Intimate Partner Violence:   . Fear of Current or Ex-Partner: Not on file  . Emotionally Abused: Not on file  . Physically Abused: Not on file  . Sexually Abused: Not on file    Family History  Problem Relation Age of Onset  . Diabetes  Mother   . Hypertension Mother      Review of Systems  Constitutional: Negative.  Negative for chills and fever.  Eyes: Negative for blurred vision, double vision and pain.  Respiratory: Negative.  Negative for cough and shortness of breath.   Cardiovascular: Negative.  Negative for chest pain and palpitations.  Gastrointestinal: Negative.  Negative for abdominal pain, blood in stool, melena, nausea and vomiting.  Genitourinary: Negative.  Negative for dysuria and hematuria.  Musculoskeletal: Positive for joint pain. Negative for myalgias.  Skin: Positive for rash (Chronic rash to torso and extremities).  Neurological: Negative.  Negative for dizziness and headaches.  All other systems reviewed and are negative.  Today's Vitals   07/30/20 1328  BP: (!) 102/59  Pulse: 63  Resp: 16  Temp: 98.2 F (36.8 C)  TempSrc: Temporal  SpO2: 98%  Weight: 194 lb (88 kg)  Height: 6\' 2"  (1.88 m)   Body mass index is 24.91 kg/m. Wt Readings from Last 3 Encounters:  07/30/20 194 lb (88 kg)  01/31/20 200 lb (90.7 kg)  10/31/19 187 lb (84.8 kg)     Physical Exam Vitals reviewed.  Constitutional:      Appearance: Normal appearance.  HENT:     Head: Normocephalic.  Eyes:     Extraocular Movements: Extraocular movements intact.     Pupils: Pupils are equal, round, and reactive to light.  Cardiovascular:     Rate and Rhythm: Normal rate and regular rhythm.     Heart sounds: Normal heart sounds.  Pulmonary:     Effort: Pulmonary effort is normal.     Breath sounds: Normal breath sounds.  Musculoskeletal:        General: Normal range of motion.     Cervical back: Normal range of motion and neck supple.  Skin:    General: Skin is warm and dry.     Capillary Refill: Capillary refill takes less than 2 seconds.     Findings: Rash present.  Neurological:     General: No focal deficit present.     Mental Status: He is alert and oriented to person, place, and time.  Psychiatric:          Mood and Affect: Mood normal.        Behavior: Behavior normal.     Results for orders placed or performed in visit on 07/30/20 (from the past 24 hour(s))  POCT glucose (manual entry)     Status: None   Collection Time: 07/30/20  1:59 PM  Result Value Ref Range   POC Glucose 98 70 - 99 mg/dl  POCT glycosylated hemoglobin (Hb A1C)     Status: Abnormal   Collection Time: 07/30/20  2:00 PM  Result Value Ref Range   Hemoglobin A1C 6.4 (A) 4.0 - 5.6 %   HbA1c POC (<> result, manual entry)     HbA1c, POC (prediabetic range)     HbA1c, POC (controlled diabetic range)      A total of 30 minutes was spent with the patient, greater than 50% of which was in counseling/coordination of care regarding chronic medical problems including diabetes and cardiovascular risks associated with this condition, review of all medications, review of most recent office visit notes, review of most recent blood work results including today's hemoglobin A1c, diet and nutrition, need for dermatology referral for evaluation of chronic rash, prognosis and need for follow-up.   ASSESSMENT & PLAN: Uncontrolled type 2 diabetes mellitus with hyperglycemia (HCC) Well-controlled diabetes with hemoglobin A1c of 6.4.  Continue present medication no changes. We will start statin therapy with rosuvastatin 10 mg daily. Follow-up in 6 months.  Croy was seen today for diabetes and medication refill.  Diagnoses and all orders for this visit:  Uncontrolled type 2 diabetes mellitus with hyperglycemia (HCC) -     Comprehensive metabolic panel -     CBC with Differential/Platelet -     Lipid panel -     POCT glucose (manual entry) -     POCT glycosylated hemoglobin (Hb A1C) -     rosuvastatin (CRESTOR) 10 MG tablet; Take 1 tablet (10 mg total) by mouth daily.  Severe nonproliferative diabetic retinopathy of left  eye, with macular edema, associated with type 2 diabetes mellitus (HCC)  Severe nonproliferative diabetic  retinopathy of right eye, with macular edema, associated with type 2 diabetes mellitus (HCC)  History of spinal stenosis  Arthralgia of multiple joints  Chronic pruritic rash in adult -     Ambulatory referral to Dermatology  Need for prophylactic vaccination and inoculation against influenza -     Flu Vaccine QUAD High Dose(Fluad)    Patient Instructions       If you have lab work done today you will be contacted with your lab results within the next 2 weeks.  If you have not heard from [place] then please contact us. The fastest way to get your results is to register for My Chart.   IF you received an x-ray today, you will receive an invoice from [facility]. Please contact [facility] at [phone] with questions or concerns regarding your invoice.   IF you received labwork today, you will receive an invoice from [facility]. Please contact [facility] at [phone] with questions or concerns regarding your invoice.   Our billing staff will not be able to assist you with questions regarding bills from these companies.  You will be contacted with the lab results as soon as they are available. The fastest way to get your results is to activate your My Chart account. Instructions are located on the last page of this paperwork. If you have not heard from [place] regarding the results in 2 weeks, please contact this office.     Health Maintenance After Age 56 After age 36, you are at a higher risk for certain long-term diseases and infections as well as injuries from falls. Falls are a major cause of broken bones and head injuries in people who are older than age 19. Getting regular preventive care can help to keep you healthy and well. Preventive care includes getting regular testing and making lifestyle changes as recommended by your health care provider. Talk with your health care provider about:  Which screenings and tests you should have. A screening is a test that checks  for a disease when you have no symptoms.  A diet and exercise plan that is right for you. What should I know about screenings and tests to prevent falls? Screening and testing are the best ways to find a health problem early. Early diagnosis and treatment give you the best chance of managing medical conditions that are common after age 46. Certain conditions and lifestyle choices may make you more likely to have a fall. Your health care provider may recommend:  Regular vision checks. Poor vision and conditions such as cataracts can make you more likely to have a fall. If you wear glasses, make sure to get your prescription updated if your vision changes.  Medicine review. Work with your health care provider to regularly review all of the medicines you are taking, including over-the-counter medicines. Ask your health care provider about any side effects that may make you more likely to have a fall. Tell your health care provider if any medicines that you take make you feel dizzy or sleepy.  Osteoporosis screening. Osteoporosis is a condition that causes the bones to get weaker. This can make the bones weak and cause them to break more easily.  Blood pressure screening. Blood pressure changes and medicines to control blood pressure can make you feel dizzy.  Strength and balance checks. Your health care provider may recommend certain tests to check your strength and balance  while standing, walking, or changing positions.  Foot health exam. Foot pain and numbness, as well as not wearing proper footwear, can make you more likely to have a fall.  Depression screening. You may be more likely to have a fall if you have a fear of falling, feel emotionally low, or feel unable to do activities that you used to do.  Alcohol use screening. Using too much alcohol can affect your balance and may make you more likely to have a fall. What actions can I take to lower my risk of falls? General  instructions  Talk with your health care provider about your risks for falling. Tell your health care provider if: ? You fall. Be sure to tell your health care provider about all falls, even ones that seem minor. ? You feel dizzy, sleepy, or off-balance.  Take over-the-counter and prescription medicines only as told by your health care provider. These include any supplements.  Eat a healthy diet and maintain a healthy weight. A healthy diet includes low-fat dairy products, low-fat (lean) meats, and fiber from whole grains, beans, and lots of fruits and vegetables. Home safety  Remove any tripping hazards, such as rugs, cords, and clutter.  Install safety equipment such as grab bars in bathrooms and safety rails on stairs.  Keep rooms and walkways well-lit. Activity   Follow a regular exercise program to stay fit. This will help you maintain your balance. Ask your health care provider what types of exercise are appropriate for you.  If you need a cane or walker, use it as recommended by your health care provider.  Wear supportive shoes that have nonskid soles. Lifestyle  Do not drink alcohol if your health care provider tells you not to drink.  If you drink alcohol, limit how much you have: ? 0-1 drink a day for women. ? 0-2 drinks a day for men.  Be aware of how much alcohol is in your drink. In the [place] one drink equals one typical bottle of beer (12 oz), one-half glass of wine (5 oz), or one shot of hard liquor (1 oz).  Do not use any products that contain nicotine or tobacco, such as cigarettes and e-cigarettes. If you need help quitting, ask your health care provider. Summary  Having a healthy lifestyle and getting preventive care can help to protect your health and wellness after age 35.  Screening and testing are the best way to find a health problem early and help you avoid having a fall. Early diagnosis and treatment give you the best chance for managing medical  conditions that are more common for people who are older than age 47.  Falls are a major cause of broken bones and head injuries in people who are older than age 81. Take precautions to prevent a fall at home.  Work with your health care provider to learn what changes you can make to improve your health and wellness and to prevent falls. This information is not intended to replace advice given to you by your health care provider. Make sure you discuss any questions you have with your health care provider. Document Revised: 02/24/2019 Document Reviewed: 09/16/2017 Elsevier Patient Education  2020 Elsevier Inc.      Edwina Barth, MD [facility]

## 2020-07-31 LAB — CBC WITH DIFFERENTIAL/PLATELET
Basophils Absolute: 0 10*3/uL (ref 0.0–0.2)
Basos: 1 %
EOS (ABSOLUTE): 0.2 10*3/uL (ref 0.0–0.4)
Eos: 2 %
Hematocrit: 35.4 % — ABNORMAL LOW (ref 37.5–51.0)
Hemoglobin: 11.8 g/dL — ABNORMAL LOW (ref 13.0–17.7)
Immature Grans (Abs): 0 10*3/uL (ref 0.0–0.1)
Immature Granulocytes: 0 %
Lymphocytes Absolute: 1.4 10*3/uL (ref 0.7–3.1)
Lymphs: 21 %
MCH: 29 pg (ref 26.6–33.0)
MCHC: 33.3 g/dL (ref 31.5–35.7)
MCV: 87 fL (ref 79–97)
Monocytes Absolute: 0.6 10*3/uL (ref 0.1–0.9)
Monocytes: 9 %
Neutrophils Absolute: 4.2 10*3/uL (ref 1.4–7.0)
Neutrophils: 67 %
Platelets: 261 10*3/uL (ref 150–450)
RBC: 4.07 x10E6/uL — ABNORMAL LOW (ref 4.14–5.80)
RDW: 15.7 % — ABNORMAL HIGH (ref 11.6–15.4)
WBC: 6.4 10*3/uL (ref 3.4–10.8)

## 2020-07-31 LAB — COMPREHENSIVE METABOLIC PANEL
ALT: 17 IU/L (ref 0–44)
AST: 21 IU/L (ref 0–40)
Albumin/Globulin Ratio: 1.3 (ref 1.2–2.2)
Albumin: 4 g/dL (ref 3.7–4.7)
Alkaline Phosphatase: 73 IU/L (ref 44–121)
BUN/Creatinine Ratio: 23 (ref 10–24)
BUN: 31 mg/dL — ABNORMAL HIGH (ref 8–27)
Bilirubin Total: 0.3 mg/dL (ref 0.0–1.2)
CO2: 20 mmol/L (ref 20–29)
Calcium: 10.1 mg/dL (ref 8.6–10.2)
Chloride: 107 mmol/L — ABNORMAL HIGH (ref 96–106)
Creatinine, Ser: 1.34 mg/dL — ABNORMAL HIGH (ref 0.76–1.27)
GFR calc Af Amer: 59 mL/min/{1.73_m2} — ABNORMAL LOW (ref >59)
GFR calc non Af Amer: 51 mL/min/{1.73_m2} — ABNORMAL LOW (ref >59)
Globulin, Total: 3.2 g/dL (ref 1.5–4.5)
Glucose: 85 mg/dL (ref 65–99)
Potassium: 4.3 mmol/L (ref 3.5–5.2)
Sodium: 141 mmol/L (ref 134–144)
Total Protein: 7.2 g/dL (ref 6.0–8.5)

## 2020-07-31 LAB — LIPID PANEL
Chol/HDL Ratio: 2.3 ratio (ref 0.0–5.0)
Cholesterol, Total: 195 mg/dL (ref 100–199)
HDL: 83 mg/dL (ref >39)
LDL Chol Calc (NIH): 101 mg/dL — ABNORMAL HIGH (ref 0–99)
Triglycerides: 56 mg/dL (ref 0–149)
VLDL Cholesterol Cal: 11 mg/dL (ref 5–40)

## 2020-08-02 DIAGNOSIS — L281 Prurigo nodularis: Secondary | ICD-10-CM | POA: Diagnosis not present

## 2020-08-14 DIAGNOSIS — L281 Prurigo nodularis: Secondary | ICD-10-CM | POA: Diagnosis not present

## 2020-08-22 ENCOUNTER — Other Ambulatory Visit: Payer: Self-pay | Admitting: Emergency Medicine

## 2020-08-27 DIAGNOSIS — H40023 Open angle with borderline findings, high risk, bilateral: Secondary | ICD-10-CM | POA: Diagnosis not present

## 2020-09-04 ENCOUNTER — Ambulatory Visit (INDEPENDENT_AMBULATORY_CARE_PROVIDER_SITE_OTHER): Payer: Medicare Other | Admitting: Ophthalmology

## 2020-09-04 ENCOUNTER — Other Ambulatory Visit: Payer: Self-pay

## 2020-09-04 ENCOUNTER — Encounter (INDEPENDENT_AMBULATORY_CARE_PROVIDER_SITE_OTHER): Payer: Self-pay | Admitting: Ophthalmology

## 2020-09-04 DIAGNOSIS — E113412 Type 2 diabetes mellitus with severe nonproliferative diabetic retinopathy with macular edema, left eye: Secondary | ICD-10-CM | POA: Diagnosis not present

## 2020-09-04 DIAGNOSIS — E113411 Type 2 diabetes mellitus with severe nonproliferative diabetic retinopathy with macular edema, right eye: Secondary | ICD-10-CM | POA: Diagnosis not present

## 2020-09-04 MED ORDER — AFLIBERCEPT 2MG/0.05ML IZ SOLN FOR KALEIDOSCOPE
2.0000 mg | INTRAVITREAL | Status: AC | PRN
  Administered 2020-09-04: 2 mg via INTRAVITREAL

## 2020-09-04 NOTE — Assessment & Plan Note (Signed)
Center involved CSME improving on intravitreal Eylea.  Repeat today.

## 2020-09-04 NOTE — Assessment & Plan Note (Signed)
Noncenter involved CSME OD, slowly worsening, will need focal laser treatment in the coming weeks OD

## 2020-09-04 NOTE — Patient Instructions (Signed)
Patient instructed to notify the office promptly if new onset visual acuity decline or distortion 

## 2020-09-04 NOTE — Progress Notes (Signed)
09/04/2020     CHIEF COMPLAINT Patient presents for Retina Follow Up   HISTORY OF PRESENT ILLNESS: Steven Knapp is a 74 y.o. male who presents to the clinic today for:   HPI    Retina Follow Up    Patient presents with  Diabetic Retinopathy.  In left eye.  This started 9 weeks ago.  Severity is mild.  Duration of 9 weeks.  Since onset it is stable.          Comments    9 Week Diabetic F/U OU, poss Eylea OS  Pt denies noticeable changes to [place] OU since last visit. Pt denies ocular pain, flashes of light, or floaters OU.  LBS: 98 range       Last edited by Ileana Roup, COA on 09/04/2020  8:27 AM. (History)      Referring physician: Georgina Quint, MD [address] [place],  [place] [address]  HISTORICAL INFORMATION:   Selected notes from the MEDICAL RECORD NUMBER    Lab Results  Component Value Date   HGBA1C 6.4 (A) 07/30/2020     CURRENT MEDICATIONS: Current Outpatient Medications (Ophthalmic Drugs)  Medication Sig  . BEPREVE 1.5 % SOLN 1 drop 2 (two) times daily.  . SYSTANE ULTRA 0.4-0.3 % SOLN SMARTSIG:1 Drop(s) In Eye(s) As Needed  . timolol (TIMOPTIC) 0.5 % ophthalmic solution    No current facility-administered medications for this visit. (Ophthalmic Drugs)   Current Outpatient Medications (Other)  Medication Sig  . Acetaminophen (TYLENOL PO) Take by mouth as needed.  . clobetasol cream (TEMOVATE) 0.05 % Apply 1 application topically 2 (two) times daily.  . Continuous Blood Gluc Sensor (FREESTYLE LIBRE SENSOR SYSTEM) MISC 1 Device by Does not apply route daily. (Patient not taking: Reported on 07/30/2020)  . glipiZIDE (GLUCOTROL) 5 MG tablet Take 1 tablet (5 mg total) by mouth daily with breakfast.  . hydrochlorothiazide (HYDRODIURIL) 25 MG tablet hydrochlorothiazide 25 mg tablet  . HYDROcodone-acetaminophen (NORCO/VICODIN) 5-325 MG tablet Take 1 tablet by mouth every 6 (six) hours as needed for severe pain.  Marland Kitchen ibuprofen (ADVIL) 200 MG tablet Take  200 mg by mouth every 6 (six) hours as needed for moderate pain. (Patient not taking: Reported on 07/30/2020)  . indomethacin (INDOCIN) 50 MG capsule Take 1 capsule (50 mg total) by mouth 2 (two) times daily with a meal. (Patient not taking: Reported on 07/30/2020)  . Lactobacillus (PROBIOTIC ACIDOPHILUS PO) Take 1 capsule by mouth daily. (Patient not taking: Reported on 07/30/2020)  . predniSONE (STERAPRED UNI-PAK 21 TAB) 10 MG (21) TBPK tablet Take 6 tabs by mouth daily  for 2 days, then 5 tabs for 2 days, then 4 tabs for 2 days, then 3 tabs for 2 days, 2 tabs for 2 days, then 1 tab by mouth daily for 2 days (Patient not taking: Reported on 07/30/2020)  . rosuvastatin (CRESTOR) 10 MG tablet Take 1 tablet (10 mg total) by mouth daily.  . sildenafil (VIAGRA) 100 MG tablet TAKE ONE-HALF TO ONE TABLET BY MOUTH DAILY AS NEEDED FOR ERECTILE DYSFUNCTION   No current facility-administered medications for this visit. (Other)      REVIEW OF SYSTEMS:    ALLERGIES Allergies  Allergen Reactions  . Other Hives and Itching    Peanuts, strawberries, pickles, wine, and corn    PAST MEDICAL HISTORY Past Medical History:  Diagnosis Date  . Arthritis   . Cataract   . Diabetes mellitus without complication (HCC)    History reviewed. No pertinent  surgical history.  FAMILY HISTORY Family History  Problem Relation Age of Onset  . Diabetes Mother   . Hypertension Mother     SOCIAL HISTORY Social History   Tobacco Use  . Smoking status: Former Smoker    Years: 4.00    Types: Cigarettes  . Smokeless tobacco: Never Used  Substance Use Topics  . Alcohol use: Yes    Alcohol/week: 3.0 standard drinks    Types: 3 Standard drinks or equivalent per week  . Drug use: Never         OPHTHALMIC EXAM:  Base Eye Exam    Visual Acuity (ETDRS)      Right Left   Dist [place] 20/25 +2 20/50 +2   Dist ph [place]  20/40 -1       Tonometry (Tonopen, 8:27 AM)      Right Left   Pressure 14 12       Pupils       Pupils Dark Light Shape React APD   Right PERRL 3 3 Round Minimal None   Left PERRL 3 3 Round Minimal None       Visual Fields (Counting fingers)      Left Right    Full Full       Extraocular Movement      Right Left    Full Full       Neuro/Psych    Oriented x3: Yes   Mood/Affect: Normal       Dilation    Both eyes: 1.0% Mydriacyl, 2.5% Phenylephrine @ 8:31 AM        Slit Lamp and Fundus Exam    External Exam      Right Left   External Normal Normal       Slit Lamp Exam      Right Left   Lids/Lashes Normal Normal   Conjunctiva/Sclera White and quiet White and quiet   Cornea Clear Clear   Anterior Chamber Deep and quiet Deep and quiet   Iris Round and reactive Round and reactive   Lens 2+ Nuclear sclerosis 2+ Nuclear sclerosis   Anterior Vitreous Normal Normal       Fundus Exam      Right Left   Posterior Vitreous Normal Central vitreous floaters   Disc Normal Normal   C/D Ratio 0.5 0.5   Macula Mild clinically significant macular edema, thickening superior to the fovea Mild clinically significant macular edema, Exudates   Vessels NPDR-Severe NPDR severe   Periphery Normal Normal          IMAGING AND PROCEDURES  Imaging and Procedures for 09/04/20  OCT, Retina - OU - Both Eyes       Right Eye Quality was good. Scan locations included subfoveal. Central Foveal Thickness: 314. Progression has worsened. Findings include abnormal foveal contour.   Left Eye Quality was good. Scan locations included subfoveal. Central Foveal Thickness: 337. Progression has improved. Findings include abnormal foveal contour.   Notes  Od, Focal CSME superiorly continues to slowly increase in size, not center involved, will treat in 3 weeks with focal to prevent extension into center of fovea  OS, center involved CSME continues to slowly improve on intravitreal Eylea.  Today at 9 weeks.  We will repeat injection today And examination left eye in 9 weeks         Intravitreal Injection, Pharmacologic Agent - OS - Left Eye       Time Out 09/04/2020. 9:26 AM. Confirmed correct patient, procedure, site,  and patient consented.   Anesthesia Topical anesthesia was used. Anesthetic medications included Akten 3.5%.   Procedure Preparation included Ofloxacin , Tobramycin 0.3%, 10% betadine to eyelids, 5% betadine to ocular surface. A 30 gauge needle was used.   Injection:  2 mg aflibercept [name]) SOLN   NDC: [id number], Lot: [id number]   Route: Intravitreal, Site: Left Eye, Waste: 0 mg  Post-op Post injection exam found visual acuity of at least counting fingers. The patient tolerated the procedure well. There were no complications. The patient received written and verbal post procedure care education. Post injection medications were not given.                 ASSESSMENT/PLAN:  Severe nonproliferative diabetic retinopathy of right eye, with macular edema, associated with type 2 diabetes mellitus (HCC) Noncenter involved CSME OD, slowly worsening, will need focal laser treatment in the coming weeks OD  Severe nonproliferative diabetic retinopathy of left eye, with macular edema, associated with type 2 diabetes mellitus (HCC) Center involved CSME improving on intravitreal Eylea.  Repeat today.      ICD-10-CM   1. Severe nonproliferative diabetic retinopathy of left eye, with macular edema, associated with type 2 diabetes mellitus (HCC)  E11.3412 OCT, Retina - OU - Both Eyes    Intravitreal Injection, Pharmacologic Agent - OS - Left Eye    aflibercept (EYLEA) SOLN 2 mg  2. Severe nonproliferative diabetic retinopathy of right eye, with macular edema, associated with type 2 diabetes mellitus (HCC)  E11.3411     1.  OS, repeat intravitreal Eylea today for improving center involved CSME.  Follow-up OS in 9 weeks for repeat examination  2.  OD, and increasing CSME noncenter involved superior to the fovea, will need focal laser treatment in  the near future  3.  Ophthalmic Meds Ordered this visit:  Meds ordered this encounter  Medications  . aflibercept (EYLEA) SOLN 2 mg       Return in about 3 weeks (around 09/25/2020) for OD, dilate, FOCAL.  Patient Instructions  Patient instructed to notify the office promptly if new onset visual acuity decline or distortion    Explained the diagnoses, plan, and follow up with the patient and they expressed understanding.  Patient expressed understanding of the importance of proper follow up care.   Alford Highland Demarus Latterell M.D. Diseases & Surgery of the Retina and Vitreous [facility] 09/04/20     Abbreviations: M myopia (nearsighted); A astigmatism; H hyperopia (farsighted); P presbyopia; Mrx spectacle prescription;  CTL contact lenses; OD right eye; OS left eye; OU both eyes  XT exotropia; ET esotropia; PEK punctate epithelial keratitis; PEE punctate epithelial erosions; DES dry eye syndrome; MGD meibomian gland dysfunction; ATs artificial tears; PFAT's preservative free artificial tears; NSC nuclear sclerotic cataract; PSC posterior subcapsular cataract; ERM epi-retinal membrane; PVD posterior vitreous detachment; RD retinal detachment; DM diabetes mellitus; DR diabetic retinopathy; NPDR non-proliferative diabetic retinopathy; PDR proliferative diabetic retinopathy; CSME clinically significant macular edema; DME diabetic macular edema; dbh dot blot hemorrhages; CWS cotton wool spot; POAG primary open angle glaucoma; C/D cup-to-disc ratio; HVF humphrey visual field; GVF goldmann visual field; OCT optical coherence tomography; IOP intraocular pressure; BRVO Branch retinal vein occlusion; CRVO central retinal vein occlusion; CRAO central retinal artery occlusion; BRAO branch retinal artery occlusion; RT retinal tear; SB scleral buckle; PPV pars plana vitrectomy; VH Vitreous hemorrhage; PRP panretinal laser photocoagulation; IVK intravitreal kenalog; VMT vitreomacular traction; MH  Macular hole;  NVD neovascularization of the disc; NVE neovascularization  elsewhere; AREDS age related eye disease study; ARMD age related macular degeneration; POAG primary open angle glaucoma; EBMD epithelial/anterior basement membrane dystrophy; ACIOL anterior chamber intraocular lens; IOL intraocular lens; PCIOL posterior chamber intraocular lens; Phaco/IOL phacoemulsification with intraocular lens placement; PRK photorefractive keratectomy; LASIK laser assisted in situ keratomileusis; HTN hypertension; DM diabetes mellitus; COPD chronic obstructive pulmonary disease 

## 2020-09-24 ENCOUNTER — Other Ambulatory Visit: Payer: Self-pay | Admitting: Emergency Medicine

## 2020-09-24 DIAGNOSIS — E1169 Type 2 diabetes mellitus with other specified complication: Secondary | ICD-10-CM

## 2020-09-24 MED ORDER — GLIPIZIDE 5 MG PO TABS: 5.0000 mg | ORAL_TABLET | Freq: Every day | ORAL | 3 refills | Status: DC

## 2020-09-24 NOTE — Telephone Encounter (Signed)
Requested glipizide expired 04/30/20- pt also requesting glucometer, strips and lancets (not on active med list)

## 2020-09-24 NOTE — Telephone Encounter (Signed)
Copied from CRM [id number]. Topic: Quick Communication - Rx Refill/Question >> Sep 24, 2020 11:29 AM Jaquita Rector A wrote: Medication: glipiZIDE (GLUCOTROL) 5 MG tablet, Glucometer with strips, and lancets  Has the patient contacted their pharmacy? Yes.   (Agent: If no, request that the patient contact the pharmacy for the refill.) (Agent: If yes, when and what did the pharmacy advise?)  Preferred Pharmacy (with phone number or street name): [facility] - [place], [place] - [address] [place], [address]  Phone:  [phone] Fax:  [phone]     Agent: Please be advised that RX refills may take up to 3 business days. We ask that you follow-up with your pharmacy.

## 2020-09-25 ENCOUNTER — Ambulatory Visit (INDEPENDENT_AMBULATORY_CARE_PROVIDER_SITE_OTHER): Payer: Medicare Other | Admitting: Ophthalmology

## 2020-09-25 ENCOUNTER — Encounter (INDEPENDENT_AMBULATORY_CARE_PROVIDER_SITE_OTHER): Payer: Self-pay | Admitting: Ophthalmology

## 2020-09-25 ENCOUNTER — Other Ambulatory Visit: Payer: Self-pay

## 2020-09-25 DIAGNOSIS — E113412 Type 2 diabetes mellitus with severe nonproliferative diabetic retinopathy with macular edema, left eye: Secondary | ICD-10-CM

## 2020-09-25 DIAGNOSIS — E113411 Type 2 diabetes mellitus with severe nonproliferative diabetic retinopathy with macular edema, right eye: Secondary | ICD-10-CM | POA: Diagnosis not present

## 2020-09-25 NOTE — Progress Notes (Addendum)
09/25/2020     CHIEF COMPLAINT Patient presents for Retina Follow Up   HISTORY OF PRESENT ILLNESS: Steven Knapp is a 74 y.o. male who presents to the clinic today for:   HPI    Retina Follow Up    Patient presents with  Diabetic Retinopathy.  In right eye.  This started 3 weeks ago.  Severity is mild.  Duration of 3 weeks.  Since onset it is stable.          Comments    3 Week Focal OD  Pt denies noticeable changes to [place] OU since last visit. Pt denies ocular pain, flashes of light, or floaters OU.  LBS: does not recall       Last edited by Ileana Roup, COA on 09/25/2020  8:03 AM. (History)      Referring physician: Georgina Quint, MD [address] [place],  [place] [address]  HISTORICAL INFORMATION:   Selected notes from the MEDICAL RECORD NUMBER    Lab Results  Component Value Date   HGBA1C 6.4 (A) 07/30/2020     CURRENT MEDICATIONS: Current Outpatient Medications (Ophthalmic Drugs)  Medication Sig  . BEPREVE 1.5 % SOLN 1 drop 2 (two) times daily.  . SYSTANE ULTRA 0.4-0.3 % SOLN SMARTSIG:1 Drop(s) In Eye(s) As Needed  . timolol (TIMOPTIC) 0.5 % ophthalmic solution    No current facility-administered medications for this visit. (Ophthalmic Drugs)   Current Outpatient Medications (Other)  Medication Sig  . Acetaminophen (TYLENOL PO) Take by mouth as needed.  . clobetasol cream (TEMOVATE) 0.05 % Apply 1 application topically 2 (two) times daily.  . Continuous Blood Gluc Sensor (FREESTYLE LIBRE SENSOR SYSTEM) MISC 1 Device by Does not apply route daily. (Patient not taking: Reported on 07/30/2020)  . glipiZIDE (GLUCOTROL) 5 MG tablet Take 1 tablet (5 mg total) by mouth daily with breakfast.  . hydrochlorothiazide (HYDRODIURIL) 25 MG tablet hydrochlorothiazide 25 mg tablet  . HYDROcodone-acetaminophen (NORCO/VICODIN) 5-325 MG tablet Take 1 tablet by mouth every 6 (six) hours as needed for severe pain.  Marland Kitchen ibuprofen (ADVIL) 200 MG tablet Take 200 mg by mouth  every 6 (six) hours as needed for moderate pain. (Patient not taking: Reported on 07/30/2020)  . indomethacin (INDOCIN) 50 MG capsule Take 1 capsule (50 mg total) by mouth 2 (two) times daily with a meal. (Patient not taking: Reported on 07/30/2020)  . Lactobacillus (PROBIOTIC ACIDOPHILUS PO) Take 1 capsule by mouth daily. (Patient not taking: Reported on 07/30/2020)  . predniSONE (STERAPRED UNI-PAK 21 TAB) 10 MG (21) TBPK tablet Take 6 tabs by mouth daily  for 2 days, then 5 tabs for 2 days, then 4 tabs for 2 days, then 3 tabs for 2 days, 2 tabs for 2 days, then 1 tab by mouth daily for 2 days (Patient not taking: Reported on 07/30/2020)  . rosuvastatin (CRESTOR) 10 MG tablet Take 1 tablet (10 mg total) by mouth daily.  . sildenafil (VIAGRA) 100 MG tablet TAKE ONE-HALF TO ONE TABLET BY MOUTH DAILY AS NEEDED FOR ERECTILE DYSFUNCTION   No current facility-administered medications for this visit. (Other)      REVIEW OF SYSTEMS:    ALLERGIES Allergies  Allergen Reactions  . Other Hives and Itching    Peanuts, strawberries, pickles, wine, and corn    PAST MEDICAL HISTORY Past Medical History:  Diagnosis Date  . Arthritis   . Cataract   . Diabetes mellitus without complication (HCC)    History reviewed. No pertinent surgical history.  FAMILY HISTORY Family History  Problem Relation Age of Onset  . Diabetes Mother   . Hypertension Mother     SOCIAL HISTORY Social History   Tobacco Use  . Smoking status: Former Smoker    Years: 4.00    Types: Cigarettes  . Smokeless tobacco: Never Used  Substance Use Topics  . Alcohol use: Yes    Alcohol/week: 3.0 standard drinks    Types: 3 Standard drinks or equivalent per week  . Drug use: Never         OPHTHALMIC EXAM:  Base Eye Exam    Visual Acuity (ETDRS)      Right Left   Dist [place] 20/25 20/30 +2   Dist ph   NI       Tonometry (Tonopen, 8:04 AM)      Right Left   Pressure 09 06       Pupils      Pupils Dark Light  Shape React APD   Right PERRL 3 3 Round Minimal None   Left PERRL 3 3 Round Minimal None       Visual Fields (Counting fingers)      Left Right    Full Full       Extraocular Movement      Right Left    Full Full       Neuro/Psych    Oriented x3: Yes   Mood/Affect: Normal       Dilation    Right eye: 1.0% Mydriacyl, 2.5% Phenylephrine @ 8:07 AM          IMAGING AND PROCEDURES  Imaging and Procedures for 09/25/20  Focal Laser - OD - Right Eye       Time Out Confirmed correct patient, procedure, site, and patient consented.   Anesthesia Topical anesthesia was used. Anesthetic medications included Proparacaine 0.5%.   Laser Information The type of laser was diode. Color was yellow. The duration in seconds was 0.1. The spot size was 100 microns. Laser power was 60. Total spots was 61.   Post-op The patient tolerated the procedure well. There were no complications. The patient received written and verbal post procedure care education.   Notes Focal applied in a grid pattern superior to the fovea, outside 500 m from center       OCT, Retina - OU - Both Eyes       Right Eye Quality was good. Central Foveal Thickness: 318. Progression has been stable.   Left Eye Quality was good. Scan locations included subfoveal. Central Foveal Thickness: 315. Progression has improved.   Notes CSME, noncenter involved right eye, superior thickening, will treat with focal laser right eye today  OS with CSME center involved, mostly temporal to the fovea, improved now some 3 weeks post intravitreal Avastin follow-up OS as scheduled                ASSESSMENT/PLAN:  Severe nonproliferative diabetic retinopathy of left eye, with macular edema, associated with type 2 diabetes mellitus (HCC) Improving on intravitreal Avastin for center involvement  Severe nonproliferative diabetic retinopathy of right eye, with macular edema, associated with type 2 diabetes mellitus  (HCC) Not center involved CSME superiorly, focal laser today      ICD-10-CM   1. Severe nonproliferative diabetic retinopathy of right eye, with macular edema, associated with type 2 diabetes mellitus (HCC)  Y63.7858 Focal Laser - OD - Right Eye    OCT, Retina - OU - Both Eyes  2. Severe nonproliferative diabetic  retinopathy of left eye, with macular edema, associated with type 2 diabetes mellitus (HCC)  X32.4401     1.  Center involved CSME OD, focal laser treatment right eye today completed uncomplicated fashion  2.  Left eye as scheduled dilate possible Avastin OS  3.  We will repeat dilation right eye in 4 months  Ophthalmic Meds Ordered this visit:  No orders of the defined types were placed in this encounter.      Return in 2 weeks (on 10/09/2020) for As scheduled, AVASTIN OCT, dilate, OS.  There are no Patient Instructions on file for this visit.   Explained the diagnoses, plan, and follow up with the patient and they expressed understanding.  Patient expressed understanding of the importance of proper follow up care.   Alford Highland Bronco Mcgrory M.D. Diseases & Surgery of the Retina and Vitreous [facility] 09/25/20     Abbreviations: M myopia (nearsighted); A astigmatism; H hyperopia (farsighted); P presbyopia; Mrx spectacle prescription;  CTL contact lenses; OD right eye; OS left eye; OU both eyes  XT exotropia; ET esotropia; PEK punctate epithelial keratitis; PEE punctate epithelial erosions; DES dry eye syndrome; MGD meibomian gland dysfunction; ATs artificial tears; PFAT's preservative free artificial tears; NSC nuclear sclerotic cataract; PSC posterior subcapsular cataract; ERM epi-retinal membrane; PVD posterior vitreous detachment; RD retinal detachment; DM diabetes mellitus; DR diabetic retinopathy; NPDR non-proliferative diabetic retinopathy; PDR proliferative diabetic retinopathy; CSME clinically significant macular edema; DME diabetic macular edema; dbh  dot blot hemorrhages; CWS cotton wool spot; POAG primary open angle glaucoma; C/D cup-to-disc ratio; HVF humphrey visual field; GVF goldmann visual field; OCT optical coherence tomography; IOP intraocular pressure; BRVO Branch retinal vein occlusion; CRVO central retinal vein occlusion; CRAO central retinal artery occlusion; BRAO branch retinal artery occlusion; RT retinal tear; SB scleral buckle; PPV pars plana vitrectomy; VH Vitreous hemorrhage; PRP panretinal laser photocoagulation; IVK intravitreal kenalog; VMT vitreomacular traction; MH Macular hole;  NVD neovascularization of the disc; NVE neovascularization elsewhere; AREDS age related eye disease study; ARMD age related macular degeneration; POAG primary open angle glaucoma; EBMD epithelial/anterior basement membrane dystrophy; ACIOL anterior chamber intraocular lens; IOL intraocular lens; PCIOL posterior chamber intraocular lens; Phaco/IOL phacoemulsification with intraocular lens placement; PRK photorefractive keratectomy; LASIK laser assisted in situ keratomileusis; HTN hypertension; DM diabetes mellitus; COPD chronic obstructive pulmonary disease

## 2020-09-25 NOTE — Assessment & Plan Note (Signed)
Not center involved CSME superiorly, focal laser today

## 2020-09-25 NOTE — Assessment & Plan Note (Signed)
Improving on intravitreal Avastin for center involvement

## 2020-10-04 DIAGNOSIS — L281 Prurigo nodularis: Secondary | ICD-10-CM | POA: Diagnosis not present

## 2020-10-09 ENCOUNTER — Ambulatory Visit (INDEPENDENT_AMBULATORY_CARE_PROVIDER_SITE_OTHER): Payer: Medicare Other | Admitting: Ophthalmology

## 2020-10-09 ENCOUNTER — Other Ambulatory Visit: Payer: Self-pay

## 2020-10-09 DIAGNOSIS — E113411 Type 2 diabetes mellitus with severe nonproliferative diabetic retinopathy with macular edema, right eye: Secondary | ICD-10-CM

## 2020-10-09 DIAGNOSIS — E113412 Type 2 diabetes mellitus with severe nonproliferative diabetic retinopathy with macular edema, left eye: Secondary | ICD-10-CM

## 2020-10-09 MED ORDER — BEVACIZUMAB CHEMO INJECTION 1.25MG/0.05ML SYRINGE FOR KALEIDOSCOPE
1.2500 mg | INTRAVITREAL | Status: AC | PRN
  Administered 2020-10-09: 1.25 mg via INTRAVITREAL

## 2020-10-09 NOTE — Assessment & Plan Note (Signed)
OS at 5-week interval, CSME persists, with only minor improvement, will repeat injection Avastin today and follow-up in 6 weeks for exam

## 2020-10-09 NOTE — Assessment & Plan Note (Signed)
2-week status post focal laser treatment superiorly OD, stable

## 2020-10-09 NOTE — Progress Notes (Signed)
10/09/2020     CHIEF COMPLAINT Patient presents for Retina Follow Up   HISTORY OF PRESENT ILLNESS: Steven Knapp is a 74 y.o. male who presents to the clinic today for:   HPI    Retina Follow Up    Patient presents with  Diabetic Retinopathy.  In left eye.  This started 5 weeks ago.  Severity is mild.  Duration of 5 weeks.  Since onset it is gradually improving.          Comments    5 WK F/U OS, POSS AVASTIN OS   Pt reports vision improving, "I'm seeing a little clearer"        Last edited by Varney Biles D on 10/09/2020  9:05 AM. (History)      Referring physician: Georgina Quint, MD [address] [place],  [place] [address]  HISTORICAL INFORMATION:   Selected notes from the MEDICAL RECORD NUMBER    Lab Results  Component Value Date   HGBA1C 6.4 (A) 07/30/2020     CURRENT MEDICATIONS: Current Outpatient Medications (Ophthalmic Drugs)  Medication Sig  . BEPREVE 1.5 % SOLN 1 drop 2 (two) times daily.  . timolol (TIMOPTIC) 0.5 % ophthalmic solution Place into both eyes daily.   . SYSTANE ULTRA 0.4-0.3 % SOLN SMARTSIG:1 Drop(s) In Eye(s) As Needed   No current facility-administered medications for this visit. (Ophthalmic Drugs)   Current Outpatient Medications (Other)  Medication Sig  . Acetaminophen (TYLENOL PO) Take by mouth as needed.  . clobetasol cream (TEMOVATE) 0.05 % Apply 1 application topically 2 (two) times daily.  . Continuous Blood Gluc Sensor (FREESTYLE LIBRE SENSOR SYSTEM) MISC 1 Device by Does not apply route daily. (Patient not taking: Reported on 07/30/2020)  . glipiZIDE (GLUCOTROL) 5 MG tablet Take 1 tablet (5 mg total) by mouth daily with breakfast.  . hydrochlorothiazide (HYDRODIURIL) 25 MG tablet hydrochlorothiazide 25 mg tablet  . HYDROcodone-acetaminophen (NORCO/VICODIN) 5-325 MG tablet Take 1 tablet by mouth every 6 (six) hours as needed for severe pain.  Marland Kitchen ibuprofen (ADVIL) 200 MG tablet Take 200 mg by mouth every 6 (six) hours  as needed for moderate pain. (Patient not taking: Reported on 07/30/2020)  . indomethacin (INDOCIN) 50 MG capsule Take 1 capsule (50 mg total) by mouth 2 (two) times daily with a meal. (Patient not taking: Reported on 07/30/2020)  . Lactobacillus (PROBIOTIC ACIDOPHILUS PO) Take 1 capsule by mouth daily. (Patient not taking: Reported on 07/30/2020)  . predniSONE (STERAPRED UNI-PAK 21 TAB) 10 MG (21) TBPK tablet Take 6 tabs by mouth daily  for 2 days, then 5 tabs for 2 days, then 4 tabs for 2 days, then 3 tabs for 2 days, 2 tabs for 2 days, then 1 tab by mouth daily for 2 days (Patient not taking: Reported on 07/30/2020)  . rosuvastatin (CRESTOR) 10 MG tablet Take 1 tablet (10 mg total) by mouth daily.  . sildenafil (VIAGRA) 100 MG tablet TAKE ONE-HALF TO ONE TABLET BY MOUTH DAILY AS NEEDED FOR ERECTILE DYSFUNCTION   No current facility-administered medications for this visit. (Other)      REVIEW OF SYSTEMS:    ALLERGIES Allergies  Allergen Reactions  . Other Hives and Itching    Peanuts, strawberries, pickles, wine, and corn    PAST MEDICAL HISTORY Past Medical History:  Diagnosis Date  . Arthritis   . Cataract   . Diabetes mellitus without complication (HCC)    No past surgical history on file.  FAMILY HISTORY Family History  Problem Relation Age of Onset  . Diabetes Mother   . Hypertension Mother     SOCIAL HISTORY Social History   Tobacco Use  . Smoking status: Former Smoker    Years: 4.00    Types: Cigarettes  . Smokeless tobacco: Never Used  Substance Use Topics  . Alcohol use: Yes    Alcohol/week: 3.0 standard drinks    Types: 3 Standard drinks or equivalent per week  . Drug use: Never         OPHTHALMIC EXAM: Base Eye Exam    Visual Acuity (ETDRS)      Right Left   Dist [place] 20/20 -2 20/30 +2   Dist ph [place]  NI       Tonometry (Tonopen, 9:11 AM)      Right Left   Pressure 12 12       Pupils      Pupils Dark Light Shape React APD   Right PERRL 3 3  Round Minimal None   Left PERRL 3 3 Round Minimal None       Visual Fields (Counting fingers)      Left Right    Full Full       Extraocular Movement      Right Left    Full Full       Neuro/Psych    Oriented x3: Yes   Mood/Affect: Normal       Dilation    Left eye: 1.0% Mydriacyl, 2.5% Phenylephrine @ 9:12 AM        Slit Lamp and Fundus Exam    External Exam      Right Left   External Normal Normal       Slit Lamp Exam      Right Left   Lids/Lashes Normal Normal   Conjunctiva/Sclera White and quiet White and quiet   Cornea Clear Clear   Anterior Chamber Deep and quiet Deep and quiet   Iris Round and reactive Round and reactive   Lens 2+ Nuclear sclerosis 2+ Nuclear sclerosis   Anterior Vitreous Normal Normal       Fundus Exam      Right Left   Posterior Vitreous  Normal   Disc  Normal   C/D Ratio  0.5   Macula  Mild clinically significant macular edema, Exudates   Vessels  NPDR severe   Periphery  Normal          IMAGING AND PROCEDURES  Imaging and Procedures for 10/09/20  OCT, Retina - OU - Both Eyes       Right Eye Quality was good. Scan locations included subfoveal. Central Foveal Thickness: 325. Progression has been stable.   Left Eye Quality was good. Scan locations included subfoveal. Central Foveal Thickness: 361. Progression has been stable.   Notes CSME superiorly, recent focal laser treatment, stable, OD  OS with persistent CSME temporally, will retreat with intravitreal Avastin OS today       Intravitreal Injection, Pharmacologic Agent - OS - Left Eye       Time Out 10/09/2020. 10:07 AM. Confirmed correct patient, procedure, site, and patient consented.   Anesthesia Topical anesthesia was used. Anesthetic medications included Akten 3.5%.   Procedure Preparation included Ofloxacin , Tobramycin 0.3%, 10% betadine to eyelids, 5% betadine to ocular surface. A 30 gauge needle was used.   Injection:  1.25 mg Bevacizumab  (AVASTIN) SOLN   NDC: 70360-001-02   Route: Intravitreal, Site: Left Eye, Waste: 0 mg  Post-op Post injection exam  found visual acuity of at least counting fingers. The patient tolerated the procedure well. There were no complications. The patient received written and verbal post procedure care education. Post injection medications were not given.                 ASSESSMENT/PLAN:  Severe nonproliferative diabetic retinopathy of right eye, with macular edema, associated with type 2 diabetes mellitus (HCC) 2-week status post focal laser treatment superiorly OD, stable  Severe nonproliferative diabetic retinopathy of left eye, with macular edema, associated with type 2 diabetes mellitus (HCC) OS at 5-week interval, CSME persists, with only minor improvement, will repeat injection Avastin today and follow-up in 6 weeks for exam      ICD-10-CM   1. Severe nonproliferative diabetic retinopathy of left eye, with macular edema, associated with type 2 diabetes mellitus (HCC)  E11.3412 OCT, Retina - OU - Both Eyes    Intravitreal Injection, Pharmacologic Agent - OS - Left Eye    Bevacizumab (AVASTIN) SOLN 1.25 mg  2. Severe nonproliferative diabetic retinopathy of right eye, with macular edema, associated with type 2 diabetes mellitus (HCC)  E11.3411     1. 2-week status post focal laser treatment OD, stable  2. 5 weeks status post intravitreal Avastin OS, slightly improved CSME temporally, will repeat need to repeat injection OS today  3.  Ophthalmic Meds Ordered this visit:  Meds ordered this encounter  Medications  . Bevacizumab (AVASTIN) SOLN 1.25 mg       Return in about 6 weeks (around 11/20/2020) for dilate, OS, AVASTIN OCT.  There are no Patient Instructions on file for this visit.   Explained the diagnoses, plan, and follow up with the patient and they expressed understanding.  Patient expressed understanding of the importance of proper follow up care.   Alford Highland  Benancio Osmundson M.D. Diseases & Surgery of the Retina and Vitreous [facility] 10/09/20     Abbreviations: M myopia (nearsighted); A astigmatism; H hyperopia (farsighted); P presbyopia; Mrx spectacle prescription;  CTL contact lenses; OD right eye; OS left eye; OU both eyes  XT exotropia; ET esotropia; PEK punctate epithelial keratitis; PEE punctate epithelial erosions; DES dry eye syndrome; MGD meibomian gland dysfunction; ATs artificial tears; PFAT's preservative free artificial tears; NSC nuclear sclerotic cataract; PSC posterior subcapsular cataract; ERM epi-retinal membrane; PVD posterior vitreous detachment; RD retinal detachment; DM diabetes mellitus; DR diabetic retinopathy; NPDR non-proliferative diabetic retinopathy; PDR proliferative diabetic retinopathy; CSME clinically significant macular edema; DME diabetic macular edema; dbh dot blot hemorrhages; CWS cotton wool spot; POAG primary open angle glaucoma; C/D cup-to-disc ratio; HVF humphrey visual field; GVF goldmann visual field; OCT optical coherence tomography; IOP intraocular pressure; BRVO Branch retinal vein occlusion; CRVO central retinal vein occlusion; CRAO central retinal artery occlusion; BRAO branch retinal artery occlusion; RT retinal tear; SB scleral buckle; PPV pars plana vitrectomy; VH Vitreous hemorrhage; PRP panretinal laser photocoagulation; IVK intravitreal kenalog; VMT vitreomacular traction; MH Macular hole;  NVD neovascularization of the disc; NVE neovascularization elsewhere; AREDS age related eye disease study; ARMD age related macular degeneration; POAG primary open angle glaucoma; EBMD epithelial/anterior basement membrane dystrophy; ACIOL anterior chamber intraocular lens; IOL intraocular lens; PCIOL posterior chamber intraocular lens; Phaco/IOL phacoemulsification with intraocular lens placement; PRK photorefractive keratectomy; LASIK laser assisted in situ keratomileusis; HTN hypertension; DM diabetes  mellitus; COPD chronic obstructive pulmonary disease

## 2020-11-12 DIAGNOSIS — N1831 Chronic kidney disease, stage 3a: Secondary | ICD-10-CM | POA: Diagnosis not present

## 2020-11-12 DIAGNOSIS — D509 Iron deficiency anemia, unspecified: Secondary | ICD-10-CM | POA: Diagnosis not present

## 2020-11-20 ENCOUNTER — Ambulatory Visit (INDEPENDENT_AMBULATORY_CARE_PROVIDER_SITE_OTHER): Payer: Medicare HMO | Admitting: Ophthalmology

## 2020-11-20 ENCOUNTER — Encounter (INDEPENDENT_AMBULATORY_CARE_PROVIDER_SITE_OTHER): Payer: Self-pay | Admitting: Ophthalmology

## 2020-11-20 ENCOUNTER — Other Ambulatory Visit: Payer: Self-pay

## 2020-11-20 DIAGNOSIS — E113412 Type 2 diabetes mellitus with severe nonproliferative diabetic retinopathy with macular edema, left eye: Secondary | ICD-10-CM

## 2020-11-20 DIAGNOSIS — E113411 Type 2 diabetes mellitus with severe nonproliferative diabetic retinopathy with macular edema, right eye: Secondary | ICD-10-CM

## 2020-11-20 MED ORDER — BEVACIZUMAB 2.5 MG/0.1ML IZ SOSY
2.5000 mg | PREFILLED_SYRINGE | INTRAVITREAL | Status: AC | PRN
  Administered 2020-11-20: 2.5 mg via INTRAVITREAL

## 2020-11-20 NOTE — Assessment & Plan Note (Signed)
OD status post focal for extra foveal CSME superiorly, continued slow improvement maintained and demonstrable by OCT

## 2020-11-20 NOTE — Assessment & Plan Note (Signed)
Improved CSME, at 6-week interval today.  Repeat injection today for further improvement.  Overall the severe NPDR is also stable.

## 2020-11-20 NOTE — Progress Notes (Signed)
11/20/2020     CHIEF COMPLAINT Patient presents for Retina Follow Up (6 Week F/U OS, poss Avastin OS//Pt denies noticeable changes to [place] OU since last visit. Pt denies ocular pain, flashes of light, or floaters OU. //LBS: 136 last week)   HISTORY OF PRESENT ILLNESS: Steven Knapp is a 75 y.o. male who presents to the clinic today for:   HPI    Retina Follow Up    Patient presents with  Diabetic Retinopathy.  In left eye.  This started 6 weeks ago.  Severity is mild.  Duration of 6 weeks.  Since onset it is stable. Additional comments: 6 Week F/U OS, poss Avastin OS  Pt denies noticeable changes to [place] OU since last visit. Pt denies ocular pain, flashes of light, or floaters OU.   LBS: 136 last week       Last edited by Ileana Roup, COA on 11/20/2020 10:21 AM. (History)      Referring physician: Georgina Quint, MD [address] [place],  [place] [address]  HISTORICAL INFORMATION:   Selected notes from the MEDICAL RECORD NUMBER    Lab Results  Component Value Date   HGBA1C 6.4 (A) 07/30/2020     CURRENT MEDICATIONS: Current Outpatient Medications (Ophthalmic Drugs)  Medication Sig  . BEPREVE 1.5 % SOLN 1 drop 2 (two) times daily.  . SYSTANE ULTRA 0.4-0.3 % SOLN SMARTSIG:1 Drop(s) In Eye(s) As Needed  . timolol (TIMOPTIC) 0.5 % ophthalmic solution Place into both eyes daily.    No current facility-administered medications for this visit. (Ophthalmic Drugs)   Current Outpatient Medications (Other)  Medication Sig  . Acetaminophen (TYLENOL PO) Take by mouth as needed.  . clobetasol cream (TEMOVATE) 0.05 % Apply 1 application topically 2 (two) times daily.  . Continuous Blood Gluc Sensor (FREESTYLE LIBRE SENSOR SYSTEM) MISC 1 Device by Does not apply route daily. (Patient not taking: Reported on 07/30/2020)  . glipiZIDE (GLUCOTROL) 5 MG tablet Take 1 tablet (5 mg total) by mouth daily with breakfast.  . hydrochlorothiazide (HYDRODIURIL) 25 MG tablet hydrochlorothiazide  25 mg tablet  . HYDROcodone-acetaminophen (NORCO/VICODIN) 5-325 MG tablet Take 1 tablet by mouth every 6 (six) hours as needed for severe pain.  Marland Kitchen ibuprofen (ADVIL) 200 MG tablet Take 200 mg by mouth every 6 (six) hours as needed for moderate pain. (Patient not taking: Reported on 07/30/2020)  . indomethacin (INDOCIN) 50 MG capsule Take 1 capsule (50 mg total) by mouth 2 (two) times daily with a meal. (Patient not taking: Reported on 07/30/2020)  . Lactobacillus (PROBIOTIC ACIDOPHILUS PO) Take 1 capsule by mouth daily. (Patient not taking: Reported on 07/30/2020)  . predniSONE (STERAPRED UNI-PAK 21 TAB) 10 MG (21) TBPK tablet Take 6 tabs by mouth daily  for 2 days, then 5 tabs for 2 days, then 4 tabs for 2 days, then 3 tabs for 2 days, 2 tabs for 2 days, then 1 tab by mouth daily for 2 days (Patient not taking: Reported on 07/30/2020)  . rosuvastatin (CRESTOR) 10 MG tablet Take 1 tablet (10 mg total) by mouth daily.  . sildenafil (VIAGRA) 100 MG tablet TAKE ONE-HALF TO ONE TABLET BY MOUTH DAILY AS NEEDED FOR ERECTILE DYSFUNCTION   No current facility-administered medications for this visit. (Other)      REVIEW OF SYSTEMS:    ALLERGIES Allergies  Allergen Reactions  . Other Hives and Itching    Peanuts, strawberries, pickles, wine, and corn    PAST MEDICAL HISTORY Past Medical History:  Diagnosis Date  . Arthritis   . Cataract   . Diabetes mellitus without complication ([place])    History reviewed. No pertinent surgical history.  FAMILY HISTORY Family History  Problem Relation Age of Onset  . Diabetes Mother   . Hypertension Mother     SOCIAL HISTORY Social History   Tobacco Use  . Smoking status: Former Smoker    Years: 4.00    Types: Cigarettes  . Smokeless tobacco: Never Used  Substance Use Topics  . Alcohol use: Yes    Alcohol/week: 3.0 standard drinks    Types: 3 Standard drinks or equivalent per week  . Drug use: Never         OPHTHALMIC EXAM: Base Eye Exam     Visual Acuity (ETDRS)      Right Left   Dist [place] 20/20 -2 20/25 -2       Tonometry (Tonopen, 10:22 AM)      Right Left   Pressure 13 12       Pupils      Dark Light Shape React APD   Right 4 4 Round Minimal None   Left 3 3 Round Minimal None       Visual Fields (Counting fingers)      Left Right    Full Full       Extraocular Movement      Right Left    Full Full       Neuro/Psych    Oriented x3: Yes   Mood/Affect: Normal       Dilation    Left eye: 1.0% Mydriacyl, 2.5% Phenylephrine @ 10:25 AM        Slit Lamp and Fundus Exam    External Exam      Right Left   External Normal Normal       Slit Lamp Exam      Right Left   Lids/Lashes Normal Normal   Conjunctiva/Sclera White and quiet White and quiet   Cornea Clear Clear   Anterior Chamber Deep and quiet Deep and quiet   Iris Round and reactive Round and reactive   Lens 2+ Nuclear sclerosis 2+ Nuclear sclerosis   Anterior Vitreous Normal Normal       Fundus Exam      Right Left   Posterior Vitreous  Posterior vitreous detachment, Central vitreous floaters   Disc  Normal   C/D Ratio  0.5   Macula  Mild clinically significant macular edema, Exudates, Microaneurysms   Vessels  NPDR severe   Periphery  Normal          IMAGING AND PROCEDURES  Imaging and Procedures for 11/20/20  OCT, Retina - OU - Both Eyes       Right Eye Quality was good. Scan locations included subfoveal. Central Foveal Thickness: 302. Progression has improved.   Left Eye Quality was good. Scan locations included subfoveal. Central Foveal Thickness: 325. Progression has improved.   Notes OD, extra foveal CSME continues to improve superior to the FAZ  OS with CSME temporal to the fovea continues also to improve currently at follow-up interval left eye of 6 weeks post Avastin and exam       Intravitreal Injection, Pharmacologic Agent - OS - Left Eye       Time Out 11/20/2020. 10:52 AM. Confirmed correct patient,  procedure, site, and patient consented.   Anesthesia Topical anesthesia was used. Anesthetic medications included Akten 3.5%.   Procedure Preparation included Ofloxacin , Tobramycin 0.3%, 10% betadine  to eyelids, 5% betadine to ocular surface. A 30 gauge needle was used.   Injection:  2.5 mg Bevacizumab (AVASTIN) 2.5mg /0.16mL SOSY   NDC: 29924-268-34, Lot: [id number]   Route: Intravitreal, Site: Left Eye  Post-op Post injection exam found visual acuity of at least counting fingers. The patient tolerated the procedure well. There were no complications. The patient received written and verbal post procedure care education. Post injection medications were not given.                 ASSESSMENT/PLAN:  Severe nonproliferative diabetic retinopathy of left eye, with macular edema, associated with type 2 diabetes mellitus (HCC) Improved CSME, at 6-week interval today.  Repeat injection today for further improvement.  Overall the severe NPDR is also stable.  Severe nonproliferative diabetic retinopathy of right eye, with macular edema, associated with type 2 diabetes mellitus (HCC) OD status post focal for extra foveal CSME superiorly, continued slow improvement maintained and demonstrable by OCT      ICD-10-CM   1. Severe nonproliferative diabetic retinopathy of left eye, with macular edema, associated with type 2 diabetes mellitus (HCC)  E11.3412 OCT, Retina - OU - Both Eyes    Intravitreal Injection, Pharmacologic Agent - OS - Left Eye    bevacizumab (AVASTIN) SOSY 2.5 mg  2. Severe nonproliferative diabetic retinopathy of right eye, with macular edema, associated with type 2 diabetes mellitus (HCC)  E11.3411     1.  OU with improving CSME    2.  OD status post focal, extra foveal CSME improving, observe  3.  OS, improving CSME temporally, repeat injection Avastin today at 6-week interval and lamination repeat in 6 weeks Ophthalmic Meds Ordered this visit:  Meds ordered this  encounter  Medications  . bevacizumab (AVASTIN) SOSY 2.5 mg       Return in about 6 weeks (around 01/01/2021) for dilate, OS, AVASTIN OCT.  There are no Patient Instructions on file for this visit.   Explained the diagnoses, plan, and follow up with the patient and they expressed understanding.  Patient expressed understanding of the importance of proper follow up care.   Alford Highland Jerame Hedding M.D. Diseases & Surgery of the Retina and Vitreous [facility] 11/20/20     Abbreviations: M myopia (nearsighted); A astigmatism; H hyperopia (farsighted); P presbyopia; Mrx spectacle prescription;  CTL contact lenses; OD right eye; OS left eye; OU both eyes  XT exotropia; ET esotropia; PEK punctate epithelial keratitis; PEE punctate epithelial erosions; DES dry eye syndrome; MGD meibomian gland dysfunction; ATs artificial tears; PFAT's preservative free artificial tears; NSC nuclear sclerotic cataract; PSC posterior subcapsular cataract; ERM epi-retinal membrane; PVD posterior vitreous detachment; RD retinal detachment; DM diabetes mellitus; DR diabetic retinopathy; NPDR non-proliferative diabetic retinopathy; PDR proliferative diabetic retinopathy; CSME clinically significant macular edema; DME diabetic macular edema; dbh dot blot hemorrhages; CWS cotton wool spot; POAG primary open angle glaucoma; C/D cup-to-disc ratio; HVF humphrey visual field; GVF goldmann visual field; OCT optical coherence tomography; IOP intraocular pressure; BRVO Branch retinal vein occlusion; CRVO central retinal vein occlusion; CRAO central retinal artery occlusion; BRAO branch retinal artery occlusion; RT retinal tear; SB scleral buckle; PPV pars plana vitrectomy; VH Vitreous hemorrhage; PRP panretinal laser photocoagulation; IVK intravitreal kenalog; VMT vitreomacular traction; MH Macular hole;  NVD neovascularization of the disc; NVE neovascularization elsewhere; AREDS age related eye disease study; ARMD age  related macular degeneration; POAG primary open angle glaucoma; EBMD epithelial/anterior basement membrane dystrophy; ACIOL anterior chamber intraocular lens; IOL intraocular lens;  PCIOL posterior chamber intraocular lens; Phaco/IOL phacoemulsification with intraocular lens placement; [place] photorefractive keratectomy; LASIK laser assisted in situ keratomileusis; HTN hypertension; DM diabetes mellitus; COPD chronic obstructive pulmonary disease

## 2021-01-01 ENCOUNTER — Ambulatory Visit (INDEPENDENT_AMBULATORY_CARE_PROVIDER_SITE_OTHER): Payer: Medicare HMO | Admitting: Ophthalmology

## 2021-01-01 ENCOUNTER — Encounter (INDEPENDENT_AMBULATORY_CARE_PROVIDER_SITE_OTHER): Payer: Self-pay | Admitting: Ophthalmology

## 2021-01-01 ENCOUNTER — Other Ambulatory Visit: Payer: Self-pay

## 2021-01-01 DIAGNOSIS — E113412 Type 2 diabetes mellitus with severe nonproliferative diabetic retinopathy with macular edema, left eye: Secondary | ICD-10-CM | POA: Diagnosis not present

## 2021-01-01 DIAGNOSIS — H2513 Age-related nuclear cataract, bilateral: Secondary | ICD-10-CM | POA: Diagnosis not present

## 2021-01-01 MED ORDER — BEVACIZUMAB 2.5 MG/0.1ML IZ SOSY
2.5000 mg | PREFILLED_SYRINGE | INTRAVITREAL | Status: AC | PRN
  Administered 2021-01-01: 2.5 mg via INTRAVITREAL

## 2021-01-01 NOTE — Assessment & Plan Note (Signed)
Presence of dark color lens changes cataract discussed with patient so that he might understand why his vision set may seem dark on dark nights and/or dark rainy days  Follow-up with Dr. Wallie Renshaw as scheduled

## 2021-01-01 NOTE — Progress Notes (Signed)
01/01/2021     CHIEF COMPLAINT Patient presents for Retina Follow Up (6 Week F/U OS, poss Avastin OS//Pt denies noticeable changes to [place] OU since last visit. Pt denies ocular pain, flashes of light, or floaters OU. //LBS: has not checked)   HISTORY OF PRESENT ILLNESS: Steven Knapp is a 75 y.o. male who presents to the clinic today for:   HPI    Retina Follow Up    Patient presents with  Diabetic Retinopathy.  In left eye.  This started 6 weeks ago.  Severity is mild.  Duration of 6 weeks.  Since onset it is stable. Additional comments: 6 Week F/U OS, poss Avastin OS  Pt denies noticeable changes to [place] OU since last visit. Pt denies ocular pain, flashes of light, or floaters OU.   LBS: has not checked       Last edited by Ileana Roup, COA on 01/01/2021 10:00 AM. (History)      Referring physician: Georgina Quint, MD [address] [place],  [place] [address]  HISTORICAL INFORMATION:   Selected notes from the MEDICAL RECORD NUMBER    Lab Results  Component Value Date   HGBA1C 6.4 (A) 07/30/2020     CURRENT MEDICATIONS: Current Outpatient Medications (Ophthalmic Drugs)  Medication Sig  . BEPREVE 1.5 % SOLN 1 drop 2 (two) times daily.  . SYSTANE ULTRA 0.4-0.3 % SOLN SMARTSIG:1 Drop(s) In Eye(s) As Needed  . timolol (TIMOPTIC) 0.5 % ophthalmic solution Place into both eyes daily.    No current facility-administered medications for this visit. (Ophthalmic Drugs)   Current Outpatient Medications (Other)  Medication Sig  . Acetaminophen (TYLENOL PO) Take by mouth as needed.  . clobetasol cream (TEMOVATE) 0.05 % Apply 1 application topically 2 (two) times daily.  . Continuous Blood Gluc Sensor (FREESTYLE LIBRE SENSOR SYSTEM) MISC 1 Device by Does not apply route daily. (Patient not taking: Reported on 07/30/2020)  . glipiZIDE (GLUCOTROL) 5 MG tablet Take 1 tablet (5 mg total) by mouth daily with breakfast.  . hydrochlorothiazide (HYDRODIURIL) 25 MG tablet  hydrochlorothiazide 25 mg tablet  . HYDROcodone-acetaminophen (NORCO/VICODIN) 5-325 MG tablet Take 1 tablet by mouth every 6 (six) hours as needed for severe pain.  Marland Kitchen ibuprofen (ADVIL) 200 MG tablet Take 200 mg by mouth every 6 (six) hours as needed for moderate pain. (Patient not taking: Reported on 07/30/2020)  . indomethacin (INDOCIN) 50 MG capsule Take 1 capsule (50 mg total) by mouth 2 (two) times daily with a meal. (Patient not taking: Reported on 07/30/2020)  . Lactobacillus (PROBIOTIC ACIDOPHILUS PO) Take 1 capsule by mouth daily. (Patient not taking: Reported on 07/30/2020)  . predniSONE (STERAPRED UNI-PAK 21 TAB) 10 MG (21) TBPK tablet Take 6 tabs by mouth daily  for 2 days, then 5 tabs for 2 days, then 4 tabs for 2 days, then 3 tabs for 2 days, 2 tabs for 2 days, then 1 tab by mouth daily for 2 days (Patient not taking: Reported on 07/30/2020)  . rosuvastatin (CRESTOR) 10 MG tablet Take 1 tablet (10 mg total) by mouth daily.  . sildenafil (VIAGRA) 100 MG tablet TAKE ONE-HALF TO ONE TABLET BY MOUTH DAILY AS NEEDED FOR ERECTILE DYSFUNCTION   No current facility-administered medications for this visit. (Other)      REVIEW OF SYSTEMS:    ALLERGIES Allergies  Allergen Reactions  . Other Hives and Itching    Peanuts, strawberries, pickles, wine, and corn    PAST MEDICAL HISTORY Past Medical History:  Diagnosis Date  . Arthritis   . Cataract   . Diabetes mellitus without complication (HCC)    History reviewed. No pertinent surgical history.  FAMILY HISTORY Family History  Problem Relation Age of Onset  . Diabetes Mother   . Hypertension Mother     SOCIAL HISTORY Social History   Tobacco Use  . Smoking status: Former Smoker    Years: 4.00    Types: Cigarettes  . Smokeless tobacco: Never Used  Substance Use Topics  . Alcohol use: Yes    Alcohol/week: 3.0 standard drinks    Types: 3 Standard drinks or equivalent per week  . Drug use: Never         OPHTHALMIC  EXAM: Base Eye Exam    Visual Acuity (ETDRS)      Right Left   Dist [place] 20/25 +2 20/25 -1       Tonometry (Tonopen, 10:00 AM)      Right Left   Pressure 09 13       Pupils      Dark Light Shape React APD   Right 3 3 Round Minimal None   Left 4 4 Round Minimal None       Visual Fields (Counting fingers)      Left Right    Full Full       Extraocular Movement      Right Left    Full Full       Neuro/Psych    Oriented x3: Yes   Mood/Affect: Normal       Dilation    Left eye: 1.0% Mydriacyl, 2.5% Phenylephrine @ 10:04 AM        Slit Lamp and Fundus Exam    External Exam      Right Left   External Normal Normal       Slit Lamp Exam      Right Left   Lids/Lashes Normal Normal   Conjunctiva/Sclera White and quiet White and quiet   Cornea Clear Clear   Anterior Chamber Deep and quiet Deep and quiet   Iris Round and reactive Round and reactive   Lens 2+ Nuclear sclerosis 2+ Nuclear sclerosis   Anterior Vitreous Normal Normal       Fundus Exam      Right Left   Posterior Vitreous  Posterior vitreous detachment, Central vitreous floaters   Disc  Normal   C/D Ratio  0.5   Macula  Mild clinically significant macular edema, Exudates, Microaneurysms   Vessels  NPDR severe   Periphery  Normal          IMAGING AND PROCEDURES  Imaging and Procedures for 01/01/21  OCT, Retina - OU - Both Eyes       Right Eye Quality was good. Scan locations included subfoveal. Central Foveal Thickness: 310. Progression has been stable. Findings include abnormal foveal contour.   Left Eye Quality was good. Scan locations included subfoveal. Central Foveal Thickness: 317. Progression has improved. Findings include abnormal foveal contour, cystoid macular edema.   Notes OD, extra foveal CSME continues to improve superior to the FAZ  OS with CSME temporal to the fovea continues also to improve currently at follow-up interval left eye of 6 weeks post Avastin and exam, repeat  injection Avastin OS today and examination again left eye in 6 weeks       Intravitreal Injection, Pharmacologic Agent - OS - Left Eye       Time Out 01/01/2021. 10:24 AM. Confirmed correct patient, procedure,  site, and patient consented.   Anesthesia Topical anesthesia was used. Anesthetic medications included [place] 3.5%.   Procedure Preparation included Ofloxacin , Tobramycin 0.3%, 10% betadine to eyelids, 5% betadine to ocular surface. A 30 gauge needle was used.   Injection:  2.5 mg Bevacizumab (AVASTIN) 2.5mg /0.60mL SOSY   NDC: 16109-604-54, Lot: [id number]   Route: Intravitreal, Site: Left Eye  Post-op Post injection exam found visual acuity of at least counting fingers. The patient tolerated the procedure well. There were no complications. The patient received written and verbal post procedure care education. Post injection medications were not given.                 ASSESSMENT/PLAN:  Severe nonproliferative diabetic retinopathy of left eye, with macular edema, associated with type 2 diabetes mellitus (HCC)  The nature of diabetic macular edema was discussed with the patient. Treatment options were outlined including medical therapy, laser & vitrectomy. The use of injectable medications reviewed, including Avastin, Lucentis, and Eylea. Periodic injections into the eye are likely to resolve diabetic macular edema (swelling in the center of vision). Initially, injections are delivered are delivered every 4-6 weeks, and the interval extended as the condition improves. On average, 8-9 injections the first year, and 5 in year 2. Improvement in the condition most often improves on medical therapy. Occasional use of focal laser is also recommended for residual macular edema (swelling). Excellent control of blood glucose and blood pressure are encouraged under the care of a primary physician or endocrinologist. Similarly, attempts to maintain serum cholesterol, low density lipoproteins,  and high-density lipoproteins in a favorable range were recommended.   OS, temporal CSME less thickening today at 6-week follow-up post Avastin will repeat injection OS today  Nuclear sclerotic cataract of both eyes Presence of dark color lens changes cataract discussed with patient so that he might understand why his vision set may seem dark on dark nights and/or dark rainy days  Follow-up with Dr. Wallie Renshaw as scheduled      ICD-10-CM   1. Severe nonproliferative diabetic retinopathy of left eye, with macular edema, associated with type 2 diabetes mellitus (HCC)  E11.3412 OCT, Retina - OU - Both Eyes    Intravitreal Injection, Pharmacologic Agent - OS - Left Eye    bevacizumab (AVASTIN) SOSY 2.5 mg  2. Nuclear sclerotic cataract of both eyes  H25.13     1.  Anatomy of the macula continue to improve OU.  2.  Region superior to the foveal avascular zone OD has less CSME today.  3.  Region temporally OS has less CSME today at 6-week interval post Avastin we will continue to monitor and treat today with intravitreal Avastin and follow-up in 6 weeks  Ophthalmic Meds Ordered this visit:  Meds ordered this encounter  Medications  . bevacizumab (AVASTIN) SOSY 2.5 mg       Return in about 6 weeks (around 02/12/2021) for dilate, AVASTIN OCT, OS.  There are no Patient Instructions on file for this visit.   Explained the diagnoses, plan, and follow up with the patient and they expressed understanding.  Patient expressed understanding of the importance of proper follow up care.   Alford Highland Shammond Arave M.D. Diseases & Surgery of the Retina and Vitreous [facility] 01/01/21     Abbreviations: M myopia (nearsighted); A astigmatism; H hyperopia (farsighted); P presbyopia; Mrx spectacle prescription;  CTL contact lenses; OD right eye; OS left eye; OU both eyes  XT exotropia; ET esotropia; PEK punctate  epithelial keratitis; PEE punctate epithelial erosions; DES dry eye  syndrome; MGD meibomian gland dysfunction; ATs artificial tears; PFAT's preservative free artificial tears; NSC nuclear sclerotic cataract; PSC posterior subcapsular cataract; ERM epi-retinal membrane; PVD posterior vitreous detachment; RD retinal detachment; DM diabetes mellitus; DR diabetic retinopathy; NPDR non-proliferative diabetic retinopathy; PDR proliferative diabetic retinopathy; CSME clinically significant macular edema; DME diabetic macular edema; dbh dot blot hemorrhages; CWS cotton wool spot; POAG primary open angle glaucoma; C/D cup-to-disc ratio; HVF humphrey visual field; GVF goldmann visual field; OCT optical coherence tomography; IOP intraocular pressure; BRVO Branch retinal vein occlusion; CRVO central retinal vein occlusion; CRAO central retinal artery occlusion; BRAO branch retinal artery occlusion; RT retinal tear; SB scleral buckle; PPV pars plana vitrectomy; VH Vitreous hemorrhage; PRP panretinal laser photocoagulation; IVK intravitreal kenalog; VMT vitreomacular traction; MH Macular hole;  NVD neovascularization of the disc; NVE neovascularization elsewhere; AREDS age related eye disease study; ARMD age related macular degeneration; POAG primary open angle glaucoma; EBMD epithelial/anterior basement membrane dystrophy; ACIOL anterior chamber intraocular lens; IOL intraocular lens; PCIOL posterior chamber intraocular lens; Phaco/IOL phacoemulsification with intraocular lens placement; PRK photorefractive keratectomy; LASIK laser assisted in situ keratomileusis; HTN hypertension; DM diabetes mellitus; COPD chronic obstructive pulmonary disease

## 2021-01-01 NOTE — Patient Instructions (Signed)
Patient instructed to contact the office promptly for new onset visual acuity declines or distortions 

## 2021-01-01 NOTE — Assessment & Plan Note (Signed)
The nature of diabetic macular edema was discussed with the patient. Treatment options were outlined including medical therapy, laser & vitrectomy. The use of injectable medications reviewed, including Avastin, Lucentis, and Eylea. Periodic injections into the eye are likely to resolve diabetic macular edema (swelling in the center of vision). Initially, injections are delivered are delivered every 4-6 weeks, and the interval extended as the condition improves. On average, 8-9 injections the first year, and 5 in year 2. Improvement in the condition most often improves on medical therapy. Occasional use of focal laser is also recommended for residual macular edema (swelling). Excellent control of blood glucose and blood pressure are encouraged under the care of a primary physician or endocrinologist. Similarly, attempts to maintain serum cholesterol, low density lipoproteins, and high-density lipoproteins in a favorable range were recommended.   OS, temporal CSME less thickening today at 6-week follow-up post Avastin will repeat injection OS today

## 2021-01-28 ENCOUNTER — Encounter: Payer: Self-pay | Admitting: Emergency Medicine

## 2021-01-28 ENCOUNTER — Ambulatory Visit (INDEPENDENT_AMBULATORY_CARE_PROVIDER_SITE_OTHER): Payer: Medicare HMO | Admitting: Emergency Medicine

## 2021-01-28 ENCOUNTER — Other Ambulatory Visit: Payer: Self-pay

## 2021-01-28 VITALS — BP 137/80 | HR 63 | Temp 98.2°F | Resp 16 | Ht 74.0 in | Wt 210.0 lb

## 2021-01-28 DIAGNOSIS — Z8739 Personal history of other diseases of the musculoskeletal system and connective tissue: Secondary | ICD-10-CM | POA: Diagnosis not present

## 2021-01-28 DIAGNOSIS — E113412 Type 2 diabetes mellitus with severe nonproliferative diabetic retinopathy with macular edema, left eye: Secondary | ICD-10-CM

## 2021-01-28 DIAGNOSIS — M25511 Pain in right shoulder: Secondary | ICD-10-CM

## 2021-01-28 DIAGNOSIS — M25512 Pain in left shoulder: Secondary | ICD-10-CM

## 2021-01-28 DIAGNOSIS — M2550 Pain in unspecified joint: Secondary | ICD-10-CM

## 2021-01-28 DIAGNOSIS — E1169 Type 2 diabetes mellitus with other specified complication: Secondary | ICD-10-CM | POA: Diagnosis not present

## 2021-01-28 DIAGNOSIS — E1165 Type 2 diabetes mellitus with hyperglycemia: Secondary | ICD-10-CM

## 2021-01-28 DIAGNOSIS — E113411 Type 2 diabetes mellitus with severe nonproliferative diabetic retinopathy with macular edema, right eye: Secondary | ICD-10-CM | POA: Diagnosis not present

## 2021-01-28 DIAGNOSIS — G8929 Other chronic pain: Secondary | ICD-10-CM

## 2021-01-28 DIAGNOSIS — L298 Other pruritus: Secondary | ICD-10-CM

## 2021-01-28 DIAGNOSIS — N1831 Chronic kidney disease, stage 3a: Secondary | ICD-10-CM

## 2021-01-28 LAB — GLUCOSE, POCT (MANUAL RESULT ENTRY): POC Glucose: 180 mg/dl — AB (ref 70–99)

## 2021-01-28 LAB — POCT GLYCOSYLATED HEMOGLOBIN (HGB A1C): Hemoglobin A1C: 7.3 % — AB (ref 4.0–5.6)

## 2021-01-28 MED ORDER — FREESTYLE LIBRE SENSOR SYSTEM MISC: 1.0000 | Freq: Every day | 0 refills | Status: DC

## 2021-01-28 MED ORDER — SILDENAFIL CITRATE 100 MG PO TABS: ORAL_TABLET | ORAL | 6 refills | Status: DC

## 2021-01-28 MED ORDER — HYDROCODONE-ACETAMINOPHEN 5-325 MG PO TABS: 1.0000 | ORAL_TABLET | Freq: Four times a day (QID) | ORAL | 0 refills | Status: DC | PRN

## 2021-01-28 MED ORDER — GLIPIZIDE 5 MG PO TABS: 5.0000 mg | ORAL_TABLET | Freq: Two times a day (BID) | ORAL | 3 refills | Status: DC

## 2021-01-28 NOTE — Addendum Note (Signed)
Addended by: Evie Lacks on: 01/28/2021 02:08 PM   Modules accepted: Orders

## 2021-01-28 NOTE — Progress Notes (Addendum)
Steven Knapp 75 y.o.   Chief Complaint  Patient presents with  . Diabetes  . Hypertension    Follow up     HISTORY OF PRESENT ILLNESS: This is a 75 y.o. male with history of diabetes and hypertension here for follow-up. Doing well.  Has no complaints. Gets occasional pain to both shoulders.  Requesting orthopedic referral. Presently taking glipizide for diabetes. Hydrochlorothiazide for hypertension on rosuvastatin for cholesterol. Lab Results  Component Value Date   HGBA1C 6.4 (A) 07/30/2020   BP Readings from Last 3 Encounters:  07/30/20 (!) 102/59  01/31/20 126/70  10/31/19 109/66    HPI   Prior to Admission medications   Medication Sig Start Date End Date Taking? Authorizing Provider  Acetaminophen (TYLENOL PO) Take by mouth as needed.    [provider]  BEPREVE 1.5 % SOLN 1 drop 2 (two) times daily. 02/29/20   [provider]  clobetasol cream (TEMOVATE) 0.05 % Apply 1 application topically 2 (two) times daily.    [provider]  Continuous Blood Gluc Sensor (FREESTYLE LIBRE SENSOR SYSTEM) MISC 1 Device by Does not apply route daily. Patient not taking: Reported on 07/30/2020 01/31/20   Georgina Quint, MD  glipiZIDE (GLUCOTROL) 5 MG tablet Take 1 tablet (5 mg total) by mouth daily with breakfast. 09/24/20 12/23/20  Georgina Quint, MD  hydrochlorothiazide (HYDRODIURIL) 25 MG tablet hydrochlorothiazide 25 mg tablet    [provider]  HYDROcodone-acetaminophen (NORCO/VICODIN) 5-325 MG tablet Take 1 tablet by mouth every 6 (six) hours as needed for severe pain. 01/31/20   Georgina Quint, MD  ibuprofen (ADVIL) 200 MG tablet Take 200 mg by mouth every 6 (six) hours as needed for moderate pain. Patient not taking: Reported on 07/30/2020    [provider]  indomethacin (INDOCIN) 50 MG capsule Take 1 capsule (50 mg total) by mouth 2 (two) times daily with a meal. Patient not taking: Reported on 07/30/2020 10/12/19    Robinson, [place] N, PA-C  Lactobacillus (PROBIOTIC ACIDOPHILUS PO) Take 1 capsule by mouth daily. Patient not taking: Reported on 07/30/2020    [provider]  predniSONE (STERAPRED UNI-PAK 21 TAB) 10 MG (21) TBPK tablet Take 6 tabs by mouth daily  for 2 days, then 5 tabs for 2 days, then 4 tabs for 2 days, then 3 tabs for 2 days, 2 tabs for 2 days, then 1 tab by mouth daily for 2 days Patient not taking: Reported on 07/30/2020 07/19/19   Linwood Dibbles, MD  rosuvastatin (CRESTOR) 10 MG tablet Take 1 tablet (10 mg total) by mouth daily. 07/30/20   Georgina Quint, MD  sildenafil (VIAGRA) 100 MG tablet TAKE ONE-HALF TO ONE TABLET BY MOUTH DAILY AS NEEDED FOR ERECTILE DYSFUNCTION 08/22/20   Georgina Quint, MD  SYSTANE ULTRA 0.4-0.3 % SOLN SMARTSIG:1 Drop(s) In Eye(s) As Needed 11/23/19   [provider]  timolol (TIMOPTIC) 0.5 % ophthalmic solution Place into both eyes daily.  12/26/19   [provider]    Allergies  Allergen Reactions  . Other Hives and Itching    Peanuts, strawberries, pickles, wine, and corn    Patient Active Problem List   Diagnosis Date Noted  . Uncontrolled type 2 diabetes mellitus with hyperglycemia (HCC) 07/30/2020  . Severe nonproliferative diabetic retinopathy of left eye, with macular edema, associated with type 2 diabetes mellitus (HCC) 02/21/2020  . Severe nonproliferative diabetic retinopathy of right eye, with macular edema, associated with type 2 diabetes mellitus (HCC) 02/21/2020  .  Nuclear sclerotic cataract of both eyes 02/21/2020  . Retinal hemorrhage of left eye 02/21/2020  . Retinal exudates and deposits 02/21/2020  . Alternating exotropia 02/21/2020  . Diabetes mellitus (HCC) 10/06/2019  . Spinal stenosis 10/06/2019  . History of gout 10/06/2019  . History of rheumatoid arthritis 10/06/2019  . 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 Gout 09/16/2018    Past Medical History:  Diagnosis Date  . Arthritis   . Cataract    . Diabetes mellitus without complication (HCC)     History reviewed. No pertinent surgical history.  Social History   Socioeconomic History  . Marital status: Single    Spouse name: Not on file  . Number of children: Not on file  . Years of education: Not on file  . Highest education level: Not on file  Occupational History  . Not on file  Tobacco Use  . Smoking status: Former Smoker    Years: 4.00    Types: Cigarettes  . Smokeless tobacco: Never Used  Substance and Sexual Activity  . Alcohol use: Yes    Alcohol/week: 3.0 standard drinks    Types: 3 Standard drinks or equivalent per week  . Drug use: Never  . Sexual activity: Not on file  Other Topics Concern  . Not on file  Social History Narrative  . Not on file   Social Determinants of Health   Financial Resource Strain: Not on file  Food Insecurity: Not on file  Transportation Needs: Not on file  Physical Activity: Not on file  Stress: Not on file  Social Connections: Not on file  Intimate Partner Violence: Not on file    Family History  Problem Relation Age of Onset  . Diabetes Mother   . Hypertension Mother      Review of Systems  Constitutional: Negative.  Negative for chills and fever.  HENT: Negative.  Negative for congestion and sore throat.   Respiratory: Negative.  Negative for cough and shortness of breath.   Cardiovascular: Negative.  Negative for chest pain and palpitations.  Gastrointestinal: Negative.  Negative for abdominal pain, diarrhea, nausea and vomiting.  Genitourinary: Negative.  Negative for dysuria and hematuria.  Musculoskeletal: Positive for joint pain (Bilateral shoulder pain).  Neurological: Negative.  Negative for dizziness and headaches.  All other systems reviewed and are negative.  Vitals:   01/28/21 1326 01/28/21 1408  BP: (!) 157/74 137/80  Pulse: 63   Resp: 16   Temp: 98.2 F (36.8 C)   SpO2: 96%      Physical Exam Vitals reviewed.  Constitutional:       Appearance: Normal appearance.  HENT:     Head: Normocephalic.  Eyes:     Extraocular Movements: Extraocular movements intact.     Pupils: Pupils are equal, round, and reactive to light.  Cardiovascular:     Rate and Rhythm: Normal rate and regular rhythm.     Pulses: Normal pulses.     Heart sounds: Normal heart sounds.  Pulmonary:     Effort: Pulmonary effort is normal.     Breath sounds: Normal breath sounds.  Musculoskeletal:     Cervical back: Normal range of motion.     Comments: Active gouty arthritis to right second MCP joint  Skin:    General: Skin is warm and dry.     Capillary Refill: Capillary refill takes less than 2 seconds.  Neurological:     General: No focal deficit present.     Mental Status: He is alert  and oriented to person, place, and time.  Psychiatric:        Mood and Affect: Mood normal.        Behavior: Behavior normal.    Results for orders placed or performed in visit on 01/28/21 (from the past 24 hour(s))  POCT glucose (manual entry)     Status: Abnormal   Collection Time: 01/28/21  1:48 PM  Result Value Ref Range   POC Glucose 180 (A) 70 - 99 mg/dl  POCT glycosylated hemoglobin (Hb A1C)     Status: Abnormal   Collection Time: 01/28/21  1:48 PM  Result Value Ref Range   Hemoglobin A1C 7.3 (A) 4.0 - 5.6 %   HbA1c POC (<> result, manual entry)     HbA1c, POC (prediabetic range)     HbA1c, POC (controlled diabetic range)       ASSESSMENT & PLAN: Uncontrolled type 2 diabetes mellitus with hyperglycemia (HCC) Uncontrolled diabetes with hemoglobin A1c at 7.3. Increase glipizide to 5 mg twice a day. Diet and nutrition discussed. Follow-up in 6 months.  Hoyle was seen today for diabetes, hypertension and medication refill.  Diagnoses and all orders for this visit:  Type 2 diabetes mellitus with other specified complication, without long-term current use of insulin (HCC) -     POCT glucose (manual entry) -     POCT glycosylated hemoglobin (Hb  A1C) -     Comprehensive metabolic panel -     Continuous Blood Gluc Sensor (FREESTYLE LIBRE SENSOR SYSTEM) MISC; 1 Device by Does not apply route daily. -     glipiZIDE (GLUCOTROL) 5 MG tablet; Take 1 tablet (5 mg total) by mouth 2 (two) times daily before a meal.  Severe nonproliferative diabetic retinopathy of left eye, with macular edema, associated with type 2 diabetes mellitus (HCC)  Severe nonproliferative diabetic retinopathy of right eye, with macular edema, associated with type 2 diabetes mellitus (HCC)  History of spinal stenosis  Stage 3a chronic kidney disease (HCC)  History of gout  Chronic pruritic rash in adult  Arthralgia of multiple joints  Uncontrolled type 2 diabetes mellitus with hyperglycemia (HCC)  Other orders -     HYDROcodone-acetaminophen (NORCO/VICODIN) 5-325 MG tablet; Take 1 tablet by mouth every 6 (six) hours as needed for severe pain. -     sildenafil (VIAGRA) 100 MG tablet; TAKE ONE-HALF TO ONE TABLET BY MOUTH DAILY AS NEEDED FOR ERECTILE DYSFUNCTION    Patient Instructions   Increase glipizide to 5 mg twice a day.  Diabetes Mellitus and Nutrition, Adult When you have diabetes, or diabetes mellitus, it is very important to have healthy eating habits because your blood sugar (glucose) levels are greatly affected by what you eat and drink. Eating healthy foods in the right amounts, at about the same times every day, can help you:  Control your blood glucose.  Lower your risk of heart disease.  Improve your blood pressure.  Reach or maintain a healthy weight. What can affect my meal plan? Every person with diabetes is different, and each person has different needs for a meal plan. Your health care provider may recommend that you work with a dietitian to make a meal plan that is best for you. Your meal plan may vary depending on factors such as:  The calories you need.  The medicines you take.  Your weight.  Your blood glucose, blood  pressure, and cholesterol levels.  Your activity level.  Other health conditions you have, such as heart or kidney  disease. How do carbohydrates affect me? Carbohydrates, also called carbs, affect your blood glucose level more than any other type of food. Eating carbs naturally raises the amount of glucose in your blood. Carb counting is a method for keeping track of how many carbs you eat. Counting carbs is important to keep your blood glucose at a healthy level, especially if you use insulin or take certain oral diabetes medicines. It is important to know how many carbs you can safely have in each meal. This is different for every person. Your dietitian can help you calculate how many carbs you should have at each meal and for each snack. How does alcohol affect me? Alcohol can cause a sudden decrease in blood glucose (hypoglycemia), especially if you use insulin or take certain oral diabetes medicines. Hypoglycemia can be a life-threatening condition. Symptoms of hypoglycemia, such as sleepiness, dizziness, and confusion, are similar to symptoms of having too much alcohol.  Do not drink alcohol if: ? Your health care provider tells you not to drink. ? You are pregnant, may be pregnant, or are planning to become pregnant.  If you drink alcohol: ? Do not drink on an empty stomach. ? Limit how much you use to:  0-1 drink a day for women.  0-2 drinks a day for men. ? Be aware of how much alcohol is in your drink. In the [place] one drink equals one 12 oz bottle of beer (355 mL), one 5 oz glass of wine (148 mL), or one 1 oz glass of hard liquor (44 mL). ? Keep yourself hydrated with water, diet soda, or unsweetened iced tea.  Keep in mind that regular soda, juice, and other mixers may contain a lot of sugar and must be counted as carbs. What are tips for following this plan? Reading food labels  Start by checking the serving size on the "Nutrition Facts" label of packaged foods and drinks.  The amount of calories, carbs, fats, and other nutrients listed on the label is based on one serving of the item. Many items contain more than one serving per package.  Check the total grams (g) of carbs in one serving. You can calculate the number of servings of carbs in one serving by dividing the total carbs by 15. For example, if a food has 30 g of total carbs per serving, it would be equal to 2 servings of carbs.  Check the number of grams (g) of saturated fats and trans fats in one serving. Choose foods that have a low amount or none of these fats.  Check the number of milligrams (mg) of salt (sodium) in one serving. Most people should limit total sodium intake to less than 2,300 mg per day.  Always check the nutrition information of foods labeled as "low-fat" or "nonfat." These foods may be higher in added sugar or refined carbs and should be avoided.  Talk to your dietitian to identify your daily goals for nutrients listed on the label. Shopping  Avoid buying canned, pre-made, or processed foods. These foods tend to be high in fat, sodium, and added sugar.  Shop around the outside edge of the grocery store. This is where you will most often find fresh fruits and vegetables, bulk grains, fresh meats, and fresh dairy. Cooking  Use low-heat cooking methods, such as baking, instead of high-heat cooking methods like deep frying.  Cook using healthy oils, such as olive, canola, or sunflower oil.  Avoid cooking with butter, cream, or high-fat meats. Meal  planning  Eat meals and snacks regularly, preferably at the same times every day. Avoid going long periods of time without eating.  Eat foods that are high in fiber, such as fresh fruits, vegetables, beans, and whole grains. Talk with your dietitian about how many servings of carbs you can eat at each meal.  Eat 4-6 oz (112-168 g) of lean protein each day, such as lean meat, chicken, fish, eggs, or tofu. One ounce (oz) of lean protein  is equal to: ? 1 oz (28 g) of meat, chicken, or fish. ? 1 egg. ?  cup (62 g) of tofu.  Eat some foods each day that contain healthy fats, such as avocado, nuts, seeds, and fish.   What foods should I eat? Fruits Berries. Apples. Oranges. Peaches. Apricots. Plums. Grapes. Mango. Papaya. Pomegranate. Kiwi. Cherries. Vegetables Lettuce. Spinach. Leafy greens, including kale, chard, collard greens, and mustard greens. Beets. Cauliflower. Cabbage. Broccoli. Carrots. Green beans. Tomatoes. Peppers. Onions. Cucumbers. Brussels sprouts. Grains Whole grains, such as whole-wheat or whole-grain bread, crackers, tortillas, cereal, and pasta. Unsweetened oatmeal. Quinoa. Brown or wild rice. Meats and other proteins Seafood. Poultry without skin. Lean cuts of poultry and beef. Tofu. Nuts. Seeds. Dairy Low-fat or fat-free dairy products such as milk, yogurt, and cheese. The items listed above may not be a complete list of foods and beverages you can eat. Contact a dietitian for more information. What foods should I avoid? Fruits Fruits canned with syrup. Vegetables Canned vegetables. Frozen vegetables with butter or cream sauce. Grains Refined white flour and flour products such as bread, pasta, snack foods, and cereals. Avoid all processed foods. Meats and other proteins Fatty cuts of meat. Poultry with skin. Breaded or fried meats. Processed meat. Avoid saturated fats. Dairy Full-fat yogurt, cheese, or milk. Beverages Sweetened drinks, such as soda or iced tea. The items listed above may not be a complete list of foods and beverages you should avoid. Contact a dietitian for more information. Questions to ask a health care provider  Do I need to meet with a diabetes educator?  Do I need to meet with a dietitian?  What number can I call if I have questions?  When are the best times to check my blood glucose? Where to find more information:  American Diabetes Association:  diabetes.org  Academy of Nutrition and Dietetics: www.eatright.[employer] of Diabetes and Digestive and Kidney Diseases: CarFlippers.tn  Association of Diabetes Care and Education Specialists: www.diabeteseducator.org Summary  It is important to have healthy eating habits because your blood sugar (glucose) levels are greatly affected by what you eat and drink.  A healthy meal plan will help you control your blood glucose and maintain a healthy lifestyle.  Your health care provider may recommend that you work with a dietitian to make a meal plan that is best for you.  Keep in mind that carbohydrates (carbs) and alcohol have immediate effects on your blood glucose levels. It is important to count carbs and to use alcohol carefully. This information is not intended to replace advice given to you by your health care provider. Make sure you discuss any questions you have with your health care provider. Document Revised: 10/11/2019 Document Reviewed: 10/11/2019 Elsevier Patient Education  2021 [employer].     If you have lab work done today you will be contacted with your lab results within the next 2 weeks.  If you have not heard from [place] then please contact us. The fastest way to get your  results is to register for My Chart.   IF you received an x-ray today, you will receive an invoice from [facility]. Please contact [facility] at [phone] with questions or concerns regarding your invoice.   IF you received labwork today, you will receive an invoice from [facility]. Please contact [facility] at [phone] with questions or concerns regarding your invoice.   Our billing staff will not be able to assist you with questions regarding bills from these companies.  You will be contacted with the lab results as soon as they are available. The fastest way to get your results is to activate your My Chart account. Instructions are located on the last page of  this paperwork. If you have not heard from [place] regarding the results in 2 weeks, please contact this office.         Edwina Barth, MD [facility]

## 2021-01-28 NOTE — Assessment & Plan Note (Signed)
Uncontrolled diabetes with hemoglobin A1c at 7.3. Increase glipizide to 5 mg twice a day. Diet and nutrition discussed. Follow-up in 6 months.

## 2021-01-28 NOTE — Patient Instructions (Addendum)
Increase glipizide to 5 mg twice a day.  Diabetes Mellitus and Nutrition, Adult When you have diabetes, or diabetes mellitus, it is very important to have healthy eating habits because your blood sugar (glucose) levels are greatly affected by what you eat and drink. Eating healthy foods in the right amounts, at about the same times every day, can help you:  Control your blood glucose.  Lower your risk of heart disease.  Improve your blood pressure.  Reach or maintain a healthy weight. What can affect my meal plan? Every person with diabetes is different, and each person has different needs for a meal plan. Your health care provider may recommend that you work with a dietitian to make a meal plan that is best for you. Your meal plan may vary depending on factors such as:  The calories you need.  The medicines you take.  Your weight.  Your blood glucose, blood pressure, and cholesterol levels.  Your activity level.  Other health conditions you have, such as heart or kidney disease. How do carbohydrates affect me? Carbohydrates, also called carbs, affect your blood glucose level more than any other type of food. Eating carbs naturally raises the amount of glucose in your blood. Carb counting is a method for keeping track of how many carbs you eat. Counting carbs is important to keep your blood glucose at a healthy level, especially if you use insulin or take certain oral diabetes medicines. It is important to know how many carbs you can safely have in each meal. This is different for every person. Your dietitian can help you calculate how many carbs you should have at each meal and for each snack. How does alcohol affect me? Alcohol can cause a sudden decrease in blood glucose (hypoglycemia), especially if you use insulin or take certain oral diabetes medicines. Hypoglycemia can be a life-threatening condition. Symptoms of hypoglycemia, such as sleepiness, dizziness, and confusion, are  similar to symptoms of having too much alcohol.  Do not drink alcohol if: ? Your health care provider tells you not to drink. ? You are pregnant, may be pregnant, or are planning to become pregnant.  If you drink alcohol: ? Do not drink on an empty stomach. ? Limit how much you use to:  0-1 drink a day for women.  0-2 drinks a day for men. ? Be aware of how much alcohol is in your drink. In the [place] one drink equals one 12 oz bottle of beer (355 mL), one 5 oz glass of wine (148 mL), or one 1 oz glass of hard liquor (44 mL). ? Keep yourself hydrated with water, diet soda, or unsweetened iced tea.  Keep in mind that regular soda, juice, and other mixers may contain a lot of sugar and must be counted as carbs. What are tips for following this plan? Reading food labels  Start by checking the serving size on the "Nutrition Facts" label of packaged foods and drinks. The amount of calories, carbs, fats, and other nutrients listed on the label is based on one serving of the item. Many items contain more than one serving per package.  Check the total grams (g) of carbs in one serving. You can calculate the number of servings of carbs in one serving by dividing the total carbs by 15. For example, if a food has 30 g of total carbs per serving, it would be equal to 2 servings of carbs.  Check the number of grams (g) of saturated fats and  trans fats in one serving. Choose foods that have a low amount or none of these fats.  Check the number of milligrams (mg) of salt (sodium) in one serving. Most people should limit total sodium intake to less than 2,300 mg per day.  Always check the nutrition information of foods labeled as "low-fat" or "nonfat." These foods may be higher in added sugar or refined carbs and should be avoided.  Talk to your dietitian to identify your daily goals for nutrients listed on the label. Shopping  Avoid buying canned, pre-made, or processed foods. These foods tend to  be high in fat, sodium, and added sugar.  Shop around the outside edge of the grocery store. This is where you will most often find fresh fruits and vegetables, bulk grains, fresh meats, and fresh dairy. Cooking  Use low-heat cooking methods, such as baking, instead of high-heat cooking methods like deep frying.  Cook using healthy oils, such as olive, canola, or sunflower oil.  Avoid cooking with butter, cream, or high-fat meats. Meal planning  Eat meals and snacks regularly, preferably at the same times every day. Avoid going long periods of time without eating.  Eat foods that are high in fiber, such as fresh fruits, vegetables, beans, and whole grains. Talk with your dietitian about how many servings of carbs you can eat at each meal.  Eat 4-6 oz (112-168 g) of lean protein each day, such as lean meat, chicken, fish, eggs, or tofu. One ounce (oz) of lean protein is equal to: ? 1 oz (28 g) of meat, chicken, or fish. ? 1 egg. ?  cup (62 g) of tofu.  Eat some foods each day that contain healthy fats, such as avocado, nuts, seeds, and fish.   What foods should I eat? Fruits Berries. Apples. Oranges. Peaches. Apricots. Plums. Grapes. Mango. Papaya. Pomegranate. Kiwi. Cherries. Vegetables Lettuce. Spinach. Leafy greens, including kale, chard, collard greens, and mustard greens. Beets. Cauliflower. Cabbage. Broccoli. Carrots. Green beans. Tomatoes. Peppers. Onions. Cucumbers. Brussels sprouts. Grains Whole grains, such as whole-wheat or whole-grain bread, crackers, tortillas, cereal, and pasta. Unsweetened oatmeal. Quinoa. Brown or wild rice. Meats and other proteins Seafood. Poultry without skin. Lean cuts of poultry and beef. Tofu. Nuts. Seeds. Dairy Low-fat or fat-free dairy products such as milk, yogurt, and cheese. The items listed above may not be a complete list of foods and beverages you can eat. Contact a dietitian for more information. What foods should I  avoid? Fruits Fruits canned with syrup. Vegetables Canned vegetables. Frozen vegetables with butter or cream sauce. Grains Refined white flour and flour products such as bread, pasta, snack foods, and cereals. Avoid all processed foods. Meats and other proteins Fatty cuts of meat. Poultry with skin. Breaded or fried meats. Processed meat. Avoid saturated fats. Dairy Full-fat yogurt, cheese, or milk. Beverages Sweetened drinks, such as soda or iced tea. The items listed above may not be a complete list of foods and beverages you should avoid. Contact a dietitian for more information. Questions to ask a health care provider  Do I need to meet with a diabetes educator?  Do I need to meet with a dietitian?  What number can I call if I have questions?  When are the best times to check my blood glucose? Where to find more information:  American Diabetes Association: diabetes.org  Academy of Nutrition and Dietetics: www.eatright.[employer] of Diabetes and Digestive and Kidney Diseases: CarFlippers.tn  Association of Diabetes Care and Education Specialists: www.diabeteseducator.org  Summary  It is important to have healthy eating habits because your blood sugar (glucose) levels are greatly affected by what you eat and drink.  A healthy meal plan will help you control your blood glucose and maintain a healthy lifestyle.  Your health care provider may recommend that you work with a dietitian to make a meal plan that is best for you.  Keep in mind that carbohydrates (carbs) and alcohol have immediate effects on your blood glucose levels. It is important to count carbs and to use alcohol carefully. This information is not intended to replace advice given to you by your health care provider. Make sure you discuss any questions you have with your health care provider. Document Revised: 10/11/2019 Document Reviewed: 10/11/2019 Elsevier Patient Education  2021 [employer].     If you have lab work done today you will be contacted with your lab results within the next 2 weeks.  If you have not heard from [place] then please contact us. The fastest way to get your results is to register for My Chart.   IF you received an x-ray today, you will receive an invoice from [facility]. Please contact [facility] at [phone] with questions or concerns regarding your invoice.   IF you received labwork today, you will receive an invoice from [facility]. Please contact [facility] at [phone] with questions or concerns regarding your invoice.   Our billing staff will not be able to assist you with questions regarding bills from these companies.  You will be contacted with the lab results as soon as they are available. The fastest way to get your results is to activate your My Chart account. Instructions are located on the last page of this paperwork. If you have not heard from [place] regarding the results in 2 weeks, please contact this office.

## 2021-01-29 LAB — COMPREHENSIVE METABOLIC PANEL
ALT: 19 IU/L (ref 0–44)
AST: 18 IU/L (ref 0–40)
Albumin/Globulin Ratio: 1.3 (ref 1.2–2.2)
Albumin: 4 g/dL (ref 3.7–4.7)
Alkaline Phosphatase: 65 IU/L (ref 44–121)
BUN/Creatinine Ratio: 15 (ref 10–24)
BUN: 24 mg/dL (ref 8–27)
Bilirubin Total: 0.3 mg/dL (ref 0.0–1.2)
CO2: 18 mmol/L — ABNORMAL LOW (ref 20–29)
Calcium: 9.8 mg/dL (ref 8.6–10.2)
Chloride: 104 mmol/L (ref 96–106)
Creatinine, Ser: 1.6 mg/dL — ABNORMAL HIGH (ref 0.76–1.27)
Globulin, Total: 3.1 g/dL (ref 1.5–4.5)
Glucose: 171 mg/dL — ABNORMAL HIGH (ref 65–99)
Potassium: 4.2 mmol/L (ref 3.5–5.2)
Sodium: 138 mmol/L (ref 134–144)
Total Protein: 7.1 g/dL (ref 6.0–8.5)
eGFR: 44 mL/min/{1.73_m2} — ABNORMAL LOW (ref >59)

## 2021-02-05 ENCOUNTER — Telehealth: Payer: Self-pay | Admitting: Emergency Medicine

## 2021-02-05 NOTE — Telephone Encounter (Signed)
1. Liborio Nixon, pharmacist from [facility] pharmacy calling to clarify which Josephine Igo the dr wants to prescribe  There is a Therapist, art 14 day system Or  Libre 2 system  2. Both systems come with a reader and a sensor.  Does the pt need the reader and sensor Or just the sensor   cb  [phone] Or you can send electronic updated Rx

## 2021-02-07 ENCOUNTER — Other Ambulatory Visit: Payer: Self-pay

## 2021-02-07 MED ORDER — BLOOD GLUCOSE METER KIT: PACK | 0 refills | Status: AC

## 2021-02-07 NOTE — Telephone Encounter (Signed)
Valle not covered by insurance. Sent new kit with directions dispense per insurance

## 2021-02-12 ENCOUNTER — Encounter: Payer: Self-pay | Admitting: Orthopaedic Surgery

## 2021-02-12 ENCOUNTER — Ambulatory Visit: Payer: Self-pay

## 2021-02-12 ENCOUNTER — Ambulatory Visit (INDEPENDENT_AMBULATORY_CARE_PROVIDER_SITE_OTHER): Payer: Medicare HMO | Admitting: Ophthalmology

## 2021-02-12 ENCOUNTER — Ambulatory Visit: Payer: Medicare HMO | Admitting: Orthopaedic Surgery

## 2021-02-12 ENCOUNTER — Ambulatory Visit (INDEPENDENT_AMBULATORY_CARE_PROVIDER_SITE_OTHER): Payer: Medicare HMO

## 2021-02-12 ENCOUNTER — Other Ambulatory Visit: Payer: Self-pay

## 2021-02-12 ENCOUNTER — Encounter (INDEPENDENT_AMBULATORY_CARE_PROVIDER_SITE_OTHER): Payer: Self-pay | Admitting: Ophthalmology

## 2021-02-12 DIAGNOSIS — M25531 Pain in right wrist: Secondary | ICD-10-CM

## 2021-02-12 DIAGNOSIS — E113412 Type 2 diabetes mellitus with severe nonproliferative diabetic retinopathy with macular edema, left eye: Secondary | ICD-10-CM

## 2021-02-12 DIAGNOSIS — M25532 Pain in left wrist: Secondary | ICD-10-CM | POA: Diagnosis not present

## 2021-02-12 DIAGNOSIS — E113411 Type 2 diabetes mellitus with severe nonproliferative diabetic retinopathy with macular edema, right eye: Secondary | ICD-10-CM | POA: Diagnosis not present

## 2021-02-12 MED ORDER — BEVACIZUMAB 2.5 MG/0.1ML IZ SOSY
2.5000 mg | PREFILLED_SYRINGE | INTRAVITREAL | Status: AC | PRN
  Administered 2021-02-12: 2.5 mg via INTRAVITREAL

## 2021-02-12 MED ORDER — PREDNISONE 10 MG (21) PO TBPK: ORAL_TABLET | ORAL | 0 refills | Status: DC

## 2021-02-12 NOTE — Progress Notes (Signed)
Office Visit Note   Patient: Steven Knapp           Date of Birth: [birth year]           MRN: [id number] Visit Date: 02/12/2021              Requested by: Steven Quint, MD [address] [place],  [place] [address] PCP: Steven Quint, MD   Assessment & Plan: Visit Diagnoses:  1. Pain in left wrist   2. Pain in right wrist     Plan: My impression is gout versus autoimmune process.  Arthritis panel obtained today.  In the meantime we will send him a prednisone Dosepak to take.  We will be in touch with the patient regarding the lab results.  He will likely need referral to a rheumatologist for medical management of these conditions.  Follow-Up Instructions: Return if symptoms worsen or fail to improve.   Orders:  Orders Placed This Encounter  Procedures  . XR Hand Complete Left  . XR Hand Complete Right  . XR Wrist Complete Left  . XR Wrist Complete Right  . Uric acid  . Sedimentation rate  . ANA  . Rheumatoid Factor   Meds ordered this encounter  Medications  . predniSONE (STERAPRED UNI-PAK 21 TAB) 10 MG (21) TBPK tablet    Sig: Take as directed    Dispense:  21 tablet    Refill:  0      Procedures: No procedures performed   Clinical Data: No additional findings.   Subjective: Chief Complaint  Patient presents with  . Right Wrist - Pain  . Left Wrist - Pain    Steven Knapp is a very pleasant 75 year old gentleman referral from PCP Steven Knapp for evaluation of bilateral hand and wrist swelling and pain.  Denies any injuries.  He has had the symptoms off and on for about 3 to 4 years.  He takes hydrocodone for chronic pain.  He does endorse some numbness and tingling both of his hands.  His right hand is most symptomatic in terms of the pain and swelling.  He does have a strong history of gout and questionable rheumatoid arthritis.  He has taken colchicine in the past but he does not take any maintenance medications such as allopurinol for the  gout.   Review of Systems  Constitutional: Negative.   All other systems reviewed and are negative.    Objective: Vital Signs: There were no vitals taken for this visit.  Physical Exam Vitals and nursing note reviewed.  Constitutional:      Appearance: He is well-developed.  HENT:     Head: Normocephalic and atraumatic.  Eyes:     Pupils: Pupils are equal, round, and reactive to light.  Pulmonary:     Effort: Pulmonary effort is normal.  Abdominal:     Palpations: Abdomen is soft.  Musculoskeletal:        General: Normal range of motion.     Cervical back: Neck supple.  Skin:    General: Skin is warm.  Neurological:     Mental Status: He is alert and oriented to person, place, and time.  Psychiatric:        Behavior: Behavior normal.        Thought Content: Thought content normal.        Judgment: Judgment normal.     Ortho Exam Right hand shows moderate to severe swelling diffusely.  He has tenderness throughout the hand and digits and  wrist.  There is no signs of infection.  No neurovascular compromise.  Left hand shows mild swelling with less tenderness of the hand and digits and wrist.  No neurovascular compromise. Specialty Comments:  No specialty comments available.  Imaging: Intravitreal Injection, Pharmacologic Agent - OS - Left Eye  Result Date: 02/12/2021 Time Out 02/12/2021. 10:03 AM. Confirmed correct patient, procedure, site, and patient consented. Anesthesia Topical anesthesia was used. Anesthetic medications included Akten 3.5%. Procedure Preparation included Ofloxacin , Tobramycin 0.3%, 10% betadine to eyelids, 5% betadine to ocular surface. A 30 gauge needle was used. Injection: 2.5 mg Bevacizumab (AVASTIN) 2.5mg /0.93mL SOSY   NDC: 54562-563-89, Lot: [id number]   Route: Intravitreal, Site: Left Eye Post-op Post injection exam found visual acuity of at least counting fingers. The patient tolerated the procedure well. There were no complications. The patient  received written and verbal post procedure care education. Post injection medications were not given.   OCT, Retina - OU - Both Eyes  Result Date: 02/12/2021 Right Eye Quality was good. Scan locations included subfoveal. Central Foveal Thickness: 303. Progression has been stable. Findings include abnormal foveal contour. Left Eye Quality was good. Scan locations included subfoveal. Central Foveal Thickness: 314. Progression has been stable. Findings include abnormal foveal contour. Notes Will monitor CSME focal superior to the fovea OD OS with prior center involved CSME, slightly improved again at 6-week interval  XR Wrist Complete Left  Result Date: 02/12/2021 Mild arthritis with questionable erosive changes of the carpus.  XR Wrist Complete Right  Result Date: 02/12/2021 Mild arthritis with questionable erosive changes of the carpus.  XR Hand Complete Left  Result Date: 02/12/2021 Mild arthritis with questionable erosive changes of the carpus.  XR Hand Complete Right  Result Date: 02/12/2021 Mild arthritis with questionable erosive changes of the carpus.    PMFS History: Patient Active Problem List   Diagnosis Date Noted  . Uncontrolled type 2 diabetes mellitus with hyperglycemia (HCC) 07/30/2020  . Severe nonproliferative diabetic retinopathy of left eye, with macular edema, associated with type 2 diabetes mellitus (HCC) 02/21/2020  . Severe nonproliferative diabetic retinopathy of right eye, with macular edema, associated with type 2 diabetes mellitus (HCC) 02/21/2020  . Nuclear sclerotic cataract of both eyes 02/21/2020  . Retinal hemorrhage of left eye 02/21/2020  . Retinal exudates and deposits 02/21/2020  . Alternating exotropia 02/21/2020  . Diabetes mellitus (HCC) 10/06/2019  . Spinal stenosis 10/06/2019  . History of gout 10/06/2019  . History of rheumatoid arthritis 10/06/2019  . 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 Gout 09/16/2018   Past Medical History:   Diagnosis Date  . Arthritis   . Cataract   . Diabetes mellitus without complication (HCC)     Family History  Problem Relation Age of Onset  . Diabetes Mother   . Hypertension Mother     History reviewed. No pertinent surgical history. Social History   Occupational History  . Not on file  Tobacco Use  . Smoking status: Former Smoker    Years: 4.00    Types: Cigarettes  . Smokeless tobacco: Never Used  Substance and Sexual Activity  . Alcohol use: Yes    Alcohol/week: 3.0 standard drinks    Types: 3 Standard drinks or equivalent per week  . Drug use: Never  . Sexual activity: Not on file

## 2021-02-12 NOTE — Progress Notes (Signed)
02/12/2021     CHIEF COMPLAINT Patient presents for Retina Follow Up (6 Wk F/U OS, poss Avastin OS//Pt denies noticeable changes to [place] OU since last visit. Pt denies ocular pain, flashes of light, or floaters OU. //A1c: 7.3, 03/22/LBS: has not checked, needs new meter)   HISTORY OF PRESENT ILLNESS: Steven Knapp is a 75 y.o. male who presents to the clinic today for:   HPI    Retina Follow Up    Patient presents with  Diabetic Retinopathy.  In left eye.  This started 6 weeks ago.  Severity is mild.  Duration of 6 weeks.  Since onset it is stable. Additional comments: 6 Wk F/U OS, poss Avastin OS  Pt denies noticeable changes to [place] OU since last visit. Pt denies ocular pain, flashes of light, or floaters OU.   A1c: 7.3, 03/22 LBS: has not checked, needs new meter       Last edited by [name], [place] on 02/12/2021  9:12 AM. (History)      Referring physician: Horald Pollen, MD [place],  [place] [address]  HISTORICAL INFORMATION:   Selected notes from the MEDICAL RECORD NUMBER    Lab Results  Component Value Date   HGBA1C 7.3 (A) 01/28/2021     CURRENT MEDICATIONS: Current Outpatient Medications (Ophthalmic Drugs)  Medication Sig  . timolol (TIMOPTIC) 0.5 % ophthalmic solution Place into both eyes daily.   Marland Kitchen BEPREVE 1.5 % SOLN 1 drop 2 (two) times daily.  . SYSTANE ULTRA 0.4-0.3 % SOLN SMARTSIG:1 Drop(s) In Eye(s) As Needed   No current facility-administered medications for this visit. (Ophthalmic Drugs)   Current Outpatient Medications (Other)  Medication Sig  . blood glucose meter kit and supplies Dispense based on patient and insurance preference. Use up to four times daily as directed. (FOR ICD-10 E10.9, E11.9).  . clobetasol cream (TEMOVATE) 7.16 % Apply 1 application topically 2 (two) times daily.  . Continuous Blood Gluc Sensor (FREESTYLE LIBRE SENSOR SYSTEM) MISC 1 Device by Does not apply route daily.  Marland Kitchen glipiZIDE (GLUCOTROL) 5 MG tablet  Take 1 tablet (5 mg total) by mouth 2 (two) times daily before a meal.  . hydrochlorothiazide (HYDRODIURIL) 25 MG tablet hydrochlorothiazide 25 mg tablet  . HYDROcodone-acetaminophen (NORCO/VICODIN) 5-325 MG tablet Take 1 tablet by mouth every 6 (six) hours as needed for severe pain.  Marland Kitchen ibuprofen (ADVIL) 200 MG tablet Take 200 mg by mouth every 6 (six) hours as needed for moderate pain. (Patient not taking: Reported on 01/28/2021)  . indomethacin (INDOCIN) 50 MG capsule Take 1 capsule (50 mg total) by mouth 2 (two) times daily with a meal. (Patient not taking: Reported on 01/28/2021)  . Lactobacillus (PROBIOTIC ACIDOPHILUS PO) Take 1 capsule by mouth daily.  . predniSONE (STERAPRED UNI-PAK 21 TAB) 10 MG (21) TBPK tablet Take 6 tabs by mouth daily  for 2 days, then 5 tabs for 2 days, then 4 tabs for 2 days, then 3 tabs for 2 days, 2 tabs for 2 days, then 1 tab by mouth daily for 2 days (Patient not taking: Reported on 01/28/2021)  . rosuvastatin (CRESTOR) 10 MG tablet Take 1 tablet (10 mg total) by mouth daily.  . sildenafil (VIAGRA) 100 MG tablet TAKE ONE-HALF TO ONE TABLET BY MOUTH DAILY AS NEEDED FOR ERECTILE DYSFUNCTION   No current facility-administered medications for this visit. (Other)      REVIEW OF SYSTEMS:    ALLERGIES Allergies  Allergen Reactions  . Other Hives  and Itching    Peanuts, strawberries, pickles, wine, and corn    PAST MEDICAL HISTORY Past Medical History:  Diagnosis Date  . Arthritis   . Cataract   . Diabetes mellitus without complication ([place])    History reviewed. No pertinent surgical history.  FAMILY HISTORY Family History  Problem Relation Age of Onset  . Diabetes Mother   . Hypertension Mother     SOCIAL HISTORY Social History   Tobacco Use  . Smoking status: Former Smoker    Years: 4.00    Types: Cigarettes  . Smokeless tobacco: Never Used  Substance Use Topics  . Alcohol use: Yes    Alcohol/week: 3.0 standard drinks    Types: 3 Standard  drinks or equivalent per week  . Drug use: Never         OPHTHALMIC EXAM: Base Eye Exam    Visual Acuity (ETDRS)      Right Left   Dist [place] 20/20 -2 20/25 -1       Tonometry (Tonopen, 9:12 AM)      Right Left   Pressure 14 16       Pupils      Pupils Dark Light Shape React APD   Right PERRL 3 3 Round Minimal None   Left PERRL 4 4 Round Minimal None       Visual Fields (Counting fingers)      Left Right    Full Full       Extraocular Movement   Alt XT OU       Neuro/Psych    Oriented x3: Yes   Mood/Affect: Normal       Dilation    Left eye: 1.0% Mydriacyl, 2.5% Phenylephrine @ 9:16 AM        Slit Lamp and Fundus Exam    External Exam      Right Left   External Normal Normal       Slit Lamp Exam      Right Left   Lids/Lashes Normal Normal   Conjunctiva/Sclera White and quiet White and quiet   Cornea Clear Clear   Anterior Chamber Deep and quiet Deep and quiet   Iris Round and reactive Round and reactive   Lens 2+ Nuclear sclerosis 2+ Nuclear sclerosis   Anterior Vitreous Normal Normal       Fundus Exam      Right Left   Posterior Vitreous  Posterior vitreous detachment, Central vitreous floaters   Disc  Normal   C/D Ratio  0.5   Macula  Mild clinically significant macular edema, Exudates, Microaneurysms   Vessels  NPDR severe   Periphery  Normal          IMAGING AND PROCEDURES  Imaging and Procedures for 02/12/21  OCT, Retina - OU - Both Eyes       Right Eye Quality was good. Scan locations included subfoveal. Central Foveal Thickness: 303. Progression has been stable. Findings include abnormal foveal contour.   Left Eye Quality was good. Scan locations included subfoveal. Central Foveal Thickness: 314. Progression has been stable. Findings include abnormal foveal contour.   Notes Will monitor CSME focal superior to the fovea OD  OS with prior center involved CSME, slightly improved again at 6-week interval       Intravitreal  Injection, Pharmacologic Agent - OS - Left Eye       Time Out 02/12/2021. 10:03 AM. Confirmed correct patient, procedure, site, and patient consented.   Anesthesia Topical anesthesia was used. Anesthetic  medications included [place] 3.5%.   Procedure Preparation included Ofloxacin , Tobramycin 0.3%, 10% betadine to eyelids, 5% betadine to ocular surface. A 30 gauge needle was used.   Injection:  2.5 mg Bevacizumab (AVASTIN) 2.57m/0.1mL SOSY   NDC:: 10626-948-54 Lot:: [id number]  Route: Intravitreal, Site: Left Eye  Post-op Post injection exam found visual acuity of at least counting fingers. The patient tolerated the procedure well. There were no complications. The patient received written and verbal post procedure care education. Post injection medications were not given.                 ASSESSMENT/PLAN:  Severe nonproliferative diabetic retinopathy of left eye, with macular edema, associated with type 2 diabetes mellitus ([place]  The nature of diabetic macular edema was discussed with the patient. Treatment options were outlined including medical therapy, laser & vitrectomy. The use of injectable medications reviewed, including Avastin, Lucentis, and Eylea. Periodic injections into the eye are likely to resolve diabetic macular edema (swelling in the center of vision). Initially, injections are delivered are delivered every 4-6 weeks, and the interval extended as the condition improves. On average, 8-9 injections the first year, and 5 in year 2. Improvement in the condition most often improves on medical therapy. Occasional use of focal laser is also recommended for residual macular edema (swelling). Excellent control of blood glucose and blood pressure are encouraged under the care of a primary physician or endocrinologist. Similarly, attempts to maintain serum cholesterol, low density lipoproteins, and high-density lipoproteins in a favorable range were recommended.   Center involved in  the past CSME, now improved, yet still active temporally.  We will repeat again today injection intravitreal Avastin and follow-up in 6 to 7 weeks  Severe nonproliferative diabetic retinopathy of right eye, with macular edema, associated with type 2 diabetes mellitus ([place] By OCT will continue to monitor area and superior to the fovea, noncentral involved CSME.      ICD-10-CM   1. Severe nonproliferative diabetic retinopathy of left eye, with macular edema, associated with type 2 diabetes mellitus (HCC)  EK93.8182OCT, Retina - OU - Both Eyes    Intravitreal Injection, Pharmacologic Agent - OS - Left Eye    bevacizumab (AVASTIN) SOSY 2.5 mg  2. Severe nonproliferative diabetic retinopathy of right eye, with macular edema, associated with type 2 diabetes mellitus ([place]  E11.3411     1.  Repeat injection intravitreal Avastin OS today  2.  CSME OD has improved noncentral involved status post focal laser November 2021 we will continue to monitor  3.  Ophthalmic Meds Ordered this visit:  Meds ordered this encounter  Medications  . bevacizumab (AVASTIN) SOSY 2.5 mg       Return in about 6 weeks (around 03/26/2021), or ,, 6 to 7 weeks, for AVASTIN OCT, dilate, OS.  There are no Patient Instructions on file for this visit.   Explained the diagnoses, plan, and follow up with the patient and they expressed understanding.  Patient expressed understanding of the importance of proper follow up care.   GClent DemarkRankin M.D. Diseases & Surgery of the Retina and Vitreous Retina & Diabetic ERincon03/29/22     Abbreviations: M myopia (nearsighted); A astigmatism; H hyperopia (farsighted); P presbyopia; Mrx spectacle prescription;  CTL contact lenses; OD right eye; OS left eye; OU both eyes  XT exotropia; ET esotropia; PEK punctate epithelial keratitis; PEE punctate epithelial erosions; DES dry eye syndrome; MGD meibomian gland dysfunction; ATs artificial tears; PFAT's preservative free  artificial tears; [place] nuclear sclerotic cataract; PSC posterior subcapsular cataract; ERM epi-retinal membrane; PVD posterior vitreous detachment; RD retinal detachment; DM diabetes mellitus; DR diabetic retinopathy; NPDR non-proliferative diabetic retinopathy; PDR proliferative diabetic retinopathy; CSME clinically significant macular edema; DME diabetic macular edema; dbh dot blot hemorrhages; CWS cotton wool spot; POAG primary open angle glaucoma; C/D cup-to-disc ratio; HVF humphrey visual field; GVF goldmann visual field; OCT optical coherence tomography; IOP intraocular pressure; BRVO Branch retinal vein occlusion; CRVO central retinal vein occlusion; CRAO central retinal artery occlusion; BRAO branch retinal artery occlusion; RT retinal tear; SB scleral buckle; PPV pars plana vitrectomy; VH Vitreous hemorrhage; PRP panretinal laser photocoagulation; IVK intravitreal kenalog; VMT vitreomacular traction; MH Macular hole;  NVD neovascularization of the disc; NVE neovascularization elsewhere; AREDS age related eye disease study; ARMD age related macular degeneration; POAG primary open angle glaucoma; EBMD epithelial/anterior basement membrane dystrophy; ACIOL anterior chamber intraocular lens; IOL intraocular lens; PCIOL posterior chamber intraocular lens; Phaco/IOL phacoemulsification with intraocular lens placement; [place] photorefractive keratectomy; LASIK laser assisted in situ keratomileusis; HTN hypertension; DM diabetes mellitus; COPD chronic obstructive pulmonary disease

## 2021-02-12 NOTE — Assessment & Plan Note (Signed)
The nature of diabetic macular edema was discussed with the patient. Treatment options were outlined including medical therapy, laser & vitrectomy. The use of injectable medications reviewed, including Avastin, Lucentis, and Eylea. Periodic injections into the eye are likely to resolve diabetic macular edema (swelling in the center of vision). Initially, injections are delivered are delivered every 4-6 weeks, and the interval extended as the condition improves. On average, 8-9 injections the first year, and 5 in year 2. Improvement in the condition most often improves on medical therapy. Occasional use of focal laser is also recommended for residual macular edema (swelling). Excellent control of blood glucose and blood pressure are encouraged under the care of a primary physician or endocrinologist. Similarly, attempts to maintain serum cholesterol, low density lipoproteins, and high-density lipoproteins in a favorable range were recommended.   Center involved in the past CSME, now improved, yet still active temporally.  We will repeat again today injection intravitreal Avastin and follow-up in 6 to 7 weeks

## 2021-02-12 NOTE — Assessment & Plan Note (Signed)
By OCT will continue to monitor area and superior to the fovea, noncentral involved CSME.

## 2021-02-14 ENCOUNTER — Telehealth: Payer: Self-pay | Admitting: Emergency Medicine

## 2021-02-14 NOTE — Telephone Encounter (Signed)
Patient called and said that he is needing Dr. Alvy Bimler to send something to his insurance about his blood glucose meter. He can be reached at [phone]   [facility] Phone: [phone] Fax: [phone]

## 2021-02-20 NOTE — Telephone Encounter (Signed)
Spoke to patient let him know glucose meter with supplies was ordered, note to pharmacy it will be preference of his insurance the meter to receive. Patient understood message.

## 2021-02-21 ENCOUNTER — Telehealth: Payer: Self-pay | Admitting: Orthopaedic Surgery

## 2021-02-21 NOTE — Telephone Encounter (Signed)
Patient called. He would like someone to call him with the results from his test. His call back number is [phone]

## 2021-02-22 NOTE — Telephone Encounter (Signed)
Can you check on the results.  We still do not have them and it has been over a week

## 2021-02-25 LAB — URIC ACID

## 2021-02-25 LAB — RHEUMATOID FACTOR

## 2021-02-25 LAB — SEDIMENTATION RATE

## 2021-02-25 LAB — ANA

## 2021-02-25 LAB — TIQ-MISC

## 2021-02-25 NOTE — Telephone Encounter (Signed)
Called [facility] regarding lab results. They state it reached lab on 02/18/2021. Looks like this was collected in our office 02/12/2021. Only was able to result RF. This test can be up to 7 days. The ANA, Uric Acid & ESR are unable to result due to the timing.  The [facility] Rep recommended for me to double check with whoever collected blood work to see if it was actually collected 02/12/2021.   

## 2021-02-25 NOTE — Telephone Encounter (Signed)
Lets check with Morrie Sheldon.  Morrie Sheldon this is the gentleman that we attempted to get blood from while Marisue Ivan was out. We both stuck him and were unsuccessful. He came back later to be redrawn after drinking water. I did not redraw his labs. Can you maybe give Marisue Ivan some more information on when he came back and when labs were placed into box?

## 2021-02-25 NOTE — Telephone Encounter (Signed)
See message below please. Was this actually collected and put out in box 02/12/2021? Some how it reached [facility]  on 02/18/2021.

## 2021-02-26 ENCOUNTER — Telehealth: Payer: Self-pay

## 2021-02-26 NOTE — Telephone Encounter (Signed)
Patient came into the office he is still waiting to hear back from someone regarding his lab work call back:[phone]

## 2021-02-27 NOTE — Telephone Encounter (Signed)
Spoke to patient he states he did not come back to the office until Monday. He states lets just have it drawn again. Patient will come in tomorrow at 1pm for nurse visit to draw labs .

## 2021-02-27 NOTE — Telephone Encounter (Signed)
See other msg

## 2021-02-28 ENCOUNTER — Ambulatory Visit: Payer: Medicare HMO

## 2021-02-28 ENCOUNTER — Other Ambulatory Visit: Payer: Self-pay

## 2021-02-28 ENCOUNTER — Telehealth: Payer: Self-pay

## 2021-02-28 DIAGNOSIS — M25532 Pain in left wrist: Secondary | ICD-10-CM

## 2021-02-28 DIAGNOSIS — M25531 Pain in right wrist: Secondary | ICD-10-CM

## 2021-02-28 NOTE — Telephone Encounter (Signed)
Called [facility] to advise them to cx all labs drawn on 02/12/21.  Today He went to [facility] to have this lab work done. He also had other lab tests that PCP ordered.  I made sure he was not going to be charged double for tests. See other message.

## 2021-03-04 LAB — ANA: Anti Nuclear Antibody (ANA): POSITIVE — AB

## 2021-03-04 LAB — URIC ACID: Uric Acid, Serum: 8.9 mg/dL — ABNORMAL HIGH (ref 4.0–8.0)

## 2021-03-04 LAB — SEDIMENTATION RATE: Sed Rate: 34 mm/h — ABNORMAL HIGH (ref 0–20)

## 2021-03-04 LAB — ANTI-NUCLEAR AB-TITER (ANA TITER): ANA Titer 1: 1:40 {titer} — ABNORMAL HIGH

## 2021-03-04 LAB — RHEUMATOID FACTOR: Rheumatoid fact SerPl-aCnc: <14 IU/mL (ref <14)

## 2021-03-05 ENCOUNTER — Telehealth: Payer: Self-pay | Admitting: Emergency Medicine

## 2021-03-05 NOTE — Addendum Note (Signed)
Addended by: Fredda Hammed L on: 03/05/2021 11:20 AM   Modules accepted: Orders

## 2021-03-05 NOTE — Progress Notes (Signed)
I called pt and informed. He stated understanding . Referral was placed in chart

## 2021-03-05 NOTE — Telephone Encounter (Signed)
   [employer] calling to request order for [facility], reader and sensor

## 2021-03-05 NOTE — Progress Notes (Signed)
Please let him know his lab work is concerning autoimmune disease as well as gout so we will need to refer him to rheumatology.  He also needs to see his PCP about managing the gout.  Thanks.

## 2021-03-07 ENCOUNTER — Other Ambulatory Visit: Payer: Self-pay

## 2021-03-07 DIAGNOSIS — E1169 Type 2 diabetes mellitus with other specified complication: Secondary | ICD-10-CM

## 2021-03-07 MED ORDER — FREESTYLE LIBRE SENSOR SYSTEM MISC
1.0000 | Freq: Every day | 0 refills | Status: AC
Start: 2021-03-07 — End: ?

## 2021-03-14 ENCOUNTER — Telehealth: Payer: Self-pay | Admitting: Emergency Medicine

## 2021-03-14 NOTE — Telephone Encounter (Signed)
[facility]; Steven Knapp has called wanting to make the provider aware of the elevated ESR levels and the abnormal anemia.  The patient is aware, she just wanted to discuss with the provider  She is okay with a text/call at [phone]

## 2021-03-21 NOTE — Telephone Encounter (Signed)
Thanks

## 2021-03-21 NOTE — Telephone Encounter (Signed)
Spoke to patient he has an appointment with Dr Corliss Skains on 04/30/2021. Thanks.

## 2021-03-21 NOTE — Telephone Encounter (Signed)
Needs referral for rheumatology.  Thanks.

## 2021-03-21 NOTE — Telephone Encounter (Signed)
I am routing this message to you to review. thanks

## 2021-03-26 ENCOUNTER — Encounter (INDEPENDENT_AMBULATORY_CARE_PROVIDER_SITE_OTHER): Payer: Self-pay | Admitting: Ophthalmology

## 2021-03-26 ENCOUNTER — Other Ambulatory Visit: Payer: Self-pay

## 2021-03-26 ENCOUNTER — Ambulatory Visit (INDEPENDENT_AMBULATORY_CARE_PROVIDER_SITE_OTHER): Payer: Medicare HMO | Admitting: Ophthalmology

## 2021-03-26 DIAGNOSIS — E113412 Type 2 diabetes mellitus with severe nonproliferative diabetic retinopathy with macular edema, left eye: Secondary | ICD-10-CM

## 2021-03-26 MED ORDER — BEVACIZUMAB 2.5 MG/0.1ML IZ SOSY
2.5000 mg | PREFILLED_SYRINGE | INTRAVITREAL | Status: AC | PRN
  Administered 2021-03-26: 2.5 mg via INTRAVITREAL

## 2021-03-26 NOTE — Assessment & Plan Note (Signed)
The nature of diabetic macular edema was discussed with the patient. Treatment options were outlined including medical therapy, laser & vitrectomy. The use of injectable medications reviewed, including Avastin, Lucentis, and Eylea. Periodic injections into the eye are likely to resolve diabetic macular edema (swelling in the center of vision). Initially, injections are delivered are delivered every 4-6 weeks, and the interval extended as the condition improves. On average, 8-9 injections the first year, and 5 in year 2. Improvement in the condition most often improves on medical therapy. Occasional use of focal laser is also recommended for residual macular edema (swelling). Excellent control of blood glucose and blood pressure are encouraged under the care of a primary physician or endocrinologist. Similarly, attempts to maintain serum cholesterol, low density lipoproteins, and high-density lipoproteins in a favorable range were recommended.   OS overall much less active CSME on therapy currently at 6-week follow-up.  Repeat injection today to continue its improvement and follow-up again in 6 to 7 weeks

## 2021-03-26 NOTE — Progress Notes (Signed)
03/26/2021     CHIEF COMPLAINT Patient presents for Retina Follow Up (6 Wk F/U OS, poss Avastin OS//Pt denies noticeable changes to [place] OU since last visit. Pt denies ocular pain, flashes of light, or floaters OU. //A1c: 7.2, 3 weeks ago/LBS: waiting for equipment)   HISTORY OF PRESENT ILLNESS: Steven Knapp is a 75 y.o. male who presents to the clinic today for:   HPI    Retina Follow Up    Diagnosis: Diabetic Retinopathy   Laterality: left eye   Onset: 6 weeks ago   Severity: mild   Duration: 6 weeks   Course: stable   Comments: 6 Wk F/U OS, poss Avastin OS  Pt denies noticeable changes to [place] OU since last visit. Pt denies ocular pain, flashes of light, or floaters OU.   A1c: 7.2, 3 weeks ago LBS: waiting for equipment       Last edited by [name], [place] on 03/26/2021  9:23 AM. (History)      Referring physician: Horald Pollen, MD [place],  [place] [address]  HISTORICAL INFORMATION:   Selected notes from the MEDICAL RECORD NUMBER    Lab Results  Component Value Date   HGBA1C 7.3 (A) 01/28/2021     CURRENT MEDICATIONS: Current Outpatient Medications (Ophthalmic Drugs)  Medication Sig  . BEPREVE 1.5 % SOLN 1 drop 2 (two) times daily.  . SYSTANE ULTRA 0.4-0.3 % SOLN SMARTSIG:1 Drop(s) In Eye(s) As Needed  . timolol (TIMOPTIC) 0.5 % ophthalmic solution Place into both eyes daily.    No current facility-administered medications for this visit. (Ophthalmic Drugs)   Current Outpatient Medications (Other)  Medication Sig  . blood glucose meter kit and supplies Dispense based on patient and insurance preference. Use up to four times daily as directed. (FOR ICD-10 E10.9, E11.9).  . clobetasol cream (TEMOVATE) 3.54 % Apply 1 application topically 2 (two) times daily.  . Continuous Blood Gluc Sensor (FREESTYLE LIBRE SENSOR SYSTEM) MISC 1 Device by Does not apply route daily.  Marland Kitchen glipiZIDE (GLUCOTROL) 5 MG tablet Take 1 tablet (5 mg total) by mouth 2  (two) times daily before a meal.  . hydrochlorothiazide (HYDRODIURIL) 25 MG tablet hydrochlorothiazide 25 mg tablet  . HYDROcodone-acetaminophen (NORCO/VICODIN) 5-325 MG tablet Take 1 tablet by mouth every 6 (six) hours as needed for severe pain.  Marland Kitchen ibuprofen (ADVIL) 200 MG tablet Take 200 mg by mouth every 6 (six) hours as needed for moderate pain. (Patient not taking: Reported on 01/28/2021)  . indomethacin (INDOCIN) 50 MG capsule Take 1 capsule (50 mg total) by mouth 2 (two) times daily with a meal. (Patient not taking: Reported on 01/28/2021)  . Lactobacillus (PROBIOTIC ACIDOPHILUS PO) Take 1 capsule by mouth daily.  . predniSONE (STERAPRED UNI-PAK 21 TAB) 10 MG (21) TBPK tablet Take 6 tabs by mouth daily  for 2 days, then 5 tabs for 2 days, then 4 tabs for 2 days, then 3 tabs for 2 days, 2 tabs for 2 days, then 1 tab by mouth daily for 2 days (Patient not taking: Reported on 01/28/2021)  . predniSONE (STERAPRED UNI-PAK 21 TAB) 10 MG (21) TBPK tablet Take as directed  . rosuvastatin (CRESTOR) 10 MG tablet Take 1 tablet (10 mg total) by mouth daily.  . sildenafil (VIAGRA) 100 MG tablet TAKE ONE-HALF TO ONE TABLET BY MOUTH DAILY AS NEEDED FOR ERECTILE DYSFUNCTION   No current facility-administered medications for this visit. (Other)      REVIEW OF SYSTEMS:  ALLERGIES Allergies  Allergen Reactions  . Other Hives and Itching    Peanuts, strawberries, pickles, wine, and corn    PAST MEDICAL HISTORY Past Medical History:  Diagnosis Date  . Arthritis   . Cataract   . Diabetes mellitus without complication ([place])    History reviewed. No pertinent surgical history.  FAMILY HISTORY Family History  Problem Relation Age of Onset  . Diabetes Mother   . Hypertension Mother     SOCIAL HISTORY Social History   Tobacco Use  . Smoking status: Former Smoker    Years: 4.00    Types: Cigarettes  . Smokeless tobacco: Never Used  Substance Use Topics  . Alcohol use: Yes     Alcohol/week: 3.0 standard drinks    Types: 3 Standard drinks or equivalent per week  . Drug use: Never         OPHTHALMIC EXAM: Base Eye Exam    Visual Acuity (ETDRS)      Right Left   Dist [place] 20/20 20/25 +2       Tonometry (Tonopen, 9:26 AM)      Right Left   Pressure 11 12       Pupils      Dark Light Shape React APD   Right 3 3 Round Minimal None   Left 4 4 Round Minimal None       Visual Fields (Counting fingers)      Left Right    Full Full       Extraocular Movement   Alt XT OU       Neuro/Psych    Oriented x3: Yes   Mood/Affect: Normal       Dilation    Left eye: 1.0% Mydriacyl, 2.5% Phenylephrine @ 9:26 AM        Slit Lamp and Fundus Exam    External Exam      Right Left   External Normal Normal       Slit Lamp Exam      Right Left   Lids/Lashes Normal Normal   Conjunctiva/Sclera White and quiet White and quiet   Cornea Clear Clear   Anterior Chamber Deep and quiet Deep and quiet   Iris Round and reactive Round and reactive   Lens 2+ Nuclear sclerosis 2+ Nuclear sclerosis   Anterior Vitreous Normal Normal       Fundus Exam      Right Left   Posterior Vitreous  Posterior vitreous detachment, Central vitreous floaters   Disc  Normal   C/D Ratio  0.5   Macula  Mild clinically significant macular edema, Exudates, Microaneurysms   Vessels  NPDR severe   Periphery  Normal          IMAGING AND PROCEDURES  Imaging and Procedures for 03/26/21  OCT, Retina - OU - Both Eyes       Right Eye Quality was good. Scan locations included subfoveal. Central Foveal Thickness: 293. Progression has been stable. Findings include abnormal foveal contour.   Left Eye Quality was good. Scan locations included subfoveal. Central Foveal Thickness: 297. Progression has been stable. Findings include abnormal foveal contour.   Notes Will monitor CSME focal superior to the fovea OD  OS with prior center involved CSME, slightly improved again at  6-week interval       Intravitreal Injection, Pharmacologic Agent - OS - Left Eye       Time Out 03/26/2021. 10:33 AM. Confirmed correct patient, procedure, site, and patient consented.   Anesthesia  Topical anesthesia was used. Anesthetic medications included [place] 3.5%.   Procedure Preparation included Ofloxacin , Tobramycin 0.3%, 10% betadine to eyelids, 5% betadine to ocular surface. A 30 gauge needle was used.   Injection:  2.5 mg Bevacizumab (AVASTIN) 2.5mg /0.75mL SOSY   NDC: 66440-347-42, Lot: [id number]   Route: Intravitreal, Site: Left Eye  Post-op Post injection exam found visual acuity of at least counting fingers. The patient tolerated the procedure well. There were no complications. The patient received written and verbal post procedure care education. Post injection medications were not given.                 ASSESSMENT/PLAN:  Severe nonproliferative diabetic retinopathy of left eye, with macular edema, associated with type 2 diabetes mellitus ([place])  The nature of diabetic macular edema was discussed with the patient. Treatment options were outlined including medical therapy, laser & vitrectomy. The use of injectable medications reviewed, including Avastin, Lucentis, and Eylea. Periodic injections into the eye are likely to resolve diabetic macular edema (swelling in the center of vision). Initially, injections are delivered are delivered every 4-6 weeks, and the interval extended as the condition improves. On average, 8-9 injections the first year, and 5 in year 2. Improvement in the condition most often improves on medical therapy. Occasional use of focal laser is also recommended for residual macular edema (swelling). Excellent control of blood glucose and blood pressure are encouraged under the care of a primary physician or endocrinologist. Similarly, attempts to maintain serum cholesterol, low density lipoproteins, and high-density lipoproteins in a favorable range  were recommended.   OS overall much less active CSME on therapy currently at 6-week follow-up.  Repeat injection today to continue its improvement and follow-up again in 6 to 7 weeks      ICD-10-CM   1. Severe nonproliferative diabetic retinopathy of left eye, with macular edema, associated with type 2 diabetes mellitus (HCC)  F64.3329 OCT, Retina - OU - Both Eyes    Intravitreal Injection, Pharmacologic Agent - OS - Left Eye    bevacizumab (AVASTIN) SOSY 2.5 mg    1.  OS with CSME improving on intravitreal Avastin and preservation of acuity currently at 6-week follow-up post injection, repeat injection today and follow-up again in 6 weeks  2.  By OCT examination the right eye also is remaining stable on watch and observe the region superior to the fovea OD  3.  Ophthalmic Meds Ordered this visit:  Meds ordered this encounter  Medications  . bevacizumab (AVASTIN) SOSY 2.5 mg       Return in about 6 weeks (around 05/07/2021) for dilate, OS, AVASTIN OCT.  There are no Patient Instructions on file for this visit.   Explained the diagnoses, plan, and follow up with the patient and they expressed understanding.  Patient expressed understanding of the importance of proper follow up care.   Clent Demark Quantrell Splitt M.D. Diseases & Surgery of the Retina and Vitreous Retina [facility] Diabetic [place] 03/26/21     Abbreviations: M myopia (nearsighted); A astigmatism; H hyperopia (farsighted); P presbyopia; Mrx spectacle prescription;  CTL contact lenses; OD right eye; OS left eye; OU both eyes  XT exotropia; ET esotropia; PEK punctate epithelial keratitis; PEE punctate epithelial erosions; DES dry eye syndrome; MGD meibomian gland dysfunction; ATs artificial tears; PFAT's preservative free artificial tears; [place] nuclear sclerotic cataract; PSC posterior subcapsular cataract; ERM epi-retinal membrane; PVD posterior vitreous detachment; RD retinal detachment; DM diabetes mellitus; DR diabetic  retinopathy; NPDR non-proliferative diabetic retinopathy; PDR  proliferative diabetic retinopathy; CSME clinically significant macular edema; DME diabetic macular edema; dbh dot blot hemorrhages; CWS cotton wool spot; POAG primary open angle glaucoma; C/D cup-to-disc ratio; HVF humphrey visual field; GVF goldmann visual field; OCT optical coherence tomography; IOP intraocular pressure; BRVO Branch retinal vein occlusion; CRVO central retinal vein occlusion; CRAO central retinal artery occlusion; BRAO branch retinal artery occlusion; RT retinal tear; SB scleral buckle; PPV pars plana vitrectomy; VH Vitreous hemorrhage; PRP panretinal laser photocoagulation; IVK intravitreal kenalog; VMT vitreomacular traction; MH Macular hole;  NVD neovascularization of the disc; NVE neovascularization elsewhere; AREDS age related eye disease study; ARMD age related macular degeneration; POAG primary open angle glaucoma; EBMD epithelial/anterior basement membrane dystrophy; ACIOL anterior chamber intraocular lens; IOL intraocular lens; PCIOL posterior chamber intraocular lens; Phaco/IOL phacoemulsification with intraocular lens placement; [place] photorefractive keratectomy; LASIK laser assisted in situ keratomileusis; HTN hypertension; DM diabetes mellitus; COPD chronic obstructive pulmonary disease

## 2021-04-11 NOTE — Telephone Encounter (Signed)
[facility] has called again. The provider there is still wanting to speak with Dr.Sagardia.   Made them aware of the Rheumatology appointment.    Please text number [phone]

## 2021-04-11 NOTE — Telephone Encounter (Signed)
The provider from [facility] called back and Dr. Alvy Bimler spoke with them

## 2021-04-11 NOTE — Telephone Encounter (Signed)
Just called the number but no answer.

## 2021-04-18 NOTE — Progress Notes (Signed)
Office Visit Note  Patient: Steven Knapp             Date of Birth: Mar 31, 1946           MRN: [id number]             PCP: Horald Pollen, MD Referring: Leandrew Koyanagi, MD Visit Date: 04/30/2021 Occupation: '@GUAROCC' @  Subjective:  Pain in multiple joints.   History of Present Illness: Steven Knapp is a 75 y.o. male in consultation at the request of Dr. Erlinda Hong according the patient he developed symptoms of gout in 1986.  The gout is started in his toes.  He states he initially had a shot of cortisone and after that he had some dietary modifications.  The gout used to be about once a month and with the dietary modifications by year 2000 and he will have only 2 episodes per year.  He states the last episode was last year around spring.  He takes prednisone on as needed basis for the flares.  He states about 1-1/2-year ago he moved from [place] to [place].  He has been experiencing swelling in his right wrist joint.  He also describes discomfort in his shoulders, elbows, right wrist, hands, knees, ankles and his feet.  He was evaluated by Dr. Sherrian Divers in March 2022.  We did x-rays and labs and referred him here for further evaluation.  He states he was diagnosed with Prurigo nodularis in 2002 while he was living in [place].  He was under care of a dermatologist there.  Since he moved here he has been under care of Dr. April Manson at [facility] dermatology.  He has been receiving cortisone shots in the nodules.  She is also discussed possible systemic therapy.  Activities of Daily Living:  Patient reports morning stiffness for all day. Patient Reports nocturnal pain.  Difficulty dressing/grooming: Denies Difficulty climbing stairs: Denies Difficulty getting out of chair: Denies Difficulty using hands for taps, buttons, cutlery, and/or writing: Reports  Review of Systems  Constitutional:  Negative for fatigue.  HENT:  Negative for mouth sores, mouth dryness and nose dryness.   Eyes:  Positive for  dryness. Negative for pain and itching.  Respiratory:  Negative for shortness of breath and difficulty breathing.   Cardiovascular:  Negative for chest pain and palpitations.  Gastrointestinal:  Positive for diarrhea. Negative for blood in stool and constipation.       Occasional  Endocrine: Negative for increased urination.  Genitourinary:  Negative for difficulty urinating.  Musculoskeletal:  Positive for joint pain, joint pain, joint swelling, myalgias, morning stiffness, muscle tenderness and myalgias.  Skin:  Positive for rash. Negative for color change.  Allergic/Immunologic: Negative for susceptible to infections.  Neurological:  Negative for dizziness, numbness, headaches, memory loss and weakness.  Hematological:  Negative for bruising/bleeding tendency.  Psychiatric/Behavioral:  Negative for confusion.    PMFS History:  Patient Active Problem List   Diagnosis Date Noted   Uncontrolled type 2 diabetes mellitus with hyperglycemia ([place]) 07/30/2020   Severe nonproliferative diabetic retinopathy of left eye, with macular edema, associated with type 2 diabetes mellitus ([place]) 02/21/2020   Severe nonproliferative diabetic retinopathy of right eye, with macular edema, associated with type 2 diabetes mellitus ([place]) 02/21/2020   Nuclear sclerotic cataract of both eyes 02/21/2020   Retinal hemorrhage of left eye 02/21/2020   Retinal exudates and deposits 02/21/2020   Alternating exotropia 02/21/2020   Diabetes mellitus ([place]) 10/06/2019   Spinal stenosis 10/06/2019   History of gout  10/06/2019   History of rheumatoid arthritis 10/06/2019   Osteoarthritis of wrist 11/05/2018   Gout 09/16/2018    Past Medical History:  Diagnosis Date   Arthritis    Cataract    Diabetes mellitus without complication ([place])    Spinal stenosis    per patient    Family History  Problem Relation Age of Onset   Diabetes Mother    Hypertension Mother    Diabetes Brother    Therapist, occupational  Daughter    Healthy Daughter    Healthy Son    Healthy Son    Scoliosis Son    History reviewed. No pertinent surgical history. Social History   Social History Narrative   Not on file   Immunization History  Administered Date(s) Administered   Fluad Quad(high Dose 65+) 07/30/2020   Moderna Sars-Covid-2 Vaccination 11/28/2019, 12/29/2019, 07/18/2020   Pneumococcal Polysaccharide-23 10/06/2019     Objective: Vital Signs: BP 127/72 (BP Location: Right Arm, Patient Position: Sitting, Cuff Size: Normal)   Pulse (!) 56   Resp 17   Ht 5' 11.75" (1.822 m)   Wt 212 lb 12.8 oz (96.5 kg)   BMI 29.06 kg/m    Physical Exam Vitals and nursing note reviewed.  Constitutional:      Appearance: He is well-developed.  HENT:     Head: Normocephalic and atraumatic.  Eyes:     Conjunctiva/sclera: Conjunctivae normal.     Pupils: Pupils are equal, round, and reactive to light.  Cardiovascular:     Rate and Rhythm: Normal rate and regular rhythm.     Heart sounds: Normal heart sounds.  Pulmonary:     Effort: Pulmonary effort is normal.     Breath sounds: Normal breath sounds.  Abdominal:     General: Bowel sounds are normal.     Palpations: Abdomen is soft.  Musculoskeletal:     Cervical back: Normal range of motion and neck supple.  Skin:    General: Skin is warm and dry.     Capillary Refill: Capillary refill takes less than 2 seconds.  Neurological:     Mental Status: He is alert and oriented to person, place, and time.  Psychiatric:        Behavior: Behavior normal.     Musculoskeletal Exam: He had discomfort with range of motion of cervical spine.  He was able to bend over and reach his toes.  He is describes some discomfort in the lumbar paravertebral region.  He had discomfort range of motion of bilateral shoulder joints.  Elbow joints and wrist joints with good range of motion.  He had right extensor tenosynovitis.  He had right fourth finger Dupuytren's contracture.  There  was no synovitis over MCPs.  PIP and DIP thickening was noted.  Hip joints with good range of motion.  Knee joints with good range of motion without any warmth swelling or effusion.  Ankle joints in good range of motion without any warmth swelling or effusion.  He had bilateral hammertoes with no synovitis.  CDAI Exam: CDAI Score: -- Patient Global: --; Provider Global: -- Swollen: --; Tender: -- Joint Exam 04/30/2021   No joint exam has been documented for this visit   There is currently no information documented on the homunculus. Go to the Rheumatology activity and complete the homunculus joint exam.  Investigation: No additional findings.  Imaging: No results found.   Recent Labs: Lab Results  Component Value Date   WBC 6.4 07/30/2020  HGB 11.8 (L) 07/30/2020   PLT 261 07/30/2020   NA 138 01/28/2021   K 4.2 01/28/2021   CL 104 01/28/2021   CO2 18 (L) 01/28/2021   GLUCOSE 171 (H) 01/28/2021   BUN 24 01/28/2021   CREATININE 1.60 (H) 01/28/2021   BILITOT 0.3 01/28/2021   ALKPHOS 65 01/28/2021   AST 18 01/28/2021   ALT 19 01/28/2021   PROT 7.1 01/28/2021   ALBUMIN 4.0 01/28/2021   CALCIUM 9.8 01/28/2021   GFRAA 59 (L) 07/30/2020    Speciality Comments: No specialty comments available.  Procedures:  No procedures performed Allergies: Other and Shellfish allergy   Assessment / Plan:     Visit Diagnoses: Chronic pain of both shoulders -patient has been experiencing pain and discomfort in the bilateral shoulders.  He had difficulty raising both arms although range of motion was full.  Plan: XR Shoulder Left, XR Shoulder Right.  X-ray showed bilateral glenohumeral and acromioclavicular joint arthritis and chondrocalcinosis in the left shoulder.  Pain in left wrist - 02/28/21: ANA 1:40NS, uric acid 8.9, ESR 34, VZ<56 - Plan: Cyclic citrul peptide antibody, IgG  Pain in both hands-he complains of discomfort in his bilateral hands.  On examination the right extensor  tenosynovitis was noted.  No swelling over MCP joints was noted.  I also reviewed x-rays done by Dr. Sherrian Divers which showed severe narrowing of all PIP and DIP joints.  Erosive changes were noted in the first MCP joint and also in the bilateral wrist joints.  These findings could be consistent with gouty arthropathy.  It also raises concern about possible rheumatoid arthritis overlap.  Chronic pain of both knees -he complains of pain and discomfort in his bilateral knee joints.  No warmth swelling or effusion was noted.  Plan: XR KNEE 3 VIEW RIGHT, XR KNEE 3 VIEW LEFT x-rays are consistent with moderate osteoarthritis and severe chondromalacia patella.  Pain in both feet -complaints of pain and discomfort in his bilateral feet.  Radiographic findings and x-ray findings are consistent with osteoarthritis and gouty arthropathy.  Plan: XR Foot 2 Views Right, XR Foot 2 Views Left.  X-ray showed erosive changes in the bilateral first MTP joints and right fifth MTP joint.  History of gout-he gives history of gout for many years.  He states he has not been treated for gout and his symptoms have been controlled with dietary modifications.  DDD (degenerative disc disease), cervical-he has chronic discomfort in his cervical spine.  Prurigo nodularis-he was diagnosed in [place] by dermatologist.  He has been under care of Dr. April Manson at [facility] dermatology.  He states he gets cortisone injection in the lesions.  Other medical problems are listed as follows:  Uncontrolled type 2 diabetes mellitus with hyperglycemia (HCC)  Severe nonproliferative diabetic retinopathy of right eye, with macular edema, associated with type 2 diabetes mellitus (HCC)  Severe nonproliferative diabetic retinopathy of left eye, with macular edema, associated with type 2 diabetes mellitus ([name])  Nuclear sclerotic cataract of both eyes  Regular alcohol consumption - three times a week.  Elevated serum creatinine - Plan: COMPLETE  METABOLIC PANEL WITH GFR, Urinalysis, Routine w reflex microscopic, Serum protein electrophoresis with reflex  Positive ANA (antinuclear antibody) -he has positive ANA but no other clinical features of autoimmune disease.  ANA is low titer.  I will obtain some additional labs today.  Plan: Glucose 6 phosphate dehydrogenase, Anti-Smith antibody, Anti-DNA antibody, double-stranded  Orders: Orders Placed This Encounter  Procedures   XR Shoulder Left  XR Shoulder Right   XR KNEE 3 VIEW RIGHT   XR KNEE 3 VIEW LEFT   XR Foot 2 Views Right   XR Foot 2 Views Left   COMPLETE METABOLIC PANEL WITH GFR   Urinalysis, Routine w reflex microscopic   Cyclic citrul peptide antibody, IgG   Glucose 6 phosphate dehydrogenase   Anti-Smith antibody   Anti-DNA antibody, double-stranded   Serum protein electrophoresis with reflex    No orders of the defined types were placed in this encounter.    Follow-Up Instructions: Return for Inflammatory arthritis, gout.   Bo Merino, MD  Note - This record has been created using Editor, commissioning.  Chart creation errors have been sought, but may not always  have been located. Such creation errors do not reflect on  the standard of medical care.

## 2021-04-30 ENCOUNTER — Ambulatory Visit: Payer: Self-pay

## 2021-04-30 ENCOUNTER — Other Ambulatory Visit: Payer: Self-pay

## 2021-04-30 ENCOUNTER — Ambulatory Visit: Payer: Medicare HMO | Admitting: Rheumatology

## 2021-04-30 ENCOUNTER — Encounter: Payer: Self-pay | Admitting: Rheumatology

## 2021-04-30 VITALS — BP 127/72 | HR 56 | Resp 17 | Ht 71.75 in | Wt 212.8 lb

## 2021-04-30 DIAGNOSIS — E113411 Type 2 diabetes mellitus with severe nonproliferative diabetic retinopathy with macular edema, right eye: Secondary | ICD-10-CM

## 2021-04-30 DIAGNOSIS — Z8739 Personal history of other diseases of the musculoskeletal system and connective tissue: Secondary | ICD-10-CM

## 2021-04-30 DIAGNOSIS — H2513 Age-related nuclear cataract, bilateral: Secondary | ICD-10-CM

## 2021-04-30 DIAGNOSIS — E113412 Type 2 diabetes mellitus with severe nonproliferative diabetic retinopathy with macular edema, left eye: Secondary | ICD-10-CM

## 2021-04-30 DIAGNOSIS — M25562 Pain in left knee: Secondary | ICD-10-CM

## 2021-04-30 DIAGNOSIS — M79672 Pain in left foot: Secondary | ICD-10-CM | POA: Diagnosis not present

## 2021-04-30 DIAGNOSIS — M25512 Pain in left shoulder: Secondary | ICD-10-CM | POA: Diagnosis not present

## 2021-04-30 DIAGNOSIS — R768 Other specified abnormal immunological findings in serum: Secondary | ICD-10-CM

## 2021-04-30 DIAGNOSIS — E1165 Type 2 diabetes mellitus with hyperglycemia: Secondary | ICD-10-CM

## 2021-04-30 DIAGNOSIS — M25561 Pain in right knee: Secondary | ICD-10-CM | POA: Diagnosis not present

## 2021-04-30 DIAGNOSIS — M25532 Pain in left wrist: Secondary | ICD-10-CM | POA: Diagnosis not present

## 2021-04-30 DIAGNOSIS — M5030 Other cervical disc degeneration, unspecified cervical region: Secondary | ICD-10-CM

## 2021-04-30 DIAGNOSIS — G8929 Other chronic pain: Secondary | ICD-10-CM | POA: Diagnosis not present

## 2021-04-30 DIAGNOSIS — M25511 Pain in right shoulder: Secondary | ICD-10-CM

## 2021-04-30 DIAGNOSIS — M79671 Pain in right foot: Secondary | ICD-10-CM | POA: Diagnosis not present

## 2021-04-30 DIAGNOSIS — L281 Prurigo nodularis: Secondary | ICD-10-CM

## 2021-04-30 DIAGNOSIS — Z789 Other specified health status: Secondary | ICD-10-CM

## 2021-04-30 DIAGNOSIS — M79641 Pain in right hand: Secondary | ICD-10-CM

## 2021-04-30 DIAGNOSIS — R7989 Other specified abnormal findings of blood chemistry: Secondary | ICD-10-CM

## 2021-04-30 DIAGNOSIS — M79642 Pain in left hand: Secondary | ICD-10-CM

## 2021-05-06 LAB — PROTEIN ELECTROPHORESIS, SERUM, WITH REFLEX
Albumin ELP: 4.2 g/dL (ref 3.8–4.8)
Alpha 1: 0.3 g/dL (ref 0.2–0.3)
Alpha 2: 0.8 g/dL (ref 0.5–0.9)
Beta 2: 0.5 g/dL (ref 0.2–0.5)
Beta Globulin: 0.5 g/dL (ref 0.4–0.6)
Gamma Globulin: 1.2 g/dL (ref 0.8–1.7)
Total Protein: 7.5 g/dL (ref 6.1–8.1)

## 2021-05-06 LAB — CYCLIC CITRUL PEPTIDE ANTIBODY, IGG: Cyclic Citrullin Peptide Ab: <16 UNITS

## 2021-05-06 LAB — URINALYSIS, ROUTINE W REFLEX MICROSCOPIC
Bacteria, UA: NONE SEEN /HPF
Bilirubin Urine: NEGATIVE
Glucose, UA: NEGATIVE
Hgb urine dipstick: NEGATIVE
Hyaline Cast: NONE SEEN /LPF
Ketones, ur: NEGATIVE
Leukocytes,Ua: NEGATIVE
Nitrite: NEGATIVE
RBC / HPF: NONE SEEN /HPF (ref 0–2)
Specific Gravity, Urine: 1.019 (ref 1.001–1.035)
Squamous Epithelial / HPF: NONE SEEN /HPF (ref <=5)
WBC, UA: NONE SEEN /HPF (ref 0–5)
pH: <=5 (ref 5.0–8.0)

## 2021-05-06 LAB — GLUCOSE 6 PHOSPHATE DEHYDROGENASE: G-6PDH: 16.7 U/g Hgb (ref 7.0–20.5)

## 2021-05-06 LAB — COMPLETE METABOLIC PANEL WITH GFR
AG Ratio: 1.4 (calc) (ref 1.0–2.5)
ALT: 20 U/L (ref 9–46)
AST: 25 U/L (ref 10–35)
Albumin: 4.3 g/dL (ref 3.6–5.1)
Alkaline phosphatase (APISO): 55 U/L (ref 35–144)
BUN/Creatinine Ratio: 20 (calc) (ref 6–22)
BUN: 33 mg/dL — ABNORMAL HIGH (ref 7–25)
CO2: 25 mmol/L (ref 20–32)
Calcium: 10.2 mg/dL (ref 8.6–10.3)
Chloride: 106 mmol/L (ref 98–110)
Creat: 1.67 mg/dL — ABNORMAL HIGH (ref 0.70–1.18)
GFR, Est African American: 45 mL/min/{1.73_m2} — ABNORMAL LOW (ref >=60)
GFR, Est Non African American: 39 mL/min/{1.73_m2} — ABNORMAL LOW (ref >=60)
Globulin: 3.1 g/dL (calc) (ref 1.9–3.7)
Glucose, Bld: 134 mg/dL — ABNORMAL HIGH (ref 65–99)
Potassium: 4.6 mmol/L (ref 3.5–5.3)
Sodium: 139 mmol/L (ref 135–146)
Total Bilirubin: 0.6 mg/dL (ref 0.2–1.2)
Total Protein: 7.4 g/dL (ref 6.1–8.1)

## 2021-05-06 LAB — ANTI-DNA ANTIBODY, DOUBLE-STRANDED: ds DNA Ab: <1 IU/mL

## 2021-05-06 LAB — ANTI-SMITH ANTIBODY: ENA SM Ab Ser-aCnc: <1 AI

## 2021-05-06 LAB — MICROSCOPIC MESSAGE

## 2021-05-06 LAB — IFE INTERPRETATION: Immunofix Electr Int: NOT DETECTED

## 2021-05-06 NOTE — Progress Notes (Signed)
I will discuss results at the follow-up visit.

## 2021-05-07 ENCOUNTER — Encounter (INDEPENDENT_AMBULATORY_CARE_PROVIDER_SITE_OTHER): Payer: Medicare HMO | Admitting: Ophthalmology

## 2021-05-13 ENCOUNTER — Other Ambulatory Visit: Payer: Self-pay

## 2021-05-13 ENCOUNTER — Encounter (INDEPENDENT_AMBULATORY_CARE_PROVIDER_SITE_OTHER): Payer: Self-pay | Admitting: Ophthalmology

## 2021-05-13 ENCOUNTER — Ambulatory Visit (INDEPENDENT_AMBULATORY_CARE_PROVIDER_SITE_OTHER): Payer: Medicare HMO | Admitting: Ophthalmology

## 2021-05-13 DIAGNOSIS — H2513 Age-related nuclear cataract, bilateral: Secondary | ICD-10-CM

## 2021-05-13 DIAGNOSIS — E113413 Type 2 diabetes mellitus with severe nonproliferative diabetic retinopathy with macular edema, bilateral: Secondary | ICD-10-CM | POA: Diagnosis not present

## 2021-05-13 DIAGNOSIS — E113412 Type 2 diabetes mellitus with severe nonproliferative diabetic retinopathy with macular edema, left eye: Secondary | ICD-10-CM

## 2021-05-13 DIAGNOSIS — E113411 Type 2 diabetes mellitus with severe nonproliferative diabetic retinopathy with macular edema, right eye: Secondary | ICD-10-CM

## 2021-05-13 MED ORDER — BEVACIZUMAB 2.5 MG/0.1ML IZ SOSY
2.5000 mg | PREFILLED_SYRINGE | INTRAVITREAL | Status: AC | PRN
  Administered 2021-05-13: 2.5 mg via INTRAVITREAL

## 2021-05-13 NOTE — Assessment & Plan Note (Signed)
Nuclear sclerosis progressing

## 2021-05-13 NOTE — Assessment & Plan Note (Signed)
Improved thickening left eye, will repeat injection today and consider focal laser treatment next or long-term resolution and decreasing treatment burden

## 2021-05-13 NOTE — Assessment & Plan Note (Signed)
CSME focal superior to the fovea has resolved after treatment as well as focal laser treatment

## 2021-05-13 NOTE — Progress Notes (Signed)
  05/13/2021     CHIEF COMPLAINT Patient presents for Retina Follow Up (7 Wk F/U OS, poss Avastin OS//Pt c/o fluctuating VA OS off and on, especially when blood sugar fluctuates. Pt denies any new symptoms OU./LBS: did not check)   HISTORY OF PRESENT ILLNESS: Steven Knapp is a 75 y.o. male who presents to the clinic today for:   HPI     Retina Follow Up           Diagnosis: Diabetic Retinopathy   Laterality: left eye   Onset: 7 weeks ago   Severity: mild   Duration: 7 weeks   Course: stable   Comments: 7 Wk F/U OS, poss Avastin OS  Pt c/o fluctuating VA OS off and on, especially when blood sugar fluctuates. Pt denies any new symptoms OU. LBS: did not check       Last edited by Staton, Paige, COA on 05/13/2021  8:53 AM.      Referring physician: Sagardia, Miguel Jose, MD [address] [place],  [place] [address]  HISTORICAL INFORMATION:   Selected notes from the medical record:     Lab Results  Component Value Date   HGBA1C 7.3 (A) 01/28/2021     CURRENT MEDICATIONS: Current Outpatient Medications (Ophthalmic Drugs)  Medication Sig   BEPREVE 1.5 % SOLN 1 drop 2 (two) times daily.   SYSTANE ULTRA 0.4-0.3 % SOLN SMARTSIG:1 Drop(s) In Eye(s) As Needed   No current facility-administered medications for this visit. (Ophthalmic Drugs)   Current Outpatient Medications (Other)  Medication Sig   blood glucose meter kit and supplies Dispense based on patient and insurance preference. Use up to four times daily as directed. (FOR ICD-10 E10.9, E11.9).   clobetasol cream (TEMOVATE) 0.05 % Apply 1 application topically 2 (two) times daily.   Continuous Blood Gluc Sensor (FREESTYLE LIBRE SENSOR SYSTEM) MISC 1 Device by Does not apply route daily.   glipiZIDE (GLUCOTROL) 5 MG tablet Take 1 tablet (5 mg total) by mouth 2 (two) times daily before a meal.   hydrochlorothiazide (HYDRODIURIL) 25 MG tablet hydrochlorothiazide 25 mg tablet   HYDROcodone-acetaminophen (NORCO/VICODIN)  5-325 MG tablet Take 1 tablet by mouth every 6 (six) hours as needed for severe pain.   ibuprofen (ADVIL) 200 MG tablet Take 200 mg by mouth every 6 (six) hours as needed for moderate pain.   Lactobacillus (PROBIOTIC ACIDOPHILUS PO) Take 1 capsule by mouth as needed.   rosuvastatin (CRESTOR) 10 MG tablet Take 1 tablet (10 mg total) by mouth daily.   sildenafil (VIAGRA) 100 MG tablet TAKE ONE-HALF TO ONE TABLET BY MOUTH DAILY AS NEEDED FOR ERECTILE DYSFUNCTION   No current facility-administered medications for this visit. (Other)      REVIEW OF SYSTEMS:    ALLERGIES Allergies  Allergen Reactions   Other Hives and Itching    Peanuts, strawberries, pickles, wine, and corn   Shellfish Allergy     PAST MEDICAL HISTORY Past Medical History:  Diagnosis Date   Arthritis    Cataract    Diabetes mellitus without complication (HCC)    Spinal stenosis    per patient   History reviewed. No pertinent surgical history.  FAMILY HISTORY Family History  Problem Relation Age of Onset   Diabetes Mother    Hypertension Mother    Diabetes Brother    Healthy Brother    Healthy Daughter    Healthy Daughter    Healthy Son    Healthy Son    Scoliosis Son       SOCIAL HISTORY Social History   Tobacco Use   Smoking status: Former    Years: 4.00    Pack years: 0.00    Types: Cigarettes   Smokeless tobacco: Never  Vaping Use   Vaping Use: Never used  Substance Use Topics   Alcohol use: Yes    Alcohol/week: 3.0 standard drinks    Types: 3 Standard drinks or equivalent per week    Comment: 2-3 times weekly   Drug use: Never         OPHTHALMIC EXAM:  Base Eye Exam     Visual Acuity (ETDRS)       Right Left   Dist [place] 20/20 20/25 +2         Tonometry (Tonopen, 8:56 AM)       Right Left   Pressure 14 12         Pupils       Dark Light Shape React APD   Right 2 2 Round Minimal None   Left 4 4 Round Minimal None         Visual Fields (Counting fingers)        Left Right    Full Full         Extraocular Movement       Right Left    Full Full         Neuro/Psych     Oriented x3: Yes   Mood/Affect: Normal         Dilation     Left eye: 1.0% Mydriacyl, 2.5% Phenylephrine @ 8:56 AM           Slit Lamp and Fundus Exam     External Exam       Right Left   External Normal Normal         Slit Lamp Exam       Right Left   Lids/Lashes Normal Normal   Conjunctiva/Sclera White and quiet White and quiet   Cornea Clear Clear   Anterior Chamber Deep and quiet Deep and quiet   Iris Round and reactive Round and reactive   Lens 2+ Nuclear sclerosis 2+ Nuclear sclerosis   Anterior Vitreous Normal Normal         Fundus Exam       Right Left   Posterior Vitreous  Posterior vitreous detachment, Central vitreous floaters   Disc  Normal   C/D Ratio  0.5   Macula  Mild clinically significant macular edema, Exudates, Microaneurysms   Vessels  NPDR severe   Periphery  Normal            IMAGING AND PROCEDURES  Imaging and Procedures for 05/13/21  OCT, Retina - OU - Both Eyes       Right Eye Quality was borderline. Scan locations included subfoveal. Central Foveal Thickness: 289. Progression has been stable. Findings include abnormal foveal contour.   Left Eye Quality was borderline. Scan locations included subfoveal. Central Foveal Thickness: 279. Progression has been stable. Findings include abnormal foveal contour, cystoid macular edema.   Notes Area superior to the fovea OD, resolved post recent focal  OS with prior center involved CSME, slightly improved again at 6-week interval, repeat injection OS today and consider focal laser treatment temporally OS     Intravitreal Injection, Pharmacologic Agent - OS - Left Eye       Time Out 05/13/2021. 9:53 AM. Confirmed correct patient, procedure, site, and patient consented.   Anesthesia Topical anesthesia was used. Anesthetic medications included  Akten  3.5%.   Procedure Preparation included Ofloxacin , Tobramycin 0.3%, 10% betadine to eyelids, 5% betadine to ocular surface. A 30 gauge needle was used.   Injection: 2.5 mg bevacizumab 2.5 MG/0.1ML   Route: Intravitreal, Site: Left Eye   NDC: 71449-091-43   Post-op Post injection exam found visual acuity of at least counting fingers. The patient tolerated the procedure well. There were no complications. The patient received written and verbal post procedure care education. Post injection medications were not given.              ASSESSMENT/PLAN:  Nuclear sclerotic cataract of both eyes Nuclear sclerosis progressing  Severe nonproliferative diabetic retinopathy of right eye, with macular edema, associated with type 2 diabetes mellitus (HCC) CSME focal superior to the fovea has resolved after treatment as well as focal laser treatment  Severe nonproliferative diabetic retinopathy of left eye, with macular edema, associated with type 2 diabetes mellitus (HCC) Improved thickening left eye, will repeat injection today and consider focal laser treatment next or long-term resolution and decreasing treatment burden     ICD-10-CM   1. Severe nonproliferative diabetic retinopathy of left eye, with macular edema, associated with type 2 diabetes mellitus (HCC)  E11.3412 OCT, Retina - OU - Both Eyes    Intravitreal Injection, Pharmacologic Agent - OS - Left Eye    bevacizumab (AVASTIN) SOSY 2.5 mg    2. Nuclear sclerotic cataract of both eyes  H25.13     3. Severe nonproliferative diabetic retinopathy of right eye, with macular edema, associated with type 2 diabetes mellitus (HCC)  E11.3411       1.  OD, CSME resolved  2.  OS, focal CSME temporally, will need to repeat intravitreal Avastin OS today followed by focal laser in the coming 4 weeks  3.  Ophthalmic Meds Ordered this visit:  Meds ordered this encounter  Medications   bevacizumab (AVASTIN) SOSY 2.5 mg       Return in  about 4 weeks (around 06/10/2021) for dilate, OS, FOCAL.  There are no Patient Instructions on file for this visit.   Explained the diagnoses, plan, and follow up with the patient and they expressed understanding.  Patient expressed understanding of the importance of proper follow up care.   Gary A. Rankin M.D. Diseases & Surgery of the Retina and Vitreous [facility] 05/13/21     Abbreviations: M myopia (nearsighted); A astigmatism; H hyperopia (farsighted); P presbyopia; Mrx spectacle prescription;  CTL contact lenses; OD right eye; OS left eye; OU both eyes  XT exotropia; ET esotropia; PEK punctate epithelial keratitis; PEE punctate epithelial erosions; DES dry eye syndrome; MGD meibomian gland dysfunction; ATs artificial tears; PFAT's preservative free artificial tears; NSC nuclear sclerotic cataract; PSC posterior subcapsular cataract; ERM epi-retinal membrane; PVD posterior vitreous detachment; RD retinal detachment; DM diabetes mellitus; DR diabetic retinopathy; NPDR non-proliferative diabetic retinopathy; PDR proliferative diabetic retinopathy; CSME clinically significant macular edema; DME diabetic macular edema; dbh dot blot hemorrhages; CWS cotton wool spot; POAG primary open angle glaucoma; C/D cup-to-disc ratio; HVF humphrey visual field; GVF goldmann visual field; OCT optical coherence tomography; IOP intraocular pressure; BRVO Branch retinal vein occlusion; CRVO central retinal vein occlusion; CRAO central retinal artery occlusion; BRAO branch retinal artery occlusion; RT retinal tear; SB scleral buckle; PPV pars plana vitrectomy; VH Vitreous hemorrhage; PRP panretinal laser photocoagulation; IVK intravitreal kenalog; VMT vitreomacular traction; MH Macular hole;  NVD neovascularization of the disc; NVE neovascularization elsewhere; AREDS age related eye disease study;   ARMD age related macular degeneration; POAG primary open angle glaucoma; EBMD epithelial/anterior  basement membrane dystrophy; ACIOL anterior chamber intraocular lens; IOL intraocular lens; PCIOL posterior chamber intraocular lens; Phaco/IOL phacoemulsification with intraocular lens placement; [place] photorefractive keratectomy; LASIK laser assisted in situ keratomileusis; HTN hypertension; DM diabetes mellitus; COPD chronic obstructive pulmonary disease

## 2021-05-24 DIAGNOSIS — M19042 Primary osteoarthritis, left hand: Secondary | ICD-10-CM | POA: Insufficient documentation

## 2021-05-24 DIAGNOSIS — M5030 Other cervical disc degeneration, unspecified cervical region: Secondary | ICD-10-CM | POA: Insufficient documentation

## 2021-05-24 DIAGNOSIS — M19012 Primary osteoarthritis, left shoulder: Secondary | ICD-10-CM | POA: Insufficient documentation

## 2021-05-24 DIAGNOSIS — M19072 Primary osteoarthritis, left ankle and foot: Secondary | ICD-10-CM | POA: Insufficient documentation

## 2021-05-24 DIAGNOSIS — L281 Prurigo nodularis: Secondary | ICD-10-CM | POA: Insufficient documentation

## 2021-05-24 DIAGNOSIS — M19071 Primary osteoarthritis, right ankle and foot: Secondary | ICD-10-CM | POA: Insufficient documentation

## 2021-05-24 DIAGNOSIS — M19011 Primary osteoarthritis, right shoulder: Secondary | ICD-10-CM | POA: Insufficient documentation

## 2021-05-24 DIAGNOSIS — N2889 Other specified disorders of kidney and ureter: Secondary | ICD-10-CM | POA: Insufficient documentation

## 2021-05-24 DIAGNOSIS — N1831 Chronic kidney disease, stage 3a: Secondary | ICD-10-CM | POA: Insufficient documentation

## 2021-05-24 DIAGNOSIS — M19041 Primary osteoarthritis, right hand: Secondary | ICD-10-CM | POA: Insufficient documentation

## 2021-05-24 NOTE — Progress Notes (Signed)
Office Visit Note  Patient: Steven Knapp             Date of Birth: 01/03/1946           MRN: [id number]             PCP: Georgina Quint, MD Referring: Georgina Quint, * Visit Date: 06/05/2021 Occupation: @  Subjective:  Joint pain.   History of Present Illness: Steven Knapp is a 75 y.o. male with a history of gout and osteoarthritis.  He states he has not had a flare in at least 6 months.  He had flares of gout about twice a year for which she takes colchicine.  He has some stiffness in his joints mostly in his hands, shoulders, knees and feet but no discomfort in particular.  He states he would like to have a handicap placard for the arthritis flare.  He was evaluated by Dr. Ronalee Belts (nephrologist).  He states he did some blood work and told him that his hemoglobin A1c was 7.1.  He also advised him to decrease calcium intake.  He has a follow-up appointment with him in September.  Activities of Daily Living:  Patient reports morning stiffness for 0 minutes.   Patient Denies nocturnal pain.  Difficulty dressing/grooming: Denies Difficulty climbing stairs: Denies Difficulty getting out of chair: Denies Difficulty using hands for taps, buttons, cutlery, and/or writing: Denies  Review of Systems  Constitutional:  Positive for fatigue.  HENT:  Negative for mouth sores, mouth dryness and nose dryness.   Eyes:  Positive for dryness. Negative for pain and itching.  Respiratory:  Negative for shortness of breath and difficulty breathing.   Cardiovascular:  Negative for chest pain and palpitations.  Gastrointestinal:  Negative for blood in stool, constipation and diarrhea.  Endocrine: Negative for increased urination.  Genitourinary:  Negative for difficulty urinating.  Musculoskeletal:  Negative for joint pain, joint pain, joint swelling, myalgias, morning stiffness, muscle tenderness and myalgias.  Skin:  Positive for rash. Negative for color change.       Prurigo  nodularis  Allergic/Immunologic: Positive for susceptible to infections.  Neurological:  Negative for dizziness, numbness, headaches, memory loss and weakness.  Hematological:  Negative for bruising/bleeding tendency.  Psychiatric/Behavioral:  Negative for confusion.    PMFS History:  Patient Active Problem List   Diagnosis Date Noted   Primary osteoarthritis of both shoulders 05/24/2021   Primary osteoarthritis of both hands 05/24/2021   Primary osteoarthritis of both feet 05/24/2021   DDD (degenerative disc disease), cervical 05/24/2021   Prurigo nodularis 05/24/2021   Chronic renal impairment, stage 3a (HCC) 05/24/2021   Uncontrolled type 2 diabetes mellitus with hyperglycemia (HCC) 07/30/2020   Severe nonproliferative diabetic retinopathy of left eye, with macular edema, associated with type 2 diabetes mellitus (HCC) 02/21/2020   Severe nonproliferative diabetic retinopathy of right eye, with macular edema, associated with type 2 diabetes mellitus (HCC) 02/21/2020   Nuclear sclerotic cataract of both eyes 02/21/2020   Retinal hemorrhage of left eye 02/21/2020   Retinal exudates and deposits 02/21/2020   Alternating exotropia 02/21/2020   Diabetes mellitus (HCC) 10/06/2019   Spinal stenosis 10/06/2019   History of gout 10/06/2019   History of rheumatoid arthritis 10/06/2019   Osteoarthritis of wrist 11/05/2018   Gout 09/16/2018    Past Medical History:  Diagnosis Date   Arthritis    Cataract    Diabetes mellitus without complication (HCC)    Spinal stenosis    per patient  Family History  Problem Relation Age of Onset   Diabetes Mother    Hypertension Mother    Diabetes Brother    Soil scientist Daughter    Healthy Daughter    Healthy Son    Healthy Son    Scoliosis Son    History reviewed. No pertinent surgical history. Social History   Social History Narrative   Not on file   Immunization History  Administered Date(s) Administered   Fluad  Quad(high Dose 65+) 07/30/2020   Moderna Sars-Covid-2 Vaccination 11/28/2019, 12/29/2019, 07/18/2020   Pneumococcal Polysaccharide-23 10/06/2019     Objective: Vital Signs: BP 126/76 (BP Location: Left Arm, Patient Position: Sitting, Cuff Size: Normal)   Pulse (!) 59   Resp 16   Ht 6\' 2"  (1.88 m)   Wt 210 lb 6.4 oz (95.4 kg)   BMI 27.01 kg/m    Physical Exam Vitals and nursing note reviewed.  Constitutional:      Appearance: He is well-developed.  HENT:     Head: Normocephalic and atraumatic.  Eyes:     Conjunctiva/sclera: Conjunctivae normal.     Pupils: Pupils are equal, round, and reactive to light.  Cardiovascular:     Rate and Rhythm: Normal rate and regular rhythm.     Heart sounds: Normal heart sounds.  Pulmonary:     Effort: Pulmonary effort is normal.     Breath sounds: Normal breath sounds.  Abdominal:     General: Bowel sounds are normal.     Palpations: Abdomen is soft.  Musculoskeletal:     Cervical back: Normal range of motion and neck supple.  Skin:    General: Skin is warm and dry.     Capillary Refill: Capillary refill takes less than 2 seconds.  Neurological:     Mental Status: He is alert and oriented to person, place, and time.  Psychiatric:        Behavior: Behavior normal.     Musculoskeletal Exam: C-spine was in good range of motion.  Shoulder joints were in good range of motion.  Contracture in his right elbow joint.  No synovitis was noted.  Left elbow joint was in good range of motion.  He has incomplete fist formation bilaterally.  He has bilateral PIP and DIP thickening.  Mild tenderness and swelling was noted in the right wrist joint.  Hip joints and knee joints with good range of motion.  He had no tenderness over ankles or MTPs.  CDAI Exam: CDAI Score: -- Patient Global: --; Provider Global: -- Swollen: --; Tender: -- Joint Exam 06/05/2021   No joint exam has been documented for this visit   There is currently no information  documented on the homunculus. Go to the Rheumatology activity and complete the homunculus joint exam.  Investigation: No additional findings.  Imaging: Intravitreal Injection, Pharmacologic Agent - OS - Left Eye  Result Date: 05/13/2021 Time Out 05/13/2021. 9:53 AM. Confirmed correct patient, procedure, site, and patient consented. Anesthesia Topical anesthesia was used. Anesthetic medications included Akten 3.5%. Procedure Preparation included Ofloxacin , Tobramycin 0.3%, 10% betadine to eyelids, 5% betadine to ocular surface. A 30 gauge needle was used. Injection: 2.5 mg bevacizumab 2.5 MG/0.1ML   Route: Intravitreal, Site: Left Eye   NDC: [phone] Post-op Post injection exam found visual acuity of at least counting fingers. The patient tolerated the procedure well. There were no complications. The patient received written and verbal post procedure care education. Post injection medications were not given.  OCT, Retina - OU - Both Eyes  Result Date: 05/13/2021 Right Eye Quality was borderline. Scan locations included subfoveal. Central Foveal Thickness: 289. Progression has been stable. Findings include abnormal foveal contour. Left Eye Quality was borderline. Scan locations included subfoveal. Central Foveal Thickness: 279. Progression has been stable. Findings include abnormal foveal contour, cystoid macular edema. Notes Area superior to the fovea OD, resolved post recent focal OS with prior center involved CSME, slightly improved again at 6-week interval, repeat injection OS today and consider focal laser treatment temporally OS   Recent Labs: Lab Results  Component Value Date   WBC 6.4 07/30/2020   HGB 11.8 (L) 07/30/2020   PLT 261 07/30/2020   NA 139 04/30/2021   K 4.6 04/30/2021   CL 106 04/30/2021   CO2 25 04/30/2021   GLUCOSE 134 (H) 04/30/2021   BUN 33 (H) 04/30/2021   CREATININE 1.67 (H) 04/30/2021   BILITOT 0.6 04/30/2021   ALKPHOS 65 01/28/2021   AST 25 04/30/2021    ALT 20 04/30/2021   PROT 7.4 04/30/2021   PROT 7.5 04/30/2021   ALBUMIN 4.0 01/28/2021   CALCIUM 10.2 04/30/2021   GFRAA 45 (L) 04/30/2021    June 13, 2021 UA negative except protein 1+, IFE negative, double-stranded ENA negative, Smith negative, anti-CCP negative, G6PD normal  Speciality Comments: Allopurinol-nausea  Procedures:  No procedures performed Allergies: Other and Shellfish allergy   Assessment / Plan:     Visit Diagnoses: Idiopathic chronic gout of multiple sites without tophus - Patient gives history of gout for many years.  He has been having episodes of gout about every 6 months per patient.  He states he was given allopurinol in the past but he could not take it due to nausea.  He takes colchicine on as needed basis.  He states that dietary modifications helped him.  He has significant hyperuricemia.  I detailed discussion with the patient regarding different treatment options and their side effects.  Indications side effects contraindications of Uloric and allopurinol were discussed.  Patient has taken Uloric in the past and had a good response but he discontinued.  We discussed starting on Uloric 40 mg p.o. daily.  He was in agreement.  We will send a prescription for Uloric 40 mg p.o. daily.  We will check labs in a month and then every 3 months to monitor for drug toxicity.  Once the labs are stable, we can be monitor labs every 6 months.He will take colchicine 0.6 mg p.o. daily.  Once his uric acid is in desirable range we can discontinue colchicine and he may need it only on as needed basis.  Dietary modifications and abstinence from alcohol was also discussed.     Hyperuricemia - February 28, 2021 uric acid 8.9  Primary osteoarthritis of both shoulders - H/o pain in bilateral shoulders.  X-rays at the last visit showed bilateral glenohumeral and acromioclavicular joint arthritis and chondrocalcinosis LSJ.  X-ray findings were discussed with the patient.  He is currently not  having much discomfort.  Primary osteoarthritis of both hands -he has incomplete fist formation.  He had some synovitis in his right wrist joint.  Clinical and radiographic findings were consistent with gouty arthropathy and osteoarthritis.  His sed rate was elevated at 34, RF was negative.  I would like to see response to Uloric use.  Dupuytren's contracture of left hand  Primary osteoarthritis of both feet - X-ray showed erosive changes and osteoarthritis.  He denies any discomfort in his  feet currently.  DDD (degenerative disc disease), cervical-he had good range of motion without discomfort.  Positive ANA (antinuclear antibody) - Double-stranded DNA negative, Smith negative, anti-CCP negative.  He has no clinical features of lupus.  Prurigo nodularis - He gets injections by Dr. Delfina Redwood.  Chronic renal impairment, stage 3a (HCC) - GFR 45, UA showed 1+ protein.  Patient was evaluated by Dr. Ronalee Belts.  He has a follow-up visit in September.  We will request records from Dr. Dorena Cookey office.  Uncontrolled type 2 diabetes mellitus with hyperglycemia (HCC)-increased risk of heart disease with gout was also discussed.  Dietary modifications and exercise was emphasized.  Severe nonproliferative diabetic retinopathy of both eyes with macular edema associated with type 2 diabetes mellitus (HCC)  Nuclear sclerotic cataract of both eyes  Regular alcohol consumption - 3 times a week.  Abstinence from alcohol was discussed.  Orders: No orders of the defined types were placed in this encounter.  Meds ordered this encounter  Medications   febuxostat (ULORIC) 40 MG tablet    Sig: Take 1 tablet (40 mg total) by mouth daily. With food    Dispense:  30 tablet    Refill:  1   colchicine 0.6 MG tablet    Sig: Take 1 tablet (0.6 mg total) by mouth daily.    Dispense:  30 tablet    Refill:  1      Follow-Up Instructions: Return in about 2 months (around 08/06/2021) for Osteoarthritis,  Gout.   Pollyann Savoy, MD  Note - This record has been created using Animal nutritionist.  Chart creation errors have been sought, but may not always  have been located. Such creation errors do not reflect on  the standard of medical care.

## 2021-06-05 ENCOUNTER — Other Ambulatory Visit: Payer: Self-pay

## 2021-06-05 ENCOUNTER — Encounter: Payer: Self-pay | Admitting: Rheumatology

## 2021-06-05 ENCOUNTER — Ambulatory Visit: Payer: Medicare HMO | Admitting: Rheumatology

## 2021-06-05 VITALS — BP 126/76 | HR 59 | Resp 16 | Ht 74.0 in | Wt 210.4 lb

## 2021-06-05 DIAGNOSIS — R768 Other specified abnormal immunological findings in serum: Secondary | ICD-10-CM

## 2021-06-05 DIAGNOSIS — Z789 Other specified health status: Secondary | ICD-10-CM

## 2021-06-05 DIAGNOSIS — E113413 Type 2 diabetes mellitus with severe nonproliferative diabetic retinopathy with macular edema, bilateral: Secondary | ICD-10-CM

## 2021-06-05 DIAGNOSIS — M19012 Primary osteoarthritis, left shoulder: Secondary | ICD-10-CM

## 2021-06-05 DIAGNOSIS — E790 Hyperuricemia without signs of inflammatory arthritis and tophaceous disease: Secondary | ICD-10-CM

## 2021-06-05 DIAGNOSIS — M19041 Primary osteoarthritis, right hand: Secondary | ICD-10-CM

## 2021-06-05 DIAGNOSIS — L281 Prurigo nodularis: Secondary | ICD-10-CM

## 2021-06-05 DIAGNOSIS — N1831 Chronic kidney disease, stage 3a: Secondary | ICD-10-CM

## 2021-06-05 DIAGNOSIS — M19011 Primary osteoarthritis, right shoulder: Secondary | ICD-10-CM | POA: Diagnosis not present

## 2021-06-05 DIAGNOSIS — M19072 Primary osteoarthritis, left ankle and foot: Secondary | ICD-10-CM

## 2021-06-05 DIAGNOSIS — M5030 Other cervical disc degeneration, unspecified cervical region: Secondary | ICD-10-CM

## 2021-06-05 DIAGNOSIS — M720 Palmar fascial fibromatosis [Dupuytren]: Secondary | ICD-10-CM

## 2021-06-05 DIAGNOSIS — M1A09X0 Idiopathic chronic gout, multiple sites, without tophus (tophi): Secondary | ICD-10-CM | POA: Diagnosis not present

## 2021-06-05 DIAGNOSIS — E1165 Type 2 diabetes mellitus with hyperglycemia: Secondary | ICD-10-CM

## 2021-06-05 DIAGNOSIS — M19071 Primary osteoarthritis, right ankle and foot: Secondary | ICD-10-CM

## 2021-06-05 DIAGNOSIS — H2513 Age-related nuclear cataract, bilateral: Secondary | ICD-10-CM

## 2021-06-05 DIAGNOSIS — M19042 Primary osteoarthritis, left hand: Secondary | ICD-10-CM

## 2021-06-05 MED ORDER — COLCHICINE 0.6 MG PO TABS: 0.6000 mg | ORAL_TABLET | Freq: Every day | ORAL | 1 refills | Status: DC

## 2021-06-05 MED ORDER — FEBUXOSTAT 40 MG PO TABS: 40.0000 mg | ORAL_TABLET | Freq: Every day | ORAL | 1 refills | Status: DC

## 2021-06-05 NOTE — Patient Instructions (Addendum)
Start Uloric 40mg  once daily. Continue colchicine 0.6mg  once daily.  We will recheck labs in 1 month  Standing Labs We placed an order today for your standing lab work.   Please have your standing labs drawn in 1 month then every 3 months  If possible, please have your labs drawn 2 weeks prior to your appointment so that the provider can discuss your results at your appointment.  Please note that you may see your imaging and lab results in MyChart before we have reviewed them. We may be awaiting multiple results to interpret others before contacting you. Please allow our office up to 72 hours to thoroughly review all of the results before contacting the office for clarification of your results.  We have open lab daily: Monday through Thursday from 1:30-4:30 PM and Friday from 1:30-4:00 PM at the office of Dr. Sunday, [facility].   Please be advised, all patients with office appointments requiring lab work will take precedent over walk-in lab work.  If possible, please come for your lab work on Monday and Friday afternoons, as you may experience shorter wait times. The office is located at [address], [place], [place] No appointment is necessary.   Labs are drawn by [facility]. Please bring your co-pay at the time of your lab draw.  You may receive a bill from [facility] for your lab work.  If you wish to have your labs drawn at another location, please call the office 24 hours in advance to send orders.  If you have any questions regarding directions or hours of operation,  please call [phone].   As a reminder, please drink plenty of water prior to coming for your lab work. Thanks!   Febuxostat oral tablets What is this medication? FEBUXOSTAT (feb UX oh stat) is used to treat gout. People with gout have too much uric acid in their body. This medicine works to lower how much uric acidthe body makes. This medicine may be used for other  purposes; ask your health care provider orpharmacist if you have questions. COMMON BRAND NAME(S): Uloric What should I tell my care team before I take this medication? They need to know if you have any of these conditions: heart disease history of heart attack or stroke kidney disease liver disease taking azathioprine or mercaptopurine an unusual or allergic reaction to febuxostat, other medicines, foods, dyes, or preservatives pregnant or trying to get pregnant breast feeding How should I use this medication? Take this medicine by mouth with a glass of water. Follow the directions on the prescription label. You can take it with or without food. Take your medicine at regular intervals. Do not take it more often than directed. Do not stop takingexcept on your doctor's advice. Talk to your pediatrician regarding the use of this medicine in children. Thismedicine is not approved for use in children. Overdosage: If you think you have taken too much of this medicine contact apoison control center or emergency room at once. NOTE: This medicine is only for you. Do not share this medicine with others. What if I miss a dose? If you miss a dose, take it as soon as you can. If it is almost time for yournext dose, take only that dose. Do not take double or extra doses. What may interact with this medication? Do not take this medicine with any of the following medications: azathioprine mercaptopurine This medicine may also interact with the following medications: aminophylline certain medicines used to  treat cancer pegloticase rasburicase theophylline This list may not describe all possible interactions. Give your health care provider a list of all the medicines, herbs, non-prescription drugs, or dietary supplements you use. Also tell them if you smoke, drink alcohol, or use illegaldrugs. Some items may interact with your medicine. What should I watch for while using this medication? Visit your  healthcare provider for regular checks on your progress. Tell your healthcare professional if your symptoms do not start to get better or if theyget worse. You will need regular blood tests while you are taking this medicine. Your gout may flare up when you are first taking this medicine. Do not stop taking this medicine, even if you have a flare. Your doctor or healthcareprovider may give you another medicine to help treat the gout pain. This medicine may cause serious skin reactions. They can happen weeks to months after starting the medicine. Contact your healthcare provider right away if you notice fevers or flu-like symptoms with a rash. The rash may be red or purple and then turn into blisters or peeling of the skin. Or, you might notice a red rash with swelling of the face, lips, or lymph nodes in your neck or under yourarms. What side effects may I notice from receiving this medication? Side effects that you should report to your doctor or health care professionalas soon as possible: allergic reactions like skin rash, itching or hives, swelling of the face, lips, or tongue breathing problems chest pain or chest tightness fast, irregular heartbeat feeling faint or lightheaded rash, fever, and swollen lymph nodes redness, blistering, peeling, or loosening of the skin, including inside the mouth signs and symptoms of liver injury like dark yellow or brown urine; general ill feeling or flu-like symptoms; light-colored stools; loss of appetite; nausea; right upper belly pain; unusually weak or tired; yellowing of the eyes or skin signs and symptoms of a stroke like changes in vision; confusion; trouble speaking or understanding; severe headaches; sudden numbness or weakness of the face, arm or leg; trouble walking; dizziness; loss of balance or coordination Side effects that usually do not require medical attention (report to yourdoctor or health care professional if they continue or are  bothersome): changes in appetite joint pain nausea, vomiting upset stomach This list may not describe all possible side effects. Call your doctor for medical advice about side effects. You may report side effects to FDA at1-800-FDA-1088. Where should I keep my medication? Keep out of the reach of children. Store at room temperature between 15 and 30 degrees C (59 and 86 degrees F). Protect from light and moisture. Throw away any unused medicine after theexpiration date. NOTE: This sheet is a summary. It may not cover all possible information. If you have questions about this medicine, talk to your doctor, pharmacist, orhealth care provider.  2022 Elsevier/Gold Standard (2019-02-03 14:09:48)

## 2021-06-05 NOTE — Progress Notes (Signed)
Patient seen today by Dr. Corliss Skains for gout management. He has PMH of CKD (seen by [facility]), RA, T2DM complicated by retinopathy  For his gout, he takes colchicine as needed for flare. He previously tried allopurinol but was unable to tolerate. Per chart note from nephrologist on 06/03/21, patient was taking febuxostat prior to January 2022 but at OV on 11/22/20, he stopped febuxostat. Patient unable to recall why he stopped medication and nephrology note also does not indicate why. After discussing risks and benefits of febuxostat, he was hesistant d/t warning of CV events and stroke d/t family history of cardiac events and personal history of T2DM. Patient wanted to re-try allopurinol instead. However after discussion regarding allopurinol vs. febuxostat and preference to use febuxostat d/t his renal function - he states he is okay with re-starting febuxostat per clinical recommendations.  Counseled patient on the purpose proper use, and adverse effects of febuxostat and colchicine.  Discussed the importance of taking febuxostat every day to lower uric acid levels.  Advised him to take febuxostat with food since he was previously unable to tolerate allopurinol.  The possibility of recurrent gout while lowering the uric acid was explained and discussed the importance of taking colchicine daily at this time until labs are rechecked.  Provided patient with medication education material and answered all questions.    Plan: - Start febuxostat 40mg  once daily - Continue colchicine 0.6mg  once daily - Recheck uric acid and CMP in 1 month.

## 2021-06-06 ENCOUNTER — Encounter (INDEPENDENT_AMBULATORY_CARE_PROVIDER_SITE_OTHER): Payer: Self-pay | Admitting: Ophthalmology

## 2021-06-06 ENCOUNTER — Ambulatory Visit (INDEPENDENT_AMBULATORY_CARE_PROVIDER_SITE_OTHER): Payer: Medicare HMO | Admitting: Ophthalmology

## 2021-06-06 DIAGNOSIS — E113412 Type 2 diabetes mellitus with severe nonproliferative diabetic retinopathy with macular edema, left eye: Secondary | ICD-10-CM

## 2021-06-06 NOTE — Progress Notes (Signed)
06/06/2021     CHIEF COMPLAINT Patient presents for Retina Evaluation   HISTORY OF PRESENT ILLNESS: Steven Knapp is a 75 y.o. male who presents to the clinic today for:   HPI     Retina Evaluation           Laterality: left eye         Comments   OS some 3 weeks post recent injection of vitreal Avastin for CSME associated with severe NPDR.  Small residual area temporal to the fovea with thickening in the past.  OCT now and plan focal laser treatment to the residual thickening in this area      Last edited by Cecilie Heidel, Clent Demark, MD on 06/06/2021  3:40 PM.      Referring physician: Horald Pollen, MD [place],  [place] [id number]  HISTORICAL INFORMATION:   Selected notes from the MEDICAL RECORD NUMBER    Lab Results  Component Value Date   HGBA1C 7.3 (A) 01/28/2021     CURRENT MEDICATIONS: Current Outpatient Medications (Ophthalmic Drugs)  Medication Sig   BEPREVE 1.5 % SOLN 1 drop 2 (two) times daily.   SYSTANE ULTRA 0.4-0.3 % SOLN SMARTSIG:1 Drop(s) In Eye(s) As Needed   No current facility-administered medications for this visit. (Ophthalmic Drugs)   Current Outpatient Medications (Other)  Medication Sig   blood glucose meter kit and supplies Dispense based on patient and insurance preference. Use up to four times daily as directed. (FOR ICD-10 E10.9, E11.9).   clobetasol cream (TEMOVATE) 6.06 % Apply 1 application topically 2 (two) times daily.   colchicine 0.6 MG tablet Take 1 tablet (0.6 mg total) by mouth daily.   Continuous Blood Gluc Sensor (FREESTYLE LIBRE SENSOR SYSTEM) MISC 1 Device by Does not apply route daily.   febuxostat (ULORIC) 40 MG tablet Take 1 tablet (40 mg total) by mouth daily. With food   glipiZIDE (GLUCOTROL) 5 MG tablet Take 1 tablet (5 mg total) by mouth 2 (two) times daily before a meal.   ibuprofen (ADVIL) 200 MG tablet Take 200 mg by mouth every 6 (six) hours as needed for moderate pain.   Lactobacillus  (PROBIOTIC ACIDOPHILUS PO) Take 1 capsule by mouth as needed.   Multiple Vitamins-Minerals (ZINC PO) Take by mouth daily.   rosuvastatin (CRESTOR) 10 MG tablet Take 1 tablet (10 mg total) by mouth daily.   sildenafil (VIAGRA) 100 MG tablet TAKE ONE-HALF TO ONE TABLET BY MOUTH DAILY AS NEEDED FOR ERECTILE DYSFUNCTION   No current facility-administered medications for this visit. (Other)      REVIEW OF SYSTEMS:    ALLERGIES Allergies  Allergen Reactions   Other Hives and Itching    Peanuts, strawberries, pickles, wine, and corn   Shellfish Allergy     PAST MEDICAL HISTORY Past Medical History:  Diagnosis Date   Arthritis    Cataract    Diabetes mellitus without complication ([facility])    Spinal stenosis    per patient   No past surgical history on file.  FAMILY HISTORY Family History  Problem Relation Age of Onset   Diabetes Mother    Hypertension Mother    Diabetes Brother    Healthy Brother    Healthy Daughter    Healthy Daughter    Healthy Son    Healthy Son    Scoliosis Son     SOCIAL HISTORY Social History   Tobacco Use   Smoking status: Some Days    Years: 4.00  Types: Cigarettes   Smokeless tobacco: Never  Vaping Use   Vaping Use: Never used  Substance Use Topics   Alcohol use: Yes    Alcohol/week: 3.0 standard drinks    Types: 3 Standard drinks or equivalent per week    Comment: 2-3 times weekly   Drug use: Never         OPHTHALMIC EXAM:  Base Eye Exam     Visual Acuity (ETDRS)       Right Left   Dist [place] 20/20 20/25         Tonometry (Palpation, 3:37 PM)       Right Left   Pressure 15 15         Neuro/Psych     Oriented x3: Yes   Mood/Affect: Normal         Dilation     Left eye: 1.0% Mydriacyl, 2.5% Phenylephrine @ 3:38 PM           Slit Lamp and Fundus Exam     External Exam       Right Left   External Normal Normal         Slit Lamp Exam       Right Left   Lids/Lashes Normal Normal    Conjunctiva/Sclera White and quiet White and quiet   Cornea Clear Clear   Anterior Chamber Deep and quiet Deep and quiet   Iris Round and reactive Round and reactive   Lens 2+ Nuclear sclerosis 2+ Nuclear sclerosis   Anterior Vitreous Normal Normal         Fundus Exam       Right Left   Posterior Vitreous  Posterior vitreous detachment, Central vitreous floaters   Disc  Normal   C/D Ratio  0.5   Macula  Mild clinically significant macular edema, Exudates, Microaneurysms   Vessels  NPDR severe   Periphery  Normal            IMAGING AND PROCEDURES  Imaging and Procedures for 06/06/21  OCT, Retina - OU - Both Eyes       Right Eye Quality was borderline. Scan locations included subfoveal. Central Foveal Thickness: 289. Progression has been stable. Findings include abnormal foveal contour.   Left Eye Quality was borderline. Scan locations included subfoveal. Central Foveal Thickness: 279. Progression has been stable. Findings include abnormal foveal contour, cystoid macular edema.   Notes Area superior to the fovea OD, resolved post recent focal  OS with prior center involved CSME, slightly improved again at 3-week interval, plan focal laser treatment temporal to fovea OS today     Focal Laser - OS - Left Eye       Time Out Confirmed correct patient, procedure, site, and patient consented.   Anesthesia Topical anesthesia was used. Anesthetic medications included Proparacaine 0.5%.   Laser Information The type of laser was diode. Color was yellow. The duration in seconds was 0.1. The spot size was 100 microns. Laser power was 50. Total spots was 22.   Post-op The patient tolerated the procedure well. There were no complications. The patient received written and verbal post procedure care education.   Notes Noncontact focal laser delivered today, navigated laser via NAVILAS OCT guided             ASSESSMENT/PLAN:  Severe nonproliferative diabetic  retinopathy of left eye, with macular edema, associated with type 2 diabetes mellitus (HCC) CSME temporal to fovea.  Status post recent injection intravitreal Avastin.  Focal laser great  treatment delivered today to decrease the treatment burden for this noncentral involved CSME      ICD-10-CM   1. Severe nonproliferative diabetic retinopathy of left eye, with macular edema, associated with type 2 diabetes mellitus (HCC)  V13.6859 OCT, Retina - OU - Both Eyes    Focal Laser - OS - Left Eye      1.  Focal laser delivered atraumatically today temporal to the fovea.  OCT guided on NAVILAS noncontact focal laser system  2.  3.  Ophthalmic Meds Ordered this visit:  No orders of the defined types were placed in this encounter.      Return in about 4 months (around 10/07/2021) for DILATE OU, OCT.  There are no Patient Instructions on file for this visit.   Explained the diagnoses, plan, and follow up with the patient and they expressed understanding.  Patient expressed understanding of the importance of proper follow up care.   Clent Demark Finbar Nippert M.D. Diseases & Surgery of the Retina and Vitreous [facility] [place] 06/06/21     Abbreviations: M myopia (nearsighted); A astigmatism; H hyperopia (farsighted); P presbyopia; Mrx spectacle prescription;  CTL contact lenses; OD right eye; OS left eye; OU both eyes  XT exotropia; ET esotropia; PEK punctate epithelial keratitis; PEE punctate epithelial erosions; DES dry eye syndrome; MGD meibomian gland dysfunction; ATs artificial tears; PFAT's preservative free artificial tears; [place] nuclear sclerotic cataract; PSC posterior subcapsular cataract; ERM epi-retinal membrane; PVD posterior vitreous detachment; RD retinal detachment; DM diabetes mellitus; DR diabetic retinopathy; NPDR non-proliferative diabetic retinopathy; PDR proliferative diabetic retinopathy; CSME clinically significant macular edema; DME diabetic macular edema; dbh dot  blot hemorrhages; CWS cotton wool spot; POAG primary open angle glaucoma; C/D cup-to-disc ratio; HVF humphrey visual field; GVF goldmann visual field; OCT optical coherence tomography; IOP intraocular pressure; BRVO Branch retinal vein occlusion; CRVO central retinal vein occlusion; CRAO central retinal artery occlusion; BRAO branch retinal artery occlusion; RT retinal tear; SB scleral buckle; PPV pars plana vitrectomy; VH Vitreous hemorrhage; PRP panretinal laser photocoagulation; IVK intravitreal kenalog; VMT vitreomacular traction; MH Macular hole;  NVD neovascularization of the disc; NVE neovascularization elsewhere; AREDS age related eye disease study; ARMD age related macular degeneration; POAG primary open angle glaucoma; EBMD epithelial/anterior basement membrane dystrophy; ACIOL anterior chamber intraocular lens; IOL intraocular lens; PCIOL posterior chamber intraocular lens; Phaco/IOL phacoemulsification with intraocular lens placement; [place] photorefractive keratectomy; LASIK laser assisted in situ keratomileusis; HTN hypertension; DM diabetes mellitus; COPD chronic obstructive pulmonary disease

## 2021-06-06 NOTE — Assessment & Plan Note (Signed)
CSME temporal to fovea.  Status post recent injection intravitreal Avastin.  Focal laser great treatment delivered today to decrease the treatment burden for this noncentral involved CSME

## 2021-06-10 ENCOUNTER — Encounter (INDEPENDENT_AMBULATORY_CARE_PROVIDER_SITE_OTHER): Payer: Medicare HMO | Admitting: Ophthalmology

## 2021-06-17 ENCOUNTER — Other Ambulatory Visit: Payer: Self-pay | Admitting: Emergency Medicine

## 2021-07-23 NOTE — Progress Notes (Signed)
Office Visit Note  Patient: Steven Knapp             Date of Birth: Jun 21, 1946           MRN: [id number]             PCP: Georgina Quint, MD Referring: Georgina Quint, * Visit Date: 08/06/2021 Occupation: @  Subjective:  Medication monitoring   History of Present Illness: Steven Knapp is a 75 y.o. male with history of gout and osteoarthritis. He is taking uloric 40 mg daily and colchicine 0.6 mg 1 tablet by mouth daily as needed.  He continues to tolerate Uloric without any side effects.  He denies any gout flares since his last office visit.  He states that he experiences intermittent stiffness in both shoulder joints especially his right shoulder.  He has occasional discomfort at night when lying on his sides.  He denies any injury to his shoulder in the past.  He denies any other joint pain or joint swelling at this time.  He denies any new concerns. He plans on following up with his nephrologist November 2022 and will continue to see his dermatologist on a regular basis.    Activities of Daily Living:  Patient reports morning stiffness for 30 minutes.   Patient Denies nocturnal pain.  Difficulty dressing/grooming: Denies Difficulty climbing stairs: Denies Difficulty getting out of chair: Denies Difficulty using hands for taps, buttons, cutlery, and/or writing: Denies  Review of Systems  Constitutional:  Negative for fatigue.  HENT:  Negative for mouth sores, mouth dryness and nose dryness.   Eyes:  Positive for dryness. Negative for pain and itching.  Respiratory:  Negative for shortness of breath and difficulty breathing.   Cardiovascular:  Negative for chest pain and palpitations.  Gastrointestinal:  Negative for blood in stool, constipation and diarrhea.  Endocrine: Negative for increased urination.  Genitourinary:  Negative for difficulty urinating.  Musculoskeletal:  Positive for joint pain, joint pain, joint swelling and morning stiffness. Negative  for myalgias, muscle tenderness and myalgias.  Skin:  Positive for rash. Negative for color change.  Allergic/Immunologic: Negative for susceptible to infections.  Neurological:  Negative for dizziness, numbness, headaches, memory loss and weakness.  Hematological:  Negative for bruising/bleeding tendency.  Psychiatric/Behavioral:  Negative for confusion.    PMFS History:  Patient Active Problem List   Diagnosis Date Noted   Primary osteoarthritis of both shoulders 05/24/2021   Primary osteoarthritis of both hands 05/24/2021   Primary osteoarthritis of both feet 05/24/2021   DDD (degenerative disc disease), cervical 05/24/2021   Prurigo nodularis 05/24/2021   Chronic renal impairment, stage 3a (HCC) 05/24/2021   Uncontrolled type 2 diabetes mellitus with hyperglycemia (HCC) 07/30/2020   Severe nonproliferative diabetic retinopathy of left eye, with macular edema, associated with type 2 diabetes mellitus (HCC) 02/21/2020   Severe nonproliferative diabetic retinopathy of right eye, with macular edema, associated with type 2 diabetes mellitus (HCC) 02/21/2020   Nuclear sclerotic cataract of both eyes 02/21/2020   Retinal hemorrhage of left eye 02/21/2020   Retinal exudates and deposits 02/21/2020   Alternating exotropia 02/21/2020   Diabetes mellitus (HCC) 10/06/2019   Spinal stenosis 10/06/2019   History of gout 10/06/2019   History of rheumatoid arthritis 10/06/2019   Osteoarthritis of wrist 11/05/2018   Gout 09/16/2018    Past Medical History:  Diagnosis Date   Arthritis    Cataract    Diabetes mellitus without complication (HCC)    Spinal stenosis  per patient    Family History  Problem Relation Age of Onset   Diabetes Mother    Hypertension Mother    Diabetes Brother    Soil scientist Daughter    Healthy Daughter    Healthy Son    Healthy Son    Scoliosis Son    History reviewed. No pertinent surgical history. Social History   Social History  Narrative   Not on file   Immunization History  Administered Date(s) Administered   Fluad Quad(high Dose 65+) 07/30/2020   Moderna Sars-Covid-2 Vaccination 11/28/2019, 12/29/2019, 07/18/2020   Pneumococcal Polysaccharide-23 10/06/2019     Objective: Vital Signs: BP 122/75 (BP Location: Left Arm, Patient Position: Sitting, Cuff Size: Normal)   Pulse (!) 59   Ht 6\' 3"  (1.905 m)   Wt 213 lb (96.6 kg)   BMI 26.62 kg/m    Physical Exam Vitals and nursing note reviewed.  Constitutional:      Appearance: He is well-developed.  HENT:     Head: Normocephalic and atraumatic.  Eyes:     Conjunctiva/sclera: Conjunctivae normal.     Pupils: Pupils are equal, round, and reactive to light.  Pulmonary:     Effort: Pulmonary effort is normal.  Abdominal:     Palpations: Abdomen is soft.  Musculoskeletal:     Cervical back: Normal range of motion and neck supple.  Skin:    General: Skin is warm and dry.     Capillary Refill: Capillary refill takes less than 2 seconds.  Neurological:     Mental Status: He is alert and oriented to person, place, and time.  Psychiatric:        Behavior: Behavior normal.     Musculoskeletal Exam: C-spine has good range of motion with no discomfort.  Shoulder joints have good range of motion with some stiffness in the right shoulder.  Contracture of the right elbow joint noted.  Wrist joints have good range of motion with no tenderness or inflammation.  Incomplete fist formation bilaterally.  PIP and DIP thickening consistent with osteoarthritis of both hands noted.  Hip joints have good range of motion with no discomfort.  Knee joints have good range of motion with no warmth or effusion.  Ankle joints have good range of motion with no tenderness or joint swelling.  CDAI Exam: CDAI Score: -- Patient Global: --; Provider Global: -- Swollen: --; Tender: -- Joint Exam 08/06/2021   No joint exam has been documented for this visit   There is currently no  information documented on the homunculus. Go to the Rheumatology activity and complete the homunculus joint exam.  Investigation: No additional findings.  Imaging: No results found.  Recent Labs: Lab Results  Component Value Date   WBC 6.4 07/30/2020   HGB 11.8 (L) 07/30/2020   PLT 261 07/30/2020   NA 139 04/30/2021   K 4.6 04/30/2021   CL 106 04/30/2021   CO2 25 04/30/2021   GLUCOSE 134 (H) 04/30/2021   BUN 33 (H) 04/30/2021   CREATININE 1.67 (H) 04/30/2021   BILITOT 0.6 04/30/2021   ALKPHOS 65 01/28/2021   AST 25 04/30/2021   ALT 20 04/30/2021   PROT 7.4 04/30/2021   PROT 7.5 04/30/2021   ALBUMIN 4.0 01/28/2021   CALCIUM 10.2 04/30/2021   GFRAA 45 (L) 04/30/2021    Speciality Comments: Allopurinol-nausea  Procedures:  No procedures performed Allergies: Other and Shellfish allergy   Assessment / Plan:     Visit Diagnoses: Idiopathic  chronic gout of multiple sites without tophus - He has not had any signs or symptoms of a gout flare since his last office visit.  He has clinically been doing well taking Uloric 40 mg 1 tablet by mouth daily.  He has been taking colchicine 0.6 mg 1 tablet by mouth daily as needed but reports only taking colchicine about once a week recently.  His uric acid was 8.9 on 02/28/2021.  Discussed that ideally his uric acid level should be less than 6.  We will recheck uric acid today while he is in the office.  He will continue to follow-up with his nephrologist on a regular basis and has an upcoming appointment in November.  We will forward lab work obtained today to his nephrologist for further review as well.  He will continue to take Uloric on a daily basis as prescribed and colchicine 0.6 mg 1 tablet by mouth daily as needed during flares.  He was advised to notify us if he develops signs or symptoms of a flare.  He will follow up in 4-5 months.  Plan: COMPLETE METABOLIC PANEL WITH GFR, CBC with Differential/Platelet, Uric acid  Hyperuricemia -  February 28, 2021 uric acid 8.9.  His uric acid level will be checked today.  He will remain on Uloric 40 mg 1 tablet by mouth daily.- Plan: Uric acid  Primary osteoarthritis of both shoulders - H/o pain in bilateral shoulders.  X-rays at the last visit showed bilateral glenohumeral and acromioclavicular joint arthritis and chondrocalcinosis LSJ.  He has good range of motion of both shoulder joints with some discomfort and stiffness in his right shoulder.  He experiences occasional pain and stiffness in both shoulders especially with range of motion or when lying on his sides at night.  We discussed the importance of performing range of motion exercises as well as strengthening exercises.  He was given a handout of exercises to perform.  He was advised to notify us if his discomfort persists or worsens.  Primary osteoarthritis of both hands: He has PIP and DIP thickening consistent with osteoarthritis of both hands.  Incomplete fist formation bilaterally.  No tenderness or synovitis noted. Discussed the importance of joint protection and muscle strengthening.   Dupuytren's contracture of left hand: Unchanged.   Primary osteoarthritis of both feet - X-ray showed erosive changes and osteoarthritis.  He is not experiencing any discomfort in his feet at this time.  He has good ROM of both ankle joints with no tenderness or joint swelling.    DDD (degenerative disc disease), cervical: He has good ROM with no discomfort at this time.  No symptoms of radiculopathy.   Positive ANA (antinuclear antibody) - Double-stranded DNA negative, Smith negative, anti-CCP negative.  He has no clinical features of lupus.  Prurigo nodularis - He gets injections by Dr. Delfina Redwood.He plans to continue to follow up with dermatology closely.   Chronic renal impairment, stage 3a (HCC) - GFR 45, UA showed 1+ protein.  Patient was evaluated by Dr. Ronalee Belts.  His next appointment is in November 2022.  CMP with GFR ordered today and will  be forwarded to Dr. Ronalee Belts.   Other medical conditions are listed as follows:   Uncontrolled type 2 diabetes mellitus with hyperglycemia (HCC)  Severe nonproliferative diabetic retinopathy of both eyes with macular edema associated with type 2 diabetes mellitus (HCC)  Nuclear sclerotic cataract of both eyes  Regular alcohol consumption  Orders: Orders Placed This Encounter  Procedures   COMPLETE METABOLIC PANEL  WITH GFR   CBC with Differential/Platelet   Uric acid   No orders of the defined types were placed in this encounter.    Follow-Up Instructions: Return in about 5 months (around 01/06/2022) for Gout, Osteoarthritis.   Gearldine Bienenstock, PA-C  Note - This record has been created using Dragon software.  Chart creation errors have been sought, but may not always  have been located. Such creation errors do not reflect on  the standard of medical care.

## 2021-08-06 ENCOUNTER — Other Ambulatory Visit: Payer: Self-pay

## 2021-08-06 ENCOUNTER — Encounter: Payer: Self-pay | Admitting: Physician Assistant

## 2021-08-06 ENCOUNTER — Ambulatory Visit: Payer: Medicare HMO | Admitting: Physician Assistant

## 2021-08-06 VITALS — BP 122/75 | HR 59 | Ht 75.0 in | Wt 213.0 lb

## 2021-08-06 DIAGNOSIS — M19011 Primary osteoarthritis, right shoulder: Secondary | ICD-10-CM

## 2021-08-06 DIAGNOSIS — M19072 Primary osteoarthritis, left ankle and foot: Secondary | ICD-10-CM

## 2021-08-06 DIAGNOSIS — H2513 Age-related nuclear cataract, bilateral: Secondary | ICD-10-CM

## 2021-08-06 DIAGNOSIS — N1831 Chronic kidney disease, stage 3a: Secondary | ICD-10-CM

## 2021-08-06 DIAGNOSIS — Z789 Other specified health status: Secondary | ICD-10-CM

## 2021-08-06 DIAGNOSIS — E113413 Type 2 diabetes mellitus with severe nonproliferative diabetic retinopathy with macular edema, bilateral: Secondary | ICD-10-CM

## 2021-08-06 DIAGNOSIS — M1A09X0 Idiopathic chronic gout, multiple sites, without tophus (tophi): Secondary | ICD-10-CM | POA: Diagnosis not present

## 2021-08-06 DIAGNOSIS — E790 Hyperuricemia without signs of inflammatory arthritis and tophaceous disease: Secondary | ICD-10-CM

## 2021-08-06 DIAGNOSIS — M19041 Primary osteoarthritis, right hand: Secondary | ICD-10-CM | POA: Diagnosis not present

## 2021-08-06 DIAGNOSIS — E1165 Type 2 diabetes mellitus with hyperglycemia: Secondary | ICD-10-CM

## 2021-08-06 DIAGNOSIS — L281 Prurigo nodularis: Secondary | ICD-10-CM

## 2021-08-06 DIAGNOSIS — M720 Palmar fascial fibromatosis [Dupuytren]: Secondary | ICD-10-CM

## 2021-08-06 DIAGNOSIS — M19012 Primary osteoarthritis, left shoulder: Secondary | ICD-10-CM

## 2021-08-06 DIAGNOSIS — M5030 Other cervical disc degeneration, unspecified cervical region: Secondary | ICD-10-CM

## 2021-08-06 DIAGNOSIS — M19071 Primary osteoarthritis, right ankle and foot: Secondary | ICD-10-CM

## 2021-08-06 DIAGNOSIS — R768 Other specified abnormal immunological findings in serum: Secondary | ICD-10-CM

## 2021-08-06 DIAGNOSIS — M19042 Primary osteoarthritis, left hand: Secondary | ICD-10-CM

## 2021-08-06 NOTE — Patient Instructions (Signed)
Shoulder Exercises Ask your health care provider which exercises are safe for you. Do exercises exactly as told by your health care provider and adjust them as directed. It is normal to feel mild stretching, pulling, tightness, or discomfort as you do these exercises. Stop right away if you feel sudden pain or your pain gets worse. Do not begin these exercises until told by your health care provider. Stretching exercises External rotation and abduction This exercise is sometimes called corner stretch. This exercise rotates your arm outward (external rotation) and moves your arm out from your body (abduction). Stand in a doorway with one of your feet slightly in front of the other. This is called a staggered stance. If you cannot reach your forearms to the door frame, stand facing a corner of a room. Choose one of the following positions as told by your health care provider: Place your hands and forearms on the door frame above your head. Place your hands and forearms on the door frame at the height of your head. Place your hands on the door frame at the height of your elbows. Slowly move your weight onto your front foot until you feel a stretch across your chest and in the front of your shoulders. Keep your head and chest upright and keep your abdominal muscles tight. Hold for __________ seconds. To release the stretch, shift your weight to your back foot. Repeat __________ times. Complete this exercise __________ times a day. Extension, standing Stand and hold a broomstick, a cane, or a similar object behind your back. Your hands should be a little wider than shoulder width apart. Your palms should face away from your back. Keeping your elbows straight and your shoulder muscles relaxed, move the stick away from your body until you feel a stretch in your shoulders (extension). Avoid shrugging your shoulders while you move the stick. Keep your shoulder blades tucked down toward the middle of your  back. Hold for __________ seconds. Slowly return to the starting position. Repeat __________ times. Complete this exercise __________ times a day. Range-of-motion exercises Pendulum  Stand near a wall or a surface that you can hold onto for balance. Bend at the waist and let your left / right arm hang straight down. Use your other arm to support you. Keep your back straight and do not lock your knees. Relax your left / right arm and shoulder muscles, and move your hips and your trunk so your left / right arm swings freely. Your arm should swing because of the motion of your body, not because you are using your arm or shoulder muscles. Keep moving your hips and trunk so your arm swings in the following directions, as told by your health care provider: Side to side. Forward and backward. In clockwise and counterclockwise circles. Continue each motion for __________ seconds, or for as long as told by your health care provider. Slowly return to the starting position. Repeat __________ times. Complete this exercise __________ times a day. Shoulder flexion, standing  Stand and hold a broomstick, a cane, or a similar object. Place your hands a little more than shoulder width apart on the object. Your left / right hand should be palm up, and your other hand should be palm down. Keep your elbow straight and your shoulder muscles relaxed. Push the stick up with your healthy arm to raise your left / right arm in front of your body, and then over your head until you feel a stretch in your shoulder (flexion). Avoid   shrugging your shoulder while you raise your arm. Keep your shoulder blade tucked down toward the middle of your back. Hold for __________ seconds. Slowly return to the starting position. Repeat __________ times. Complete this exercise __________ times a day. Shoulder abduction, standing Stand and hold a broomstick, a cane, or a similar object. Place your hands a little more than shoulder  width apart on the object. Your left / right hand should be palm up, and your other hand should be palm down. Keep your elbow straight and your shoulder muscles relaxed. Push the object across your body toward your left / right side. Raise your left / right arm to the side of your body (abduction) until you feel a stretch in your shoulder. Do not raise your arm above shoulder height unless your health care provider tells you to do that. If directed, raise your arm over your head. Avoid shrugging your shoulder while you raise your arm. Keep your shoulder blade tucked down toward the middle of your back. Hold for __________ seconds. Slowly return to the starting position. Repeat __________ times. Complete this exercise __________ times a day. Internal rotation  Place your left / right hand behind your back, palm up. Use your other hand to dangle an exercise band, a towel, or a similar object over your shoulder. Grasp the band with your left / right hand so you are holding on to both ends. Gently pull up on the band until you feel a stretch in the front of your left / right shoulder. The movement of your arm toward the center of your body is called internal rotation. Avoid shrugging your shoulder while you raise your arm. Keep your shoulder blade tucked down toward the middle of your back. Hold for __________ seconds. Release the stretch by letting go of the band and lowering your hands. Repeat __________ times. Complete this exercise __________ times a day. Strengthening exercises External rotation  Sit in a stable chair without armrests. Secure an exercise band to a stable object at elbow height on your left / right side. Place a soft object, such as a folded towel or a small pillow, between your left / right upper arm and your body to move your elbow about 4 inches (10 cm) away from your side. Hold the end of the exercise band so it is tight and there is no slack. Keeping your elbow pressed  against the soft object, slowly move your forearm out, away from your abdomen (external rotation). Keep your body steady so only your forearm moves. Hold for __________ seconds. Slowly return to the starting position. Repeat __________ times. Complete this exercise __________ times a day. Shoulder abduction  Sit in a stable chair without armrests, or stand up. Hold a __________ weight in your left / right hand, or hold an exercise band with both hands. Start with your arms straight down and your left / right palm facing in, toward your body. Slowly lift your left / right hand out to your side (abduction). Do not lift your hand above shoulder height unless your health care provider tells you that this is safe. Keep your arms straight. Avoid shrugging your shoulder while you do this movement. Keep your shoulder blade tucked down toward the middle of your back. Hold for __________ seconds. Slowly lower your arm, and return to the starting position. Repeat __________ times. Complete this exercise __________ times a day. Shoulder extension Sit in a stable chair without armrests, or stand up. Secure an exercise band   to a stable object in front of you so it is at shoulder height. Hold one end of the exercise band in each hand. Your palms should face each other. Straighten your elbows and lift your hands up to shoulder height. Step back, away from the secured end of the exercise band, until the band is tight and there is no slack. Squeeze your shoulder blades together as you pull your hands down to the sides of your thighs (extension). Stop when your hands are straight down by your sides. Do not let your hands go behind your body. Hold for __________ seconds. Slowly return to the starting position. Repeat __________ times. Complete this exercise __________ times a day. Shoulder row Sit in a stable chair without armrests, or stand up. Secure an exercise band to a stable object in front of you so it  is at waist height. Hold one end of the exercise band in each hand. Position your palms so that your thumbs are facing the ceiling (neutral position). Bend each of your elbows to a 90-degree angle (right angle) and keep your upper arms at your sides. Step back until the band is tight and there is no slack. Slowly pull your elbows back behind you. Hold for __________ seconds. Slowly return to the starting position. Repeat __________ times. Complete this exercise __________ times a day. Shoulder press-ups  Sit in a stable chair that has armrests. Sit upright, with your feet flat on the floor. Put your hands on the armrests so your elbows are bent and your fingers are pointing forward. Your hands should be about even with the sides of your body. Push down on the armrests and use your arms to lift yourself off the chair. Straighten your elbows and lift yourself up as much as you comfortably can. Move your shoulder blades down, and avoid letting your shoulders move up toward your ears. Keep your feet on the ground. As you get stronger, your feet should support less of your body weight as you lift yourself up. Hold for __________ seconds. Slowly lower yourself back into the chair. Repeat __________ times. Complete this exercise __________ times a day. Wall push-ups  Stand so you are facing a stable wall. Your feet should be about one arm-length away from the wall. Lean forward and place your palms on the wall at shoulder height. Keep your feet flat on the floor as you bend your elbows and lean forward toward the wall. Hold for __________ seconds. Straighten your elbows to push yourself back to the starting position. Repeat __________ times. Complete this exercise __________ times a day. This information is not intended to replace advice given to you by your health care provider. Make sure you discuss any questions you have with your healthcare provider. Document Revised: 02/25/2019 Document  Reviewed: 12/03/2018 Elsevier Patient Education  2022 [employer]  

## 2021-08-07 LAB — COMPLETE METABOLIC PANEL WITH GFR
AG Ratio: 1.5 (calc) (ref 1.0–2.5)
ALT: 18 U/L (ref 9–46)
AST: 20 U/L (ref 10–35)
Albumin: 4.2 g/dL (ref 3.6–5.1)
Alkaline phosphatase (APISO): 53 U/L (ref 35–144)
BUN/Creatinine Ratio: 18 (calc) (ref 6–22)
BUN: 32 mg/dL — ABNORMAL HIGH (ref 7–25)
CO2: 26 mmol/L (ref 20–32)
Calcium: 10.1 mg/dL (ref 8.6–10.3)
Chloride: 106 mmol/L (ref 98–110)
Creat: 1.74 mg/dL — ABNORMAL HIGH (ref 0.70–1.28)
Globulin: 2.8 g/dL (calc) (ref 1.9–3.7)
Glucose, Bld: 169 mg/dL — ABNORMAL HIGH (ref 65–99)
Potassium: 4.6 mmol/L (ref 3.5–5.3)
Sodium: 139 mmol/L (ref 135–146)
Total Bilirubin: 0.6 mg/dL (ref 0.2–1.2)
Total Protein: 7 g/dL (ref 6.1–8.1)
eGFR: 40 mL/min/{1.73_m2} — ABNORMAL LOW (ref >=60)

## 2021-08-07 LAB — CBC WITH DIFFERENTIAL/PLATELET
Absolute Monocytes: 529 cells/uL (ref 200–950)
Basophils Absolute: 38 cells/uL (ref 0–200)
Basophils Relative: 0.6 %
Eosinophils Absolute: 139 cells/uL (ref 15–500)
Eosinophils Relative: 2.2 %
HCT: 41.4 % (ref 38.5–50.0)
Hemoglobin: 13.7 g/dL (ref 13.2–17.1)
Lymphs Abs: 1556 cells/uL (ref 850–3900)
MCH: 30.1 pg (ref 27.0–33.0)
MCHC: 33.1 g/dL (ref 32.0–36.0)
MCV: 91 fL (ref 80.0–100.0)
MPV: 11.8 fL (ref 7.5–12.5)
Monocytes Relative: 8.4 %
Neutro Abs: 4038 cells/uL (ref 1500–7800)
Neutrophils Relative %: 64.1 %
Platelets: 257 10*3/uL (ref 140–400)
RBC: 4.55 10*6/uL (ref 4.20–5.80)
RDW: 14.6 % (ref 11.0–15.0)
Total Lymphocyte: 24.7 %
WBC: 6.3 10*3/uL (ref 3.8–10.8)

## 2021-08-07 LAB — URIC ACID: Uric Acid, Serum: 9.2 mg/dL — ABNORMAL HIGH (ref 4.0–8.0)

## 2021-08-07 NOTE — Progress Notes (Signed)
CBC WNL. Creatinine remains elevated and GFR is low-40.  Please forward lab work to his nephrologist. Uric acid remains elevated-9.2.  Ideally his uric acid should be less than 6.  Discussed the importance of avoiding a purine rich diet at his OV yesterday. Please further discuss the gout diet with the patient.   Reviewed lab work with Dr. Corliss Skains today. She recommends continuing on the current dose of uloric as prescribed.

## 2021-08-20 ENCOUNTER — Telehealth: Payer: Self-pay | Admitting: Emergency Medicine

## 2021-08-20 NOTE — Telephone Encounter (Signed)
Patient is requesting rx for Shingrix & flu Vaccine be sent to his pharmacy  Pharmacy: [facility] # [address] [place] - [address]  Phone:  [phone] Fax:  [phone]

## 2021-08-21 ENCOUNTER — Other Ambulatory Visit: Payer: Self-pay | Admitting: Emergency Medicine

## 2021-08-21 DIAGNOSIS — Z23 Encounter for immunization: Secondary | ICD-10-CM

## 2021-08-21 NOTE — Telephone Encounter (Signed)
I was able to send both.  Hopefully they will go through okay.  Thanks.

## 2021-10-07 ENCOUNTER — Other Ambulatory Visit: Payer: Self-pay

## 2021-10-07 ENCOUNTER — Ambulatory Visit (INDEPENDENT_AMBULATORY_CARE_PROVIDER_SITE_OTHER): Payer: Medicare HMO | Admitting: Ophthalmology

## 2021-10-07 ENCOUNTER — Encounter (INDEPENDENT_AMBULATORY_CARE_PROVIDER_SITE_OTHER): Payer: Self-pay | Admitting: Ophthalmology

## 2021-10-07 DIAGNOSIS — E113411 Type 2 diabetes mellitus with severe nonproliferative diabetic retinopathy with macular edema, right eye: Secondary | ICD-10-CM

## 2021-10-07 DIAGNOSIS — E113413 Type 2 diabetes mellitus with severe nonproliferative diabetic retinopathy with macular edema, bilateral: Secondary | ICD-10-CM | POA: Diagnosis not present

## 2021-10-07 DIAGNOSIS — E113412 Type 2 diabetes mellitus with severe nonproliferative diabetic retinopathy with macular edema, left eye: Secondary | ICD-10-CM

## 2021-10-07 NOTE — Assessment & Plan Note (Signed)
Improved CSME post focal and prior antivegF

## 2021-10-07 NOTE — Progress Notes (Signed)
  10/07/2021     CHIEF COMPLAINT Patient presents for  Chief Complaint  Patient presents with   Retina Follow Up      HISTORY OF PRESENT ILLNESS: Steven Knapp is a 74 y.o. male who presents to the clinic today for:   HPI     Retina Follow Up   Patient presents with  Diabetic Retinopathy.  In left eye.  This started 4 months ago.  Severity is mild.  Duration of 4 months.        Comments   4 mos fu OU oct. Pt states vision fluctuates with blood sugar, no noticeable changes. Denies new FOL or floaters.       Last edited by Kronstein, Anna N on 10/07/2021  1:47 PM.      Referring physician: Sagardia, Miguel Jose, MD [address] ,  [place] [address]  HISTORICAL INFORMATION:   Selected notes from the medical record:     Lab Results  Component Value Date   HGBA1C 7.3 (A) 01/28/2021     CURRENT MEDICATIONS: Current Outpatient Medications (Ophthalmic Drugs)  Medication Sig   BEPREVE 1.5 % SOLN 1 drop 2 (two) times daily.   SYSTANE ULTRA 0.4-0.3 % SOLN SMARTSIG:1 Drop(s) In Eye(s) As Needed   No current facility-administered medications for this visit. (Ophthalmic Drugs)   Current Outpatient Medications (Other)  Medication Sig   blood glucose meter kit and supplies Dispense based on patient and insurance preference. Use up to four times daily as directed. (FOR ICD-10 E10.9, E11.9).   clobetasol cream (TEMOVATE) 0.05 % Apply 1 application topically 2 (two) times daily.   colchicine 0.6 MG tablet Take 1 tablet (0.6 mg total) by mouth daily.   Continuous Blood Gluc Sensor (FREESTYLE LIBRE SENSOR SYSTEM) MISC 1 Device by Does not apply route daily.   febuxostat (ULORIC) 40 MG tablet Take 1 tablet (40 mg total) by mouth daily. With food   glipiZIDE (GLUCOTROL) 5 MG tablet Take 1 tablet (5 mg total) by mouth 2 (two) times daily before a meal.   ibuprofen (ADVIL) 200 MG tablet Take 200 mg by mouth every 6 (six) hours as needed for moderate pain.    Lactobacillus (PROBIOTIC ACIDOPHILUS PO) Take 1 capsule by mouth as needed.   Multiple Vitamins-Minerals (ZINC PO) Take by mouth daily.   rosuvastatin (CRESTOR) 10 MG tablet Take 1 tablet (10 mg total) by mouth daily.   sildenafil (VIAGRA) 100 MG tablet TAKE 1/2 TO ONE TABLET BY MOUTH DAILY AS NEEDED FOR ERECTILE DYSFUNCTION   No current facility-administered medications for this visit. (Other)      REVIEW OF SYSTEMS:    ALLERGIES Allergies  Allergen Reactions   Other Hives and Itching    Peanuts, strawberries, pickles, wine, and corn   Shellfish Allergy     PAST MEDICAL HISTORY Past Medical History:  Diagnosis Date   Arthritis    Cataract    Diabetes mellitus without complication (HCC)    Spinal stenosis    per patient   History reviewed. No pertinent surgical history.  FAMILY HISTORY Family History  Problem Relation Age of Onset   Diabetes Mother    Hypertension Mother    Diabetes Brother    Healthy Brother    Healthy Daughter    Healthy Daughter    Healthy Son    Healthy Son    Scoliosis Son     SOCIAL HISTORY Social History   Tobacco Use   Smoking status: Former    Years:   4.00    Types: Cigarettes   Smokeless tobacco: Never  Vaping Use   Vaping Use: Never used  Substance Use Topics   Alcohol use: Yes    Alcohol/week: 3.0 standard drinks    Types: 3 Standard drinks or equivalent per week    Comment: 2-3 times weekly   Drug use: Never         OPHTHALMIC EXAM:  Base Eye Exam     Visual Acuity (ETDRS)       Right Left   Dist [place] 20/20 -2 20/30 +2         Tonometry (Tonopen, 1:51 PM)       Right Left   Pressure 14 15         Pupils       Pupils Dark Light Shape APD   Right PERRL 3 2 Round None   Left PERRL 4 3 Round None         Extraocular Movement       Right Left    Full     -- -- --  --  --  -- -- --   -- -- --  --  --  -- -- --           Neuro/Psych     Oriented x3: Yes   Mood/Affect: Normal          Dilation     Both eyes: 1.0% Mydriacyl, 2.5% Phenylephrine @ 1:51 PM           Slit Lamp and Fundus Exam     External Exam       Right Left   External Normal Normal         Slit Lamp Exam       Right Left   Lids/Lashes Normal Normal   Conjunctiva/Sclera White and quiet White and quiet   Cornea Clear Clear   Anterior Chamber Deep and quiet Deep and quiet   Iris Round and reactive Round and reactive   Lens 2+ Nuclear sclerosis 2+ Nuclear sclerosis   Anterior Vitreous Normal Normal         Fundus Exam       Right Left   Posterior Vitreous Normal Posterior vitreous detachment, Central vitreous floaters   Disc Normal Normal   C/D Ratio 0.5 0.5   Macula Mild clinically significant macular edema, thickening superior to the fovea Mild clinically significant macular edema, Exudates, Microaneurysms   Vessels NPDR-Severe NPDR severe   Periphery Normal Normal            IMAGING AND PROCEDURES  Imaging and Procedures for 10/07/21  OCT, Retina - OU - Both Eyes       Right Eye Quality was borderline. Scan locations included subfoveal. Central Foveal Thickness: 325. Progression has worsened. Findings include abnormal foveal contour.   Left Eye Quality was borderline. Scan locations included subfoveal. Central Foveal Thickness: 279. Progression has been stable. Findings include abnormal foveal contour, cystoid macular edema.   Notes OD slight worsening of CSME inferotemporal to FAZ, not in region of previous focal laser we will continue to monitor and observe this as it is approaching center involvement  OS with prior center involved CSME, OS improved post focal laser we will continue to observe             ASSESSMENT/PLAN:  Severe nonproliferative diabetic retinopathy of left eye, with macular edema, associated with type 2 diabetes mellitus ([place]) Improved CSME post focal and prior antivegF  Severe  nonproliferative diabetic retinopathy of right eye,  with macular edema, associated with type 2 diabetes mellitus ([place]) New region of focal thickening inferotemporal to FAZ OD will continue to monitor and observe     ICD-10-CM   1. Severe nonproliferative diabetic retinopathy of left eye, with macular edema, associated with type 2 diabetes mellitus (HCC)  Q33.3545 OCT, Retina - OU - Both Eyes    2. Severe nonproliferative diabetic retinopathy of right eye, with macular edema, associated with type 2 diabetes mellitus ([place])  E11.3411       1.  Bilateral CSME, overall OS much improved, OD slight worsening we will monitor this closely follow-up again in 4 months  2.  Dilate OU next with OCT and color fundus photography  3.  Ophthalmic Meds Ordered this visit:  No orders of the defined types were placed in this encounter.      Return in about 4 months (around 02/04/2022) for DILATE OU, OCT, COLOR FP.  There are no Patient Instructions on file for this visit.   Explained the diagnoses, plan, and follow up with the patient and they expressed understanding.  Patient expressed understanding of the importance of proper follow up care.   Clent Demark Shatana Saxton M.D. Diseases & Surgery of the Retina and Vitreous Retina & Diabetic [place] 10/07/21     Abbreviations: M myopia (nearsighted); A astigmatism; H hyperopia (farsighted); P presbyopia; Mrx spectacle prescription;  CTL contact lenses; OD right eye; OS left eye; OU both eyes  XT exotropia; ET esotropia; PEK punctate epithelial keratitis; PEE punctate epithelial erosions; DES dry eye syndrome; MGD meibomian gland dysfunction; ATs artificial tears; PFAT's preservative free artificial tears; [place] nuclear sclerotic cataract; PSC posterior subcapsular cataract; ERM epi-retinal membrane; PVD posterior vitreous detachment; RD retinal detachment; DM diabetes mellitus; DR diabetic retinopathy; NPDR non-proliferative diabetic retinopathy; PDR proliferative diabetic retinopathy; CSME clinically significant  macular edema; DME diabetic macular edema; dbh dot blot hemorrhages; CWS cotton wool spot; POAG primary open angle glaucoma; C/D cup-to-disc ratio; HVF humphrey visual field; GVF goldmann visual field; OCT optical coherence tomography; IOP intraocular pressure; BRVO Branch retinal vein occlusion; CRVO central retinal vein occlusion; CRAO central retinal artery occlusion; BRAO branch retinal artery occlusion; RT retinal tear; SB scleral buckle; PPV pars plana vitrectomy; VH Vitreous hemorrhage; PRP panretinal laser photocoagulation; IVK intravitreal kenalog; VMT vitreomacular traction; MH Macular hole;  NVD neovascularization of the disc; NVE neovascularization elsewhere; AREDS age related eye disease study; ARMD age related macular degeneration; POAG primary open angle glaucoma; EBMD epithelial/anterior basement membrane dystrophy; ACIOL anterior chamber intraocular lens; IOL intraocular lens; PCIOL posterior chamber intraocular lens; Phaco/IOL phacoemulsification with intraocular lens placement; [place] photorefractive keratectomy; LASIK laser assisted in situ keratomileusis; HTN hypertension; DM diabetes mellitus; COPD chronic obstructive pulmonary disease

## 2021-10-07 NOTE — Assessment & Plan Note (Signed)
New region of focal thickening inferotemporal to FAZ OD will continue to monitor and observe

## 2021-12-03 DIAGNOSIS — E1122 Type 2 diabetes mellitus with diabetic chronic kidney disease: Secondary | ICD-10-CM | POA: Diagnosis not present

## 2021-12-03 DIAGNOSIS — N1831 Chronic kidney disease, stage 3a: Secondary | ICD-10-CM | POA: Diagnosis not present

## 2021-12-03 DIAGNOSIS — M109 Gout, unspecified: Secondary | ICD-10-CM | POA: Diagnosis not present

## 2021-12-03 DIAGNOSIS — E559 Vitamin D deficiency, unspecified: Secondary | ICD-10-CM | POA: Diagnosis not present

## 2021-12-03 DIAGNOSIS — M069 Rheumatoid arthritis, unspecified: Secondary | ICD-10-CM | POA: Diagnosis not present

## 2021-12-03 DIAGNOSIS — D631 Anemia in chronic kidney disease: Secondary | ICD-10-CM | POA: Diagnosis not present

## 2021-12-03 DIAGNOSIS — I129 Hypertensive chronic kidney disease with stage 1 through stage 4 chronic kidney disease, or unspecified chronic kidney disease: Secondary | ICD-10-CM | POA: Diagnosis not present

## 2022-01-16 NOTE — Progress Notes (Signed)
Office Visit Note  Patient: Steven Knapp             Date of Birth: Jun 09, 1946           MRN: [id number]             PCP: Georgina Quint, MD Referring: Georgina Quint, * Visit Date: 01/17/2022 Occupation: @GUAROCC @  Subjective:  Pain in multiple joints  History of Present Illness: Jams Juckett is a 76 y.o. male with history of gout, osteoarthritis and degenerative disc disease.  He states that he has been off Uloric for 1 year now.  He has colchicine but he did not have to use it.  He has not had any gout flares since then.  He continues to have some discomfort in his cervical spine, bilateral shoulders, bilateral hands and bilateral feet.  Activities of Daily Living:  Patient reports morning stiffness for several hours.   Patient Reports nocturnal pain.  Difficulty dressing/grooming: Denies Difficulty climbing stairs: Reports Difficulty getting out of chair: Reports Difficulty using hands for taps, buttons, cutlery, and/or writing: Reports  Review of Systems  Constitutional:  Negative for fatigue.  HENT:  Negative for mouth sores, mouth dryness and nose dryness.   Eyes:  Positive for dryness. Negative for pain and itching.  Respiratory:  Negative for shortness of breath and difficulty breathing.   Cardiovascular:  Negative for chest pain and palpitations.  Gastrointestinal:  Negative for blood in stool, constipation and diarrhea.  Endocrine: Negative for increased urination.  Genitourinary:  Negative for difficulty urinating.  Musculoskeletal:  Positive for joint pain, joint pain, myalgias, morning stiffness, muscle tenderness and myalgias. Negative for joint swelling.  Skin:  Positive for rash. Negative for color change.  Allergic/Immunologic: Negative for susceptible to infections.  Neurological:  Negative for dizziness, numbness, headaches, memory loss and weakness.  Hematological:  Negative for bruising/bleeding tendency.  Psychiatric/Behavioral:  Negative for  confusion.    PMFS History:  Patient Active Problem List   Diagnosis Date Noted   Primary osteoarthritis of both shoulders 05/24/2021   Primary osteoarthritis of both hands 05/24/2021   Primary osteoarthritis of both feet 05/24/2021   DDD (degenerative disc disease), cervical 05/24/2021   Prurigo nodularis 05/24/2021   Chronic renal impairment, stage 3a (HCC) 05/24/2021   Uncontrolled type 2 diabetes mellitus with hyperglycemia (HCC) 07/30/2020   Severe nonproliferative diabetic retinopathy of left eye, with macular edema, associated with type 2 diabetes mellitus (HCC) 02/21/2020   Severe nonproliferative diabetic retinopathy of right eye, with macular edema, associated with type 2 diabetes mellitus (HCC) 02/21/2020   Nuclear sclerotic cataract of both eyes 02/21/2020   Retinal hemorrhage of left eye 02/21/2020   Retinal exudates and deposits 02/21/2020   Alternating exotropia 02/21/2020   Diabetes mellitus (HCC) 10/06/2019   Spinal stenosis 10/06/2019   History of gout 10/06/2019   History of rheumatoid arthritis 10/06/2019   Osteoarthritis of wrist 11/05/2018   Gout 09/16/2018    Past Medical History:  Diagnosis Date   Arthritis    Cataract    Diabetes mellitus without complication (HCC)    Spinal stenosis    per patient    Family History  Problem Relation Age of Onset   Diabetes Mother    Hypertension Mother    Diabetes Brother    Soil scientist Daughter    Healthy Daughter    Healthy Son    Healthy Son    Scoliosis Son    History reviewed. No pertinent  surgical history. Social History   Social History Narrative   Not on file   Immunization History  Administered Date(s) Administered   Fluad Quad(high Dose 65+) 07/30/2020   Moderna Sars-Covid-2 Vaccination 11/28/2019, 12/29/2019, 07/18/2020   Pneumococcal Polysaccharide-23 10/06/2019     Objective: Vital Signs: BP (!) 158/82 (BP Location: Left Arm, Patient Position: Sitting, Cuff Size: Normal)    Pulse 61   Ht 6\' 3"  (1.905 m)   Wt 215 lb (97.5 kg)   BMI 26.87 kg/m    Physical Exam Vitals and nursing note reviewed.  Constitutional:      Appearance: He is well-developed.  HENT:     Head: Normocephalic and atraumatic.  Eyes:     Conjunctiva/sclera: Conjunctivae normal.     Pupils: Pupils are equal, round, and reactive to light.  Cardiovascular:     Rate and Rhythm: Normal rate and regular rhythm.     Heart sounds: Normal heart sounds.  Pulmonary:     Effort: Pulmonary effort is normal.     Breath sounds: Normal breath sounds.  Abdominal:     General: Bowel sounds are normal.     Palpations: Abdomen is soft.  Musculoskeletal:     Cervical back: Normal range of motion and neck supple.  Skin:    General: Skin is warm and dry.     Capillary Refill: Capillary refill takes less than 2 seconds.  Neurological:     Mental Status: He is alert and oriented to person, place, and time.  Psychiatric:        Behavior: Behavior normal.     Musculoskeletal Exam: C-spine had limited lateral rotation.  He had painful range of motion of bilateral shoulders.  Elbow joints and wrist joints with good range of motion.  He had bilateral PIP and DIP thickening.  He had bilateral middle finger Dupuytren's contractures.  Synovitis was noted over right wrist extensor tendons.  Hip joints with good range of motion.  He had warmth swelling and effusion in bilateral knee joints.  There was no tenderness over ankles or MTPs.  CDAI Exam: CDAI Score: -- Patient Global: --; Provider Global: -- Swollen: --; Tender: -- Joint Exam 01/17/2022   No joint exam has been documented for this visit   There is currently no information documented on the homunculus. Go to the Rheumatology activity and complete the homunculus joint exam.  Investigation: No additional findings.  Imaging: No results found.  Recent Labs: Lab Results  Component Value Date   WBC 6.3 08/06/2021   HGB 13.7 08/06/2021   PLT  257 08/06/2021   NA 139 08/06/2021   K 4.6 08/06/2021   CL 106 08/06/2021   CO2 26 08/06/2021   GLUCOSE 169 (H) 08/06/2021   BUN 32 (H) 08/06/2021   CREATININE 1.74 (H) 08/06/2021   BILITOT 0.6 08/06/2021   ALKPHOS 65 01/28/2021   AST 20 08/06/2021   ALT 18 08/06/2021   PROT 7.0 08/06/2021   ALBUMIN 4.0 01/28/2021   CALCIUM 10.1 08/06/2021   GFRAA 45 (L) 04/30/2021   August 14, 2021 creatinine 1.74, GFR 45, uric acid 9.2 February 28, 2021 uric acid 8.9  Speciality Comments: Allopurinol-nausea  Procedures:  No procedures performed Allergies: Other and Shellfish allergy   Assessment / Plan:     Visit Diagnoses: Idiopathic chronic gout of multiple sites without tophus -patient states that he has been off Uloric for over a year now.  He has not taken colchicine.  He denies having any gout flare.  He continues to have pain and discomfort in several joints.  He had tenosynovitis in his right wrist joint.  He had warmth swelling and effusion in bilateral knee joints.  I detailed discussion with the patient regarding management of gout.  His gout is not controlled.  He has ongoing symptoms from gout.  I advised him to resume Uloric 40 mg 1 tablet by mouth daily.  He will start it 2 days after starting colchicine.  He will restart colchicine 0.6 mg p.o. daily.  Uric acid: 08/06/2021 9.2 - Plan: Uric acid today.  We will repeat labs again in 3 months.  Hyperuricemia-he has longstanding history of hyperuricemia.  Primary osteoarthritis of both shoulders-he has painful range of motion of bilateral shoulder joints.  No effusion was noted.  Primary osteoarthritis of both hands-he has severe osteoarthritis in bilateral hands with incomplete fist formation.  He also has Dupuytren's contractures bilaterally.  Stretching exercises were discussed.  Dupuytren's contracture of both hands-bilateral middle finger Dupuytren's contractures were noted.  Primary osteoarthritis of both feet - X-ray showed  erosive changes and osteoarthritis.  He denies any discomfort in his feet.  There was no tenderness over ankles or MTPs.  DDD (degenerative disc disease), cervical-he had limited lateral rotation.  Positive ANA (antinuclear antibody) - Double-stranded DNA negative, Smith negative, anti-CCP negative.  He has no clinical features of lupus.  Prurigo nodularis - He gets injections by Dr. Delfina Redwood.  Chronic renal impairment, stage 3a (HCC) - Patient was evaluated by Dr. Ronalee Belts.  His GFR is in 40s.  Uncontrolled type 2 diabetes mellitus with hyperglycemia (HCC)-glucose was elevated in September 2022.  Nuclear sclerotic cataract of both eyes  Severe nonproliferative diabetic retinopathy of both eyes with macular edema associated with type 2 diabetes mellitus (HCC)  Regular alcohol consumption - Vodka 3 times a week decreasing alcohol consumption was discussed and dietary modifications were discussed.  Medication monitoring encounter - Plan: CBC with Differential/Platelet, COMPLETE METABOLIC PANEL WITH GFR  Orders: Orders Placed This Encounter  Procedures   CBC with Differential/Platelet   COMPLETE METABOLIC PANEL WITH GFR   Uric acid   Meds ordered this encounter  Medications   febuxostat (ULORIC) 40 MG tablet    Sig: Take 1 tablet (40 mg total) by mouth daily. With food    Dispense:  90 tablet    Refill:  0     Follow-Up Instructions: Return in about 3 months (around 04/19/2022) for Gout.   Pollyann Savoy, MD  Note - This record has been created using Animal nutritionist.  Chart creation errors have been sought, but may not always  have been located. Such creation errors do not reflect on  the standard of medical care.

## 2022-01-17 ENCOUNTER — Other Ambulatory Visit: Payer: Self-pay

## 2022-01-17 ENCOUNTER — Ambulatory Visit (INDEPENDENT_AMBULATORY_CARE_PROVIDER_SITE_OTHER): Payer: No Typology Code available for payment source | Admitting: Rheumatology

## 2022-01-17 ENCOUNTER — Encounter: Payer: Self-pay | Admitting: Rheumatology

## 2022-01-17 VITALS — BP 158/82 | HR 61 | Ht 75.0 in | Wt 215.0 lb

## 2022-01-17 DIAGNOSIS — E113413 Type 2 diabetes mellitus with severe nonproliferative diabetic retinopathy with macular edema, bilateral: Secondary | ICD-10-CM

## 2022-01-17 DIAGNOSIS — E1165 Type 2 diabetes mellitus with hyperglycemia: Secondary | ICD-10-CM | POA: Diagnosis not present

## 2022-01-17 DIAGNOSIS — M1A09X0 Idiopathic chronic gout, multiple sites, without tophus (tophi): Secondary | ICD-10-CM | POA: Diagnosis not present

## 2022-01-17 DIAGNOSIS — E790 Hyperuricemia without signs of inflammatory arthritis and tophaceous disease: Secondary | ICD-10-CM | POA: Diagnosis not present

## 2022-01-17 DIAGNOSIS — M19072 Primary osteoarthritis, left ankle and foot: Secondary | ICD-10-CM

## 2022-01-17 DIAGNOSIS — M19011 Primary osteoarthritis, right shoulder: Secondary | ICD-10-CM | POA: Diagnosis not present

## 2022-01-17 DIAGNOSIS — M19071 Primary osteoarthritis, right ankle and foot: Secondary | ICD-10-CM

## 2022-01-17 DIAGNOSIS — Z789 Other specified health status: Secondary | ICD-10-CM

## 2022-01-17 DIAGNOSIS — M5030 Other cervical disc degeneration, unspecified cervical region: Secondary | ICD-10-CM | POA: Diagnosis not present

## 2022-01-17 DIAGNOSIS — L281 Prurigo nodularis: Secondary | ICD-10-CM | POA: Diagnosis not present

## 2022-01-17 DIAGNOSIS — M720 Palmar fascial fibromatosis [Dupuytren]: Secondary | ICD-10-CM | POA: Diagnosis not present

## 2022-01-17 DIAGNOSIS — M19042 Primary osteoarthritis, left hand: Secondary | ICD-10-CM

## 2022-01-17 DIAGNOSIS — M19041 Primary osteoarthritis, right hand: Secondary | ICD-10-CM

## 2022-01-17 DIAGNOSIS — H2513 Age-related nuclear cataract, bilateral: Secondary | ICD-10-CM | POA: Diagnosis not present

## 2022-01-17 DIAGNOSIS — N1831 Chronic kidney disease, stage 3a: Secondary | ICD-10-CM | POA: Diagnosis not present

## 2022-01-17 DIAGNOSIS — R768 Other specified abnormal immunological findings in serum: Secondary | ICD-10-CM

## 2022-01-17 DIAGNOSIS — Z5181 Encounter for therapeutic drug level monitoring: Secondary | ICD-10-CM | POA: Diagnosis not present

## 2022-01-17 DIAGNOSIS — M19012 Primary osteoarthritis, left shoulder: Secondary | ICD-10-CM

## 2022-01-17 MED ORDER — FEBUXOSTAT 40 MG PO TABS: 40.0000 mg | ORAL_TABLET | Freq: Every day | ORAL | 0 refills | Status: DC

## 2022-01-17 NOTE — Patient Instructions (Addendum)
Start colchicine 0.6 mg tablet, 1 tablet daily. Start Uloric 40 mg tablet, 1 tablet daily( two days after Colchicine).  After 2 months you may discontinue colchicine.  Colchicine can be used only as needed for gout flare.  Gout Gout is a condition that causes painful swelling of the joints. Gout is a type of inflammation of the joints (arthritis). This condition is caused by having too much uric acid in the body. Uric acid is a chemical that forms when the body breaks down substances called purines. Purines are important for building body proteins. When the body has too much uric acid, sharp crystals can form and build up inside the joints. This causes pain and swelling. Gout attacks can happen quickly and may be very painful (acute gout). Over time, the attacks can affect more joints and become more frequent (chronic gout). Gout can also cause uric acid to build up under the skin and inside the kidneys. What are the causes? This condition is caused by too much uric acid in your blood. This can happen because: Your kidneys do not remove enough uric acid from your blood. This is the most common cause. Your body makes too much uric acid. This can happen with some cancers and cancer treatments. It can also occur if your body is breaking down too many red blood cells (hemolytic anemia). You eat too many foods that are high in purines. These foods include organ meats and some seafood. Alcohol, especially beer, is also high in purines. A gout attack may be triggered by trauma or stress. What increases the risk? You are more likely to develop this condition if you: Have a family history of gout. Are male and middle-aged. Are male and have gone through menopause. Are obese. Frequently drink alcohol, especially beer. Are dehydrated. Lose weight too quickly. Have an organ transplant. Have lead poisoning. Take certain medicines, including aspirin, cyclosporine, diuretics, levodopa, and niacin. Have  kidney disease. Have a skin condition called psoriasis. What are the signs or symptoms? An attack of acute gout happens quickly. It usually occurs in just one joint. The most common place is the big toe. Attacks often start at night. Other joints that may be affected include joints of the feet, ankle, knee, fingers, wrist, or elbow. Symptoms of this condition may include: Severe pain. Warmth. Swelling. Stiffness. Tenderness. The affected joint may be very painful to touch. Shiny, red, or purple skin. Chills and fever. Chronic gout may cause symptoms more frequently. More joints may be involved. You may also have white or yellow lumps (tophi) on your hands or feet or in other areas near your joints. How is this diagnosed? This condition is diagnosed based on your symptoms, medical history, and physical exam. You may have tests, such as: Blood tests to measure uric acid levels. Removal of joint fluid with a thin needle (aspiration) to look for uric acid crystals. X-rays to look for joint damage. How is this treated? Treatment for this condition has two phases: treating an acute attack and preventing future attacks. Acute gout treatment may include medicines to reduce pain and swelling, including: NSAIDs. Steroids. These are strong anti-inflammatory medicines that can be taken by mouth (orally) or injected into a joint. Colchicine. This medicine relieves pain and swelling when it is taken soon after an attack. It can be given by mouth or through an IV. Preventive treatment may include: Daily use of smaller doses of NSAIDs or colchicine. Use of a medicine that reduces uric acid levels in  your blood. Changes to your diet. You may need to see a dietitian about what to eat and drink to prevent gout. Follow these instructions at home: During a gout attack  If directed, put ice on the affected area: Put ice in a plastic bag. Place a towel between your skin and the bag. Leave the ice on for 20  minutes, 2-3 times a day. Raise (elevate) the affected joint above the level of your heart as often as possible. Rest the joint as much as possible. If the affected joint is in your leg, you may be given crutches to use. Follow instructions from your health care provider about eating or drinking restrictions. Avoiding future gout attacks Follow a low-purine diet as told by your dietitian or health care provider. Avoid foods and drinks that are high in purines, including liver, kidney, anchovies, asparagus, herring, mushrooms, mussels, and beer. Maintain a healthy weight or lose weight if you are overweight. If you want to lose weight, talk with your health care provider. It is important that you do not lose weight too quickly. Start or maintain an exercise program as told by your health care provider. Eating and drinking Drink enough fluids to keep your urine pale yellow. If you drink alcohol: Limit how much you use to: 0-1 drink a day for women. 0-2 drinks a day for men. Be aware of how much alcohol is in your drink. In the [place] one drink equals one 12 oz bottle of beer (355 mL) one 5 oz glass of wine (148 mL), or one 1 oz glass of hard liquor (44 mL). General instructions Take over-the-counter and prescription medicines only as told by your health care provider. Do not drive or use heavy machinery while taking prescription pain medicine. Return to your normal activities as told by your health care provider. Ask your health care provider what activities are safe for you. Keep all follow-up visits as told by your health care provider. This is important. Contact a health care provider if you have: Another gout attack. Continuing symptoms of a gout attack after 10 days of treatment. Side effects from your medicines. Chills or a fever. Burning pain when you urinate. Pain in your lower back or belly. Get help right away if you: Have severe or uncontrolled pain. Cannot  urinate. Summary Gout is painful swelling of the joints caused by inflammation. The most common site of pain is the big toe, but it can affect other joints in the body. Medicines and dietary changes can help to prevent and treat gout attacks. This information is not intended to replace advice given to you by your health care provider. Make sure you discuss any questions you have with your health care provider. Document Revised: 05/14/2018 Document Reviewed: 05/26/2018 Elsevier Patient Education  2022 [employer]   Hand Exercises Hand exercises can be helpful for almost anyone. These exercises can strengthen the hands, improve flexibility and movement, and increase blood flow to the hands. These results can make work and daily tasks easier. Hand exercises can be especially helpful for people who have joint pain from arthritis or have nerve damage from overuse (carpal tunnel syndrome). These exercises can also help people who have injured a hand. Exercises Most of these hand exercises are gentle stretching and motion exercises. It is usually safe to do them often throughout the day. Warming up your hands before exercise may help to reduce stiffness. You can do this with gentle massage or by placing your hands in warm  water for 10-15 minutes. It is normal to feel some stretching, pulling, tightness, or mild discomfort as you begin new exercises. This will gradually improve. Stop an exercise right away if you feel sudden, severe pain or your pain gets worse. Ask your health care provider which exercises are best for you. Knuckle bend or "claw" fist  Stand or sit with your arm, hand, and all five fingers pointed straight up. Make sure to keep your wrist straight during the exercise. Gently bend your fingers down toward your palm until the tips of your fingers are touching the top of your palm. Keep your big knuckle straight and just bend the small knuckles in your fingers. Hold this position for  __________ seconds. Straighten (extend) your fingers back to the starting position. Repeat this exercise 5-10 times with each hand. Full finger fist  Stand or sit with your arm, hand, and all five fingers pointed straight up. Make sure to keep your wrist straight during the exercise. Gently bend your fingers into your palm until the tips of your fingers are touching the middle of your palm. Hold this position for __________ seconds. Extend your fingers back to the starting position, stretching every joint fully. Repeat this exercise 5-10 times with each hand. Straight fist Stand or sit with your arm, hand, and all five fingers pointed straight up. Make sure to keep your wrist straight during the exercise. Gently bend your fingers at the big knuckle, where your fingers meet your hand, and the middle knuckle. Keep the knuckle at the tips of your fingers straight and try to touch the bottom of your palm. Hold this position for __________ seconds. Extend your fingers back to the starting position, stretching every joint fully. Repeat this exercise 5-10 times with each hand. Tabletop  Stand or sit with your arm, hand, and all five fingers pointed straight up. Make sure to keep your wrist straight during the exercise. Gently bend your fingers at the big knuckle, where your fingers meet your hand, as far down as you can while keeping the small knuckles in your fingers straight. Think of forming a tabletop with your fingers. Hold this position for __________ seconds. Extend your fingers back to the starting position, stretching every joint fully. Repeat this exercise 5-10 times with each hand. Finger spread  Place your hand flat on a table with your palm facing down. Make sure your wrist stays straight as you do this exercise. Spread your fingers and thumb apart from each other as far as you can until you feel a gentle stretch. Hold this position for __________ seconds. Bring your fingers and  thumb tight together again. Hold this position for __________ seconds. Repeat this exercise 5-10 times with each hand. Making circles  Stand or sit with your arm, hand, and all five fingers pointed straight up. Make sure to keep your wrist straight during the exercise. Make a circle by touching the tip of your thumb to the tip of your index finger. Hold for __________ seconds. Then open your hand wide. Repeat this motion with your thumb and each finger on your hand. Repeat this exercise 5-10 times with each hand. Thumb motion  Sit with your forearm resting on a table and your wrist straight. Your thumb should be facing up toward the ceiling. Keep your fingers relaxed as you move your thumb. Lift your thumb up as high as you can toward the ceiling. Hold for __________ seconds. Bend your thumb across your palm as far as you  can, reaching the tip of your thumb for the small finger (pinkie) side of your palm. Hold for __________ seconds. Repeat this exercise 5-10 times with each hand. Grip strengthening  Hold a stress ball or other soft ball in the middle of your hand. Slowly increase the pressure, squeezing the ball as much as you can without causing pain. Think of bringing the tips of your fingers into the middle of your palm. All of your finger joints should bend when doing this exercise. Hold your squeeze for __________ seconds, then relax. Repeat this exercise 5-10 times with each hand. Contact a health care provider if: Your hand pain or discomfort gets much worse when you do an exercise. Your hand pain or discomfort does not improve within 2 hours after you exercise. If you have any of these problems, stop doing these exercises right away. Do not do them again unless your health care provider says that you can. Get help right away if: You develop sudden, severe hand pain or swelling. If this happens, stop doing these exercises right away. Do not do them again unless your health care  provider says that you can. This information is not intended to replace advice given to you by your health care provider. Make sure you discuss any questions you have with your health care provider. Document Revised: 02/21/2021 Document Reviewed: 02/21/2021 Elsevier Patient Education  2022 [employer].

## 2022-01-18 LAB — CBC WITH DIFFERENTIAL/PLATELET
Absolute Monocytes: 644 cells/uL (ref 200–950)
Basophils Absolute: 33 cells/uL (ref 0–200)
Basophils Relative: 0.5 %
Eosinophils Absolute: 163 cells/uL (ref 15–500)
Eosinophils Relative: 2.5 %
HCT: 41.4 % (ref 38.5–50.0)
Hemoglobin: 13.7 g/dL (ref 13.2–17.1)
Lymphs Abs: 1463 cells/uL (ref 850–3900)
MCH: 29.5 pg (ref 27.0–33.0)
MCHC: 33.1 g/dL (ref 32.0–36.0)
MCV: 89.2 fL (ref 80.0–100.0)
MPV: 12.2 fL (ref 7.5–12.5)
Monocytes Relative: 9.9 %
Neutro Abs: 4199 cells/uL (ref 1500–7800)
Neutrophils Relative %: 64.6 %
Platelets: 236 10*3/uL (ref 140–400)
RBC: 4.64 10*6/uL (ref 4.20–5.80)
RDW: 14.7 % (ref 11.0–15.0)
Total Lymphocyte: 22.5 %
WBC: 6.5 10*3/uL (ref 3.8–10.8)

## 2022-01-18 LAB — COMPLETE METABOLIC PANEL WITH GFR
AG Ratio: 1.4 (calc) (ref 1.0–2.5)
ALT: 13 U/L (ref 9–46)
AST: 18 U/L (ref 10–35)
Albumin: 4.2 g/dL (ref 3.6–5.1)
Alkaline phosphatase (APISO): 60 U/L (ref 35–144)
BUN/Creatinine Ratio: 19 (calc) (ref 6–22)
BUN: 28 mg/dL — ABNORMAL HIGH (ref 7–25)
CO2: 23 mmol/L (ref 20–32)
Calcium: 10.2 mg/dL (ref 8.6–10.3)
Chloride: 109 mmol/L (ref 98–110)
Creat: 1.51 mg/dL — ABNORMAL HIGH (ref 0.70–1.28)
Globulin: 3.1 g/dL (calc) (ref 1.9–3.7)
Glucose, Bld: 121 mg/dL — ABNORMAL HIGH (ref 65–99)
Potassium: 4.4 mmol/L (ref 3.5–5.3)
Sodium: 141 mmol/L (ref 135–146)
Total Bilirubin: 0.3 mg/dL (ref 0.2–1.2)
Total Protein: 7.3 g/dL (ref 6.1–8.1)
eGFR: 47 mL/min/{1.73_m2} — ABNORMAL LOW (ref >=60)

## 2022-01-18 LAB — URIC ACID: Uric Acid, Serum: 10.7 mg/dL — ABNORMAL HIGH (ref 4.0–8.0)

## 2022-01-19 NOTE — Progress Notes (Signed)
CBC is normal, GFR is low at 47.  Uric acid is high at 10.7.  High uric acid puts patient at increased risk of gout.  He has been off Uloric for 1 year per patient. We discussed resuming colchicine and Uloric at the last visit.  Please have patient come in for lab work in 6 weeks which should include BMP with GFR and uric acid.

## 2022-01-21 ENCOUNTER — Telehealth: Payer: Self-pay

## 2022-01-21 DIAGNOSIS — M1A09X0 Idiopathic chronic gout, multiple sites, without tophus (tophi): Secondary | ICD-10-CM

## 2022-01-21 NOTE — Telephone Encounter (Signed)
Patient left a voicemail stating he was returning a call regarding his labwork results.   

## 2022-01-22 NOTE — Telephone Encounter (Signed)
See lab note for details.  

## 2022-01-22 NOTE — Telephone Encounter (Signed)
-----   Message from Pollyann Savoy, MD sent at 01/19/2022  7:42 PM EST ----- ?CBC is normal, GFR is low at 47.  Uric acid is high at 10.7.  High uric acid puts patient at increased risk of gout.  He has been off Uloric for 1 year per patient. We discussed resuming colchicine and Uloric at the last visit.  Please have patient c ?ome in for lab work in 6 weeks which should include BMP with GFR and uric acid. ?

## 2022-01-30 ENCOUNTER — Telehealth: Payer: Self-pay | Admitting: *Deleted

## 2022-01-30 NOTE — Telephone Encounter (Signed)
[facility] for  Uloric . Authorization has been APPROVED from 11/17/2021 to 01/29/2023.  ? ? ?

## 2022-02-04 ENCOUNTER — Ambulatory Visit: Payer: Medicare HMO | Admitting: Rheumatology

## 2022-02-06 ENCOUNTER — Other Ambulatory Visit: Payer: Self-pay

## 2022-02-06 ENCOUNTER — Ambulatory Visit (INDEPENDENT_AMBULATORY_CARE_PROVIDER_SITE_OTHER): Payer: No Typology Code available for payment source | Admitting: Ophthalmology

## 2022-02-06 DIAGNOSIS — H2513 Age-related nuclear cataract, bilateral: Secondary | ICD-10-CM

## 2022-02-06 DIAGNOSIS — E113413 Type 2 diabetes mellitus with severe nonproliferative diabetic retinopathy with macular edema, bilateral: Secondary | ICD-10-CM

## 2022-02-06 DIAGNOSIS — E113411 Type 2 diabetes mellitus with severe nonproliferative diabetic retinopathy with macular edema, right eye: Secondary | ICD-10-CM | POA: Diagnosis not present

## 2022-02-06 DIAGNOSIS — E113412 Type 2 diabetes mellitus with severe nonproliferative diabetic retinopathy with macular edema, left eye: Secondary | ICD-10-CM | POA: Diagnosis not present

## 2022-02-06 NOTE — Assessment & Plan Note (Signed)
For CSME temporal aspect of macula, stable over time.  We will continue to monitor ?

## 2022-02-06 NOTE — Assessment & Plan Note (Signed)
CSME OS temporally continued slow improvement.  Last injection into vegF OS 8 months previous ?

## 2022-02-06 NOTE — Assessment & Plan Note (Signed)
I discussed the darkening of his lenses in each eye and the particular symptoms that are associated with.  Follow-up with Dr. Sallye Lathristopher Groat as scheduled ?

## 2022-02-06 NOTE — Progress Notes (Signed)
? ? ?02/06/2022 ? ?  ? ?CHIEF COMPLAINT ?Patient presents for  ?Chief Complaint  ?Patient presents with  ? Diabetic Retinopathy with Macular Edema  ? ? ? ? ?HISTORY OF PRESENT ILLNESS: ?Steven Knapp is a 76 y.o. male who presents to the clinic today for:  ? ?HPI   ?4 mos for dilate ou, oct color fp. ?Pt states no vision changes however it is gradually improving. ?Pt denies FOL but sees floaters in both eyes. ? ?Last edited by Silvestre Moment on 02/06/2022  1:15 PM.  ?  ? ? ?Referring physician: ?Warden Fillers, MD ?[address] [place] ?[address] ?[place],  [place] [id number] ? ?HISTORICAL INFORMATION:  ? ?Selected notes from the [facility] ?  ? ?Lab Results  ?Component Value Date  ? HGBA1C 7.3 (A) 01/28/2021  ?  ? ?CURRENT MEDICATIONS: ?Current Outpatient Medications (Ophthalmic Drugs)  ?Medication Sig  ? BEPREVE 1.5 % SOLN 1 drop 2 (two) times daily.  ? SYSTANE ULTRA 0.4-0.3 % SOLN SMARTSIG:1 Drop(s) In Eye(s) As Needed  ? ?No current facility-administered medications for this visit. (Ophthalmic Drugs)  ? ?Current Outpatient Medications (Other)  ?Medication Sig  ? Arginine 500 MG CAPS Take by mouth daily.  ? blood glucose meter kit and supplies Dispense based on patient and insurance preference. Use up to four times daily as directed. (FOR ICD-10 E10.9, E11.9).  ? clobetasol cream (TEMOVATE) 2.99 % Apply 1 application topically 2 (two) times daily.  ? colchicine 0.6 MG tablet Take 1 tablet (0.6 mg total) by mouth daily. (Patient taking differently: Take 0.6 mg by mouth as needed.)  ? Continuous Blood Gluc Sensor (FREESTYLE LIBRE SENSOR SYSTEM) MISC 1 Device by Does not apply route daily.  ? febuxostat (ULORIC) 40 MG tablet Take 1 tablet (40 mg total) by mouth daily. With food  ? glipiZIDE (GLUCOTROL) 5 MG tablet Take 1 tablet (5 mg total) by mouth 2 (two) times daily before a meal. (Patient not taking: Reported on 01/17/2022)  ? ibuprofen (ADVIL) 200 MG tablet Take 200 mg by mouth every 6 (six) hours as needed for moderate pain.   ? Lactobacillus (PROBIOTIC ACIDOPHILUS PO) Take 1 capsule by mouth as needed.  ? Multiple Vitamins-Minerals (ZINC PO) Take by mouth as needed.  ? rosuvastatin (CRESTOR) 10 MG tablet Take 1 tablet (10 mg total) by mouth daily.  ? sildenafil (VIAGRA) 100 MG tablet TAKE 1/2 TO ONE TABLET BY MOUTH DAILY AS NEEDED FOR ERECTILE DYSFUNCTION  ? ?No current facility-administered medications for this visit. (Other)  ? ? ? ? ?REVIEW OF SYSTEMS: ?ROS   ?Negative for: Constitutional, Gastrointestinal, Neurological, Skin, Genitourinary, Musculoskeletal, HENT, Endocrine, Cardiovascular, Eyes, Respiratory, Psychiatric, Allergic/Imm, Heme/Lymph ?Last edited by Silvestre Moment on 02/06/2022  1:15 PM.  ?  ? ? ? ?ALLERGIES ?Allergies  ?Allergen Reactions  ? Other Hives and Itching  ?  Peanuts, strawberries, pickles, wine, and corn  ? Shellfish Allergy   ? ? ?PAST MEDICAL HISTORY ?Past Medical History:  ?Diagnosis Date  ? Arthritis   ? Cataract   ? Diabetes mellitus without complication ([place])   ? Spinal stenosis   ? per patient  ? ?No past surgical history on file. ? ?FAMILY HISTORY ?Family History  ?Problem Relation Age of Onset  ? Diabetes Mother   ? Hypertension Mother   ? Diabetes Brother   ? Healthy Brother   ? Healthy Daughter   ? Healthy Daughter   ? Healthy Son   ? Healthy Son   ? Scoliosis Son   ? ? ?  SOCIAL HISTORY ?Social History  ? ?Tobacco Use  ? Smoking status: Former  ?  Years: 4.00  ?  Types: Cigarettes  ?  Passive exposure: Past  ? Smokeless tobacco: Never  ?Vaping Use  ? Vaping Use: Never used  ?Substance Use Topics  ? Alcohol use: Yes  ?  Alcohol/week: 3.0 standard drinks  ?  Types: 3 Standard drinks or equivalent per week  ?  Comment: 2-3 times weekly  ? Drug use: Never  ? ?  ? ?  ? ?OPHTHALMIC EXAM: ? ?Base Eye Exam   ? ? Visual Acuity (ETDRS)   ? ?   Right Left  ? Dist Johnson 20/25 -1+2 20/20  ? ?  ?  ? ? Tonometry (Tonopen, 1:22 PM)   ? ?   Right Left  ? Pressure 14 15  ? ?  ?  ? ? Pupils   ? ?   Pupils APD  ? Right PERRL None   ? Left PERRL None  ? ?  ?  ? ? Visual Fields   ? ?   Left Right  ?  Full Full  ? ?  ?  ? ? Extraocular Movement   ? ?   Right Left  ?  Full Full  ? ?  ?  ? ? Neuro/Psych   ? ? Oriented x3: Yes  ? Mood/Affect: Normal  ? ?  ?  ? ? Dilation   ? ? Both eyes: 1.0% Mydriacyl, 2.5% Phenylephrine @ 1:22 PM  ? ?  ?  ? ?  ? ?Slit Lamp and Fundus Exam   ? ? External Exam   ? ?   Right Left  ? External Normal Normal  ? ?  ?  ? ? Slit Lamp Exam   ? ?   Right Left  ? Lids/Lashes Normal Normal  ? Conjunctiva/Sclera White and quiet White and quiet  ? Cornea Clear Clear  ? Anterior Chamber Deep and quiet Deep and quiet  ? Iris Round and reactive Round and reactive  ? Lens 2+ Nuclear sclerosis 2+ Nuclear sclerosis  ? Anterior Vitreous Normal Normal  ? ?  ?  ? ? Fundus Exam   ? ?   Right Left  ? Posterior Vitreous Normal Posterior vitreous detachment, Central vitreous floaters  ? Disc Normal Normal  ? C/D Ratio 0.5 0.5  ? Macula Mild clinically significant macular edema, thickening superior to the fovea Mild clinically significant macular edema, Exudates, Microaneurysms  ? Vessels NPDR-Severe NPDR severe  ? Periphery Normal Normal  ? ?  ?  ? ?  ? ? ?IMAGING AND PROCEDURES  ?Imaging and Procedures for 02/06/22 ? ?OCT, Retina - OU - Both Eyes   ? ?   ?Right Eye ?Quality was borderline. Scan locations included subfoveal. Central Foveal Thickness: 325. Progression has been stable. Findings include abnormal foveal contour.  ? ?Left Eye ?Quality was borderline. Scan locations included subfoveal. Central Foveal Thickness: 281. Progression has improved. Findings include abnormal foveal contour, cystoid macular edema.  ? ?Notes ?OD able CSME inferotemporal to FAZ, not in region of previous focal laser we will continue to monitor and observe this as it is approaching center involvement yet with good acuity can still observe ? ?OS with prior center involved CSME, OS improved post focal laser we will continue to observe, continued improvement  OS. ? ?  ? ?Color Fundus Photography Optos - OU - Both Eyes   ? ?   ?Right Eye ?Progression has no  prior data. Disc findings include normal observations. Macula : microaneurysms. Vessels : normal observations. Periphery : normal observations.  ? ?Left Eye ?Progression has no prior data. Disc findings include normal observations. Macula : microaneurysms. Vessels : normal observations. Periphery : normal observations.  ? ?Notes ?Mild to moderate NPDR OU with some vitreous debris, from vitreous floaters but no active macular edema in either eye. ? ?  ? ? ?  ?  ? ?  ?ASSESSMENT/PLAN: ? ?Severe nonproliferative diabetic retinopathy of right eye, with macular edema, associated with type 2 diabetes mellitus ([place]) ?For CSME temporal aspect of macula, stable over time.  We will continue to monitor ? ?Severe nonproliferative diabetic retinopathy of left eye, with macular edema, associated with type 2 diabetes mellitus ([place]) ?CSME OS temporally continued slow improvement.  Last injection into vegF OS 8 months previous ? ?Nuclear sclerotic cataract of both eyes ?I discussed the darkening of his lenses in each eye and the particular symptoms that are associated with.  Follow-up with Dr. Warden Fillers as scheduled  ? ?  ICD-10-CM   ?1. Severe nonproliferative diabetic retinopathy of left eye, with macular edema, associated with type 2 diabetes mellitus ([place])  H22.5750 OCT, Retina - OU - Both Eyes  ?  Color Fundus Photography Optos - OU - Both Eyes  ?  ?2. Severe nonproliferative diabetic retinopathy of right eye, with macular edema, associated with type 2 diabetes mellitus ([place])  N18.3358   ?  ?3. Nuclear sclerotic cataract of both eyes  H25.13   ?  ? ? ?1.  Bilateral minor CSME with good acuity ability to observe.  We will continue to monitor closely ? ?2.  Bilateral cataract in each eye worsening follow-up Dr. Warden Fillers ? ?3. ? ?Ophthalmic Meds Ordered this visit:  ?No orders of the defined types were placed in this  encounter. ? ? ?  ? ?Return in about 6 months (around 08/09/2022) for DILATE OU, COLOR FP, OCT. ? ?There are no Patient Instructions on file for this visit. ? ? ?Explained the diagnoses, plan, and fol

## 2022-02-25 DIAGNOSIS — H40023 Open angle with borderline findings, high risk, bilateral: Secondary | ICD-10-CM | POA: Diagnosis not present

## 2022-02-25 DIAGNOSIS — H1045 Other chronic allergic conjunctivitis: Secondary | ICD-10-CM | POA: Diagnosis not present

## 2022-02-25 DIAGNOSIS — H16223 Keratoconjunctivitis sicca, not specified as Sjogren's, bilateral: Secondary | ICD-10-CM | POA: Diagnosis not present

## 2022-02-25 DIAGNOSIS — E113391 Type 2 diabetes mellitus with moderate nonproliferative diabetic retinopathy without macular edema, right eye: Secondary | ICD-10-CM | POA: Diagnosis not present

## 2022-02-25 DIAGNOSIS — E113312 Type 2 diabetes mellitus with moderate nonproliferative diabetic retinopathy with macular edema, left eye: Secondary | ICD-10-CM | POA: Diagnosis not present

## 2022-02-25 DIAGNOSIS — H2513 Age-related nuclear cataract, bilateral: Secondary | ICD-10-CM | POA: Diagnosis not present

## 2022-03-25 DIAGNOSIS — L2089 Other atopic dermatitis: Secondary | ICD-10-CM | POA: Diagnosis not present

## 2022-03-25 DIAGNOSIS — L281 Prurigo nodularis: Secondary | ICD-10-CM | POA: Diagnosis not present

## 2022-04-04 NOTE — Progress Notes (Unsigned)
Office Visit Note  Patient: Steven Knapp             Date of Birth: 1946-10-14           MRN: [id number]             PCP: Horald Pollen, MD Referring: Horald Pollen, * Visit Date: 04/18/2022 Occupation: @GUAROCC @  Subjective:  Pain and swelling in left wrist  History of Present Illness: Steven Knapp is a 76 y.o. male with history of gout and osteoarthritis. Patient was advised to restart uloric 40 mg daily after his last office visit on 01/17/22.  He has been taking colchicine as needed during flares.  He states that he started to have a flare on Sunday after having barbecue at his 60th high school reunion.  He states he has been taking colchicine since Sunday which has started to alleviate some of the pain and swelling.  He states the flare initially started in his right wrist but has now in his left wrist.  He is having tenderness and swelling in the left wrist joint currently.  He denies any other joint pain or joint swelling at this time.  He has been taking acetaminophen and hydrocodone as needed for pain relief.   Activities of Daily Living:  Patient reports morning stiffness for all day. Patient Reports nocturnal pain.  Difficulty dressing/grooming: Denies Difficulty climbing stairs: Denies Difficulty getting out of chair: Denies Difficulty using hands for taps, buttons, cutlery, and/or writing: Reports  Review of Systems  Constitutional:  Negative for fatigue.  HENT:  Negative for mouth sores, mouth dryness and nose dryness.   Eyes:  Positive for itching and dryness. Negative for pain.  Respiratory:  Negative for shortness of breath and difficulty breathing.   Cardiovascular:  Negative for chest pain and palpitations.  Gastrointestinal:  Negative for blood in stool, constipation and diarrhea.  Endocrine: Negative for increased urination.  Genitourinary:  Negative for difficulty urinating.  Musculoskeletal:  Positive for joint pain, joint pain, joint swelling and  morning stiffness. Negative for myalgias, muscle tenderness and myalgias.  Skin:  Positive for rash. Negative for color change.  Allergic/Immunologic: Negative for susceptible to infections.  Neurological:  Negative for dizziness, numbness, headaches, memory loss and weakness.  Hematological:  Negative for bruising/bleeding tendency.  Psychiatric/Behavioral:  Negative for confusion.    PMFS History:  Patient Active Problem List   Diagnosis Date Noted   Tear duct infection, left 04/07/2022   Primary osteoarthritis of both shoulders 05/24/2021   Primary osteoarthritis of both hands 05/24/2021   Primary osteoarthritis of both feet 05/24/2021   DDD (degenerative disc disease), cervical 05/24/2021   Prurigo nodularis 05/24/2021   Chronic renal impairment, stage 3a ([place]) 05/24/2021   Uncontrolled type 2 diabetes mellitus with hyperglycemia ([place]) 07/30/2020   Severe nonproliferative diabetic retinopathy of left eye, with macular edema, associated with type 2 diabetes mellitus ([place]) 02/21/2020   Severe nonproliferative diabetic retinopathy of right eye, with macular edema, associated with type 2 diabetes mellitus ([place]) 02/21/2020   Nuclear sclerotic cataract of both eyes 02/21/2020   Retinal hemorrhage of left eye 02/21/2020   Retinal exudates and deposits 02/21/2020   Alternating exotropia 02/21/2020   Diabetes mellitus ([place]) 10/06/2019   Spinal stenosis 10/06/2019   History of gout 10/06/2019   History of rheumatoid arthritis 10/06/2019   Osteoarthritis of wrist 11/05/2018   Gout 09/16/2018    Past Medical History:  Diagnosis Date   Arthritis    Cataract  Diabetes mellitus without complication ([place])    Spinal stenosis    per patient    Family History  Problem Relation Age of Onset   Diabetes Mother    Hypertension Mother    Diabetes Brother    Therapist, occupational Daughter    Healthy Daughter    Healthy Son    Healthy Son    Scoliosis Son    History reviewed. No  pertinent surgical history. Social History   Social History Narrative   Not on file   Immunization History  Administered Date(s) Administered   Fluad Quad(high Dose 65+) 07/30/2020   Moderna Sars-Covid-2 Vaccination 11/28/2019, 12/29/2019, 07/18/2020   Pneumococcal Polysaccharide-23 10/06/2019     Objective: Vital Signs: BP 133/80 (BP Location: Left Arm, Patient Position: Sitting, Cuff Size: Large)   Pulse 64   Ht 6\' 2"  (1.88 m)   Wt 208 lb (94.3 kg)   BMI 26.71 kg/m    Physical Exam Vitals and nursing note reviewed.  Constitutional:      Appearance: He is well-developed.  HENT:     Head: Normocephalic and atraumatic.  Eyes:     Conjunctiva/sclera: Conjunctivae normal.     Pupils: Pupils are equal, round, and reactive to light.  Cardiovascular:     Rate and Rhythm: Normal rate and regular rhythm.     Heart sounds: Normal heart sounds.  Pulmonary:     Effort: Pulmonary effort is normal.     Breath sounds: Normal breath sounds.  Abdominal:     General: Bowel sounds are normal.     Palpations: Abdomen is soft.  Musculoskeletal:     Cervical back: Normal range of motion and neck supple.  Skin:    General: Skin is warm and dry.     Capillary Refill: Capillary refill takes less than 2 seconds.  Neurological:     Mental Status: He is alert and oriented to person, place, and time.  Psychiatric:        Behavior: Behavior normal.     Musculoskeletal Exam: C-spine limited range of motion bilateral rotation.  Painful range of motion and stiffness of both shoulder joints.  Mild extensor tenosynovitis of the right wrist noted.  Left wrist has tenderness and synovitis.  Diffuse swelling of the left hand noted.  Some tenderness and inflammation of the left second MCP and PIP joints.  Incomplete left fist formation noted.  Dupuytren's contractures of bilateral middle fingers noted.  Hip joints have good range of motion with no groin pain.  Warmth and swelling in both knee joints  right greater than left.  Ankle joints have good range of motion with no joint tenderness or joint swelling.  CDAI Exam: CDAI Score: -- Patient Global: --; Provider Global: -- Swollen: 6 ; Tender: 3  Joint Exam 04/18/2022      Right  Left  Wrist  Swollen   Swollen Tender  MCP 2     Swollen Tender  PIP 2     Swollen Tender  Knee  Swollen   Swollen      Investigation: No additional findings.  Imaging: No results found.  Recent Labs: Lab Results  Component Value Date   WBC 6.5 01/17/2022   HGB 13.7 01/17/2022   PLT 236 01/17/2022   NA 141 01/17/2022   K 4.4 01/17/2022   CL 109 01/17/2022   CO2 23 01/17/2022   GLUCOSE 121 (H) 01/17/2022   BUN 28 (H) 01/17/2022   CREATININE 1.51 (H) 01/17/2022  BILITOT 0.3 01/17/2022   ALKPHOS 65 01/28/2021   AST 18 01/17/2022   ALT 13 01/17/2022   PROT 7.3 01/17/2022   ALBUMIN 4.0 01/28/2021   CALCIUM 10.2 01/17/2022   GFRAA 45 (L) 04/30/2021    Speciality Comments: Allopurinol-nausea  Procedures:  No procedures performed Allergies: Other and Shellfish allergy   Assessment / Plan:     Visit Diagnoses: Idiopathic chronic gout of multiple sites without tophus - He presents today experiencing a gout flare in his left wrist/hand.  He has tenderness and inflammation of the left wrist as well as diffuse swelling of the left hand.  He also has postinflammatory skin peeling on his right hand/wrist where the flare initially started.  Mild extensor tenosynovitis of the right wrist was noted on examination today.  This past weekend he had barbecue at his 60th high school anniversary and started to have a flare the following day.  He has been taking Uloric 40 mg 1 tablet daily and takes colchicine during flares.  He has noticed improvement in the joint pain and swelling since initiating colchicine but continues to have difficulty using his left wrist.  Discussed the importance of avoiding a purine rich diet.  He was given a handout of  information about dietary recommendations and modifications.  His uric acid was 10.7 on 01/17/2022.  Uric acid, CBC, and CMP were updated today.  Discussed that ideally his uric acid level should be less than 6.  We will notify him if any medication changes are recommended pending lab results. For his current flare a prescription for prednisone starting at 10 mg tapering by 2.5 mg every 2 days was sent to the pharmacy to treat his current flare.  He was advised to avoid the use of NSAIDs while taking prednisone.  He was also advised to monitor his blood glucose closely while taking the low-dose short prednisone taper.  He will notify us if he starts to have more frequent gout flares. He will follow-up in the office in 3 months or sooner if needed.  - Plan: colchicine 0.6 MG tablet, Uric acid   Medication monitoring encounter - Plan: COMPLETE METABOLIC PANEL WITH GFR, CBC with Differential/Platelet, Uric acid  Hyperuricemia - He has longstanding history of hyperuricemia.  Uric acid was 10.7 on 01/17/2022.  He was advised to restart Uloric 40 mg daily at that time.  Uric acid level be rechecked today.- Plan: Uric acid  Primary osteoarthritis of both shoulders: Painful range of motion and stiffness in both shoulder joints noted.  No tenderness upon palpation.  Primary osteoarthritis of both hands: He has PIP and DIP thickening consistent with osteoarthritis of both hands.  Incomplete left fist formation.   Dupuytren's contracture of both hands - Bilateral middle finger Dupuytren's contractures were noted.  Unchanged  Primary osteoarthritis of both feet - X-ray showed erosive changes and osteoarthritis.  He is not experiencing any discomfort in his feet at this time.  DDD (degenerative disc disease), cervical: C-spine has limited range of motion with lateral rotation.  Positive ANA (antinuclear antibody) - Double-stranded DNA negative, Smith negative, anti-CCP negative.  He has no clinical features of  lupus.  Other medical conditions are listed as follows:  Prurigo nodularis - He gets injections by Dr. April Manson.  Chronic renal impairment, stage 3a ([place]) - Patient was evaluated by Dr. Carolin Sicks.  Uncontrolled type 2 diabetes mellitus with hyperglycemia ([place]): Advised to monitor blood sugar closely while taking the low-dose short prednisone taper.  Nuclear sclerotic cataract of  both eyes  Severe nonproliferative diabetic retinopathy of both eyes with macular edema associated with type 2 diabetes mellitus ([facility])  Regular alcohol consumption - Vodka 3 times a week   Orders: Orders Placed This Encounter  Procedures   COMPLETE METABOLIC PANEL WITH GFR   CBC with Differential/Platelet   Uric acid   Meds ordered this encounter  Medications   colchicine 0.6 MG tablet    Sig: Take 1 tablet (0.6 mg total) by mouth daily as needed.    Dispense:  30 tablet    Refill:  1   predniSONE (DELTASONE) 5 MG tablet    Sig: Take 2 tablets by mouth daily x2 days, 1.5 tablets daily x2 days, 1 tablet daily x2 days, 1 tablet daily x2 days.    Dispense:  10 tablet    Refill:  0   .  Follow-Up Instructions: Return in about 3 months (around 07/19/2022) for Gout, Osteoarthritis.   Ofilia Neas, PA-C  Note - This record has been created using Dragon software.  Chart creation errors have been sought, but may not always  have been located. Such creation errors do not reflect on  the standard of medical care.

## 2022-04-07 ENCOUNTER — Ambulatory Visit (INDEPENDENT_AMBULATORY_CARE_PROVIDER_SITE_OTHER): Payer: No Typology Code available for payment source | Admitting: Internal Medicine

## 2022-04-07 ENCOUNTER — Encounter: Payer: Self-pay | Admitting: Internal Medicine

## 2022-04-07 VITALS — BP 124/78 | HR 80 | Temp 98.2°F | Ht 75.0 in | Wt 212.0 lb

## 2022-04-07 DIAGNOSIS — H04302 Unspecified dacryocystitis of left lacrimal passage: Secondary | ICD-10-CM | POA: Diagnosis not present

## 2022-04-07 MED ORDER — AMOXICILLIN-POT CLAVULANATE 875-125 MG PO TABS: 1.0000 | ORAL_TABLET | Freq: Two times a day (BID) | ORAL | 0 refills | Status: DC

## 2022-04-07 NOTE — Progress Notes (Signed)
Subjective:    Patient ID: Steven Knapp, male    DOB: [birth year], 76 y.o.   MRN: [id number]      HPI Steven Knapp is here for  Chief Complaint  Patient presents with   Facial Swelling    Left side of face (underneath left eye) swelling noted (no pain or discomfort)    Swollen left side of face,?  Allergy-2-3 days ago he felt soreness in the medial corner of his left eye.  Then he developed some swelling in the left upper and lower eyelids that progressed to his cheek.  He denies any injury, insect bites.  He did have a little bit of something in the corner of his eye-not true pus.  He did warm compresses and the swelling has improved, but is still there.  He denies excessive tearing.  He denies eye pain, but the eye does feel little dry and irritated.  He still has soreness in the medial corner of the eye.  He has had a runny nose since this started.   He does have some food allergies, but has never had swelling similar to this.    Medications and allergies reviewed with patient and updated if appropriate.  Current Outpatient Medications on File Prior to Visit  Medication Sig Dispense Refill   Arginine 500 MG CAPS Take by mouth daily.     BEPREVE 1.5 % SOLN 1 drop 2 (two) times daily.     blood glucose meter kit and supplies Dispense based on patient and insurance preference. Use up to four times daily as directed. (FOR ICD-10 E10.9, E11.9). 1 each 0   clobetasol cream (TEMOVATE) 4.69 % Apply 1 application topically 2 (two) times daily.     colchicine 0.6 MG tablet Take 1 tablet (0.6 mg total) by mouth daily. (Patient taking differently: Take 0.6 mg by mouth as needed.) 30 tablet 1   Continuous Blood Gluc Sensor (FREESTYLE LIBRE SENSOR SYSTEM) MISC 1 Device by Does not apply route daily. 2 each 0   febuxostat (ULORIC) 40 MG tablet Take 1 tablet (40 mg total) by mouth daily. With food 90 tablet 0   glipiZIDE (GLUCOTROL) 5 MG tablet Take 1 tablet (5 mg total) by mouth 2 (two) times daily  before a meal. (Patient not taking: Reported on 01/17/2022) 180 tablet 3   ibuprofen (ADVIL) 200 MG tablet Take 200 mg by mouth every 6 (six) hours as needed for moderate pain.     Lactobacillus (PROBIOTIC ACIDOPHILUS PO) Take 1 capsule by mouth as needed.     Multiple Vitamins-Minerals (ZINC PO) Take by mouth as needed.     rosuvastatin (CRESTOR) 10 MG tablet Take 1 tablet (10 mg total) by mouth daily. 90 tablet 3   sildenafil (VIAGRA) 100 MG tablet TAKE 1/2 TO ONE TABLET BY MOUTH DAILY AS NEEDED FOR ERECTILE DYSFUNCTION 5 tablet 6   SYSTANE ULTRA 0.4-0.3 % SOLN SMARTSIG:1 Drop(s) In Eye(s) As Needed     No current facility-administered medications on file prior to visit.    Review of Systems  Constitutional:  Negative for chills and fever.  Eyes:  Negative for pain, discharge, redness and visual disturbance.  Neurological:  Negative for light-headedness and headaches.      Objective:   Vitals:   04/07/22 1452  BP: 124/78  Pulse: 80  Temp: 98.2 F (36.8 C)  SpO2: 99%   BP Readings from Last 3 Encounters:  04/07/22 124/78  01/17/22 (!) 158/82  08/06/21 122/75   Wt Readings  from Last 3 Encounters:  04/07/22 212 lb (96.2 kg)  01/17/22 215 lb (97.5 kg)  08/06/21 213 lb (96.6 kg)   Body mass index is 26.5 kg/m.    Physical Exam Constitutional:      General: He is not in acute distress.    Appearance: Normal appearance.  HENT:     Head: Normocephalic.  Eyes:     General:        Right eye: No discharge.        Left eye: No discharge.     Extraocular Movements: Extraocular movements intact.     Conjunctiva/sclera: Conjunctivae normal.     Comments: Left medial corner of eye along tear duct is tender to palpation.  Swelling in left upper eyelid-moderate in nature.  Minimal swelling in the left lower eyelid.  Mild swelling left cheek.  Skin:    General: Skin is warm and dry.     Findings: Erythema (Left upper eyelid and left cheek slightly erythematous) present.   Neurological:     Mental Status: He is alert.           Assessment & Plan:    See Problem List for Assessment and Plan of chronic medical problems.

## 2022-04-07 NOTE — Patient Instructions (Addendum)
   Medications changes include :   augmentin twice daily x 10 days.   Your prescription(s) have been sent to your pharmacy.    Keep doing warm compresses      See your eye doctor if your symptoms do not improve.

## 2022-04-07 NOTE — Assessment & Plan Note (Signed)
Acute Left medial tear duct seems to be blocked and infected with associated left upper and lower eyelid swelling and cheek swelling with mild erythema Symptoms have improved with warm compresses and he will continue to use warm compresses Start Augmentin 875-125 mg twice daily x10 days Advised that if his symptoms are not improving or worsen he needs to see his eye doctor immediately-sees Dr. Dione Booze He will call with any questions

## 2022-04-08 DIAGNOSIS — L2089 Other atopic dermatitis: Secondary | ICD-10-CM | POA: Diagnosis not present

## 2022-04-18 ENCOUNTER — Telehealth: Payer: Self-pay | Admitting: Rheumatology

## 2022-04-18 ENCOUNTER — Encounter: Payer: Self-pay | Admitting: Physician Assistant

## 2022-04-18 ENCOUNTER — Ambulatory Visit (INDEPENDENT_AMBULATORY_CARE_PROVIDER_SITE_OTHER): Payer: No Typology Code available for payment source | Admitting: Physician Assistant

## 2022-04-18 VITALS — BP 133/80 | HR 64 | Ht 74.0 in | Wt 208.0 lb

## 2022-04-18 DIAGNOSIS — E1165 Type 2 diabetes mellitus with hyperglycemia: Secondary | ICD-10-CM | POA: Diagnosis not present

## 2022-04-18 DIAGNOSIS — Z789 Other specified health status: Secondary | ICD-10-CM

## 2022-04-18 DIAGNOSIS — M720 Palmar fascial fibromatosis [Dupuytren]: Secondary | ICD-10-CM

## 2022-04-18 DIAGNOSIS — M19042 Primary osteoarthritis, left hand: Secondary | ICD-10-CM

## 2022-04-18 DIAGNOSIS — M1A09X0 Idiopathic chronic gout, multiple sites, without tophus (tophi): Secondary | ICD-10-CM

## 2022-04-18 DIAGNOSIS — N1831 Chronic kidney disease, stage 3a: Secondary | ICD-10-CM | POA: Diagnosis not present

## 2022-04-18 DIAGNOSIS — E790 Hyperuricemia without signs of inflammatory arthritis and tophaceous disease: Secondary | ICD-10-CM | POA: Diagnosis not present

## 2022-04-18 DIAGNOSIS — H2513 Age-related nuclear cataract, bilateral: Secondary | ICD-10-CM

## 2022-04-18 DIAGNOSIS — M19071 Primary osteoarthritis, right ankle and foot: Secondary | ICD-10-CM | POA: Diagnosis not present

## 2022-04-18 DIAGNOSIS — L281 Prurigo nodularis: Secondary | ICD-10-CM

## 2022-04-18 DIAGNOSIS — M19011 Primary osteoarthritis, right shoulder: Secondary | ICD-10-CM

## 2022-04-18 DIAGNOSIS — Z5181 Encounter for therapeutic drug level monitoring: Secondary | ICD-10-CM | POA: Diagnosis not present

## 2022-04-18 DIAGNOSIS — M19041 Primary osteoarthritis, right hand: Secondary | ICD-10-CM

## 2022-04-18 DIAGNOSIS — R768 Other specified abnormal immunological findings in serum: Secondary | ICD-10-CM | POA: Diagnosis not present

## 2022-04-18 DIAGNOSIS — M19072 Primary osteoarthritis, left ankle and foot: Secondary | ICD-10-CM

## 2022-04-18 DIAGNOSIS — E113413 Type 2 diabetes mellitus with severe nonproliferative diabetic retinopathy with macular edema, bilateral: Secondary | ICD-10-CM

## 2022-04-18 DIAGNOSIS — M5030 Other cervical disc degeneration, unspecified cervical region: Secondary | ICD-10-CM | POA: Diagnosis not present

## 2022-04-18 DIAGNOSIS — M19012 Primary osteoarthritis, left shoulder: Secondary | ICD-10-CM

## 2022-04-18 MED ORDER — PREDNISONE 5 MG PO TABS: ORAL_TABLET | ORAL | 0 refills | Status: DC

## 2022-04-18 MED ORDER — COLCHICINE 0.6 MG PO TABS: 0.6000 mg | ORAL_TABLET | Freq: Every day | ORAL | 1 refills | Status: DC | PRN

## 2022-04-18 NOTE — Telephone Encounter (Signed)
Victorino Dike from [facility] pharmacy called the office stating the patient was there now to pick up a prescription of the prednisone taper. They need clarification of the directions. She states she thinks they were written incorrectly. [phone]

## 2022-04-18 NOTE — Patient Instructions (Signed)

## 2022-04-18 NOTE — Telephone Encounter (Signed)
Spoke with Victorino DikeJennifer at [facility] and advised per Sherron Alesaylor Dale, PA-C the taper should start with 10 mg and taper by 2.5 mg every 2 days. Advised the end of the taper direction should state 0.5 tab x 2 days.

## 2022-04-19 LAB — CBC WITH DIFFERENTIAL/PLATELET
Absolute Monocytes: 498 cells/uL (ref 200–950)
Basophils Absolute: 32 cells/uL (ref 0–200)
Basophils Relative: 0.5 %
Eosinophils Absolute: 176 cells/uL (ref 15–500)
Eosinophils Relative: 2.8 %
HCT: 39.6 % (ref 38.5–50.0)
Hemoglobin: 12.8 g/dL — ABNORMAL LOW (ref 13.2–17.1)
Lymphs Abs: 1361 cells/uL (ref 850–3900)
MCH: 29 pg (ref 27.0–33.0)
MCHC: 32.3 g/dL (ref 32.0–36.0)
MCV: 89.8 fL (ref 80.0–100.0)
MPV: 11.6 fL (ref 7.5–12.5)
Monocytes Relative: 7.9 %
Neutro Abs: 4234 cells/uL (ref 1500–7800)
Neutrophils Relative %: 67.2 %
Platelets: 251 10*3/uL (ref 140–400)
RBC: 4.41 10*6/uL (ref 4.20–5.80)
RDW: 14.1 % (ref 11.0–15.0)
Total Lymphocyte: 21.6 %
WBC: 6.3 10*3/uL (ref 3.8–10.8)

## 2022-04-19 LAB — COMPLETE METABOLIC PANEL WITH GFR
AG Ratio: 1.1 (calc) (ref 1.0–2.5)
ALT: 10 U/L (ref 9–46)
AST: 18 U/L (ref 10–35)
Albumin: 3.9 g/dL (ref 3.6–5.1)
Alkaline phosphatase (APISO): 66 U/L (ref 35–144)
BUN/Creatinine Ratio: 20 (calc) (ref 6–22)
BUN: 30 mg/dL — ABNORMAL HIGH (ref 7–25)
CO2: 25 mmol/L (ref 20–32)
Calcium: 9.6 mg/dL (ref 8.6–10.3)
Chloride: 105 mmol/L (ref 98–110)
Creat: 1.52 mg/dL — ABNORMAL HIGH (ref 0.70–1.28)
Globulin: 3.4 g/dL (calc) (ref 1.9–3.7)
Glucose, Bld: 170 mg/dL — ABNORMAL HIGH (ref 65–99)
Potassium: 4.4 mmol/L (ref 3.5–5.3)
Sodium: 138 mmol/L (ref 135–146)
Total Bilirubin: 0.4 mg/dL (ref 0.2–1.2)
Total Protein: 7.3 g/dL (ref 6.1–8.1)
eGFR: 47 mL/min/{1.73_m2} — ABNORMAL LOW (ref >=60)

## 2022-04-19 LAB — URIC ACID: Uric Acid, Serum: 9.3 mg/dL — ABNORMAL HIGH (ref 4.0–8.0)

## 2022-04-21 NOTE — Progress Notes (Signed)
Glucose is 170.  Creatinine remains elevated and GFR is low-47. He should avoid the use of NSAIDs.  CBC stable.  Uric acid remains elevated but has improved-9.3.  ideally his uric acid should be less than 6. Dietary recommendations were discussed at his recent visit.   Please clarify if the prednisone taper has started to alleviate his flare?

## 2022-04-22 DIAGNOSIS — L209 Atopic dermatitis, unspecified: Secondary | ICD-10-CM | POA: Diagnosis not present

## 2022-04-22 NOTE — Telephone Encounter (Signed)
NOTE NOT NEEDED ?

## 2022-05-06 ENCOUNTER — Ambulatory Visit: Payer: No Typology Code available for payment source | Admitting: Emergency Medicine

## 2022-05-06 ENCOUNTER — Ambulatory Visit (INDEPENDENT_AMBULATORY_CARE_PROVIDER_SITE_OTHER): Payer: No Typology Code available for payment source | Admitting: Emergency Medicine

## 2022-05-06 ENCOUNTER — Encounter: Payer: Self-pay | Admitting: Emergency Medicine

## 2022-05-06 VITALS — BP 134/72 | HR 53 | Temp 98.0°F | Ht 74.0 in | Wt 209.2 lb

## 2022-05-06 DIAGNOSIS — E1165 Type 2 diabetes mellitus with hyperglycemia: Secondary | ICD-10-CM | POA: Diagnosis not present

## 2022-05-06 DIAGNOSIS — Z8739 Personal history of other diseases of the musculoskeletal system and connective tissue: Secondary | ICD-10-CM

## 2022-05-06 DIAGNOSIS — N1831 Chronic kidney disease, stage 3a: Secondary | ICD-10-CM | POA: Diagnosis not present

## 2022-05-06 DIAGNOSIS — Z23 Encounter for immunization: Secondary | ICD-10-CM | POA: Diagnosis not present

## 2022-05-06 DIAGNOSIS — E1169 Type 2 diabetes mellitus with other specified complication: Secondary | ICD-10-CM

## 2022-05-06 DIAGNOSIS — L2089 Other atopic dermatitis: Secondary | ICD-10-CM | POA: Diagnosis not present

## 2022-05-06 LAB — POCT GLYCOSYLATED HEMOGLOBIN (HGB A1C): Hemoglobin A1C: 6.9 % — AB (ref 4.0–5.6)

## 2022-05-06 LAB — MICROALBUMIN / CREATININE URINE RATIO
Creatinine,U: 149.3 mg/dL
Microalb Creat Ratio: 20.2 mg/g (ref 0.0–30.0)
Microalb, Ur: 30.1 mg/dL — ABNORMAL HIGH (ref 0.0–1.9)

## 2022-05-06 MED ORDER — GLIPIZIDE 5 MG PO TABS
5.0000 mg | ORAL_TABLET | Freq: Two times a day (BID) | ORAL | 3 refills | Status: DC
Start: 2022-05-06 — End: 2023-04-19

## 2022-05-06 MED ORDER — SILDENAFIL CITRATE 100 MG PO TABS: ORAL_TABLET | ORAL | 6 refills | Status: DC

## 2022-05-06 NOTE — Progress Notes (Signed)
Steven Knapp 76 y.o.   Chief Complaint  Patient presents with   Follow-up   Medication Management    Patient needs a new rx for a blood glucose meter and kit     HISTORY OF PRESENT ILLNESS: This is a 76 y.o. male A1A with history of diabetes here for follow-up. History of gout with recent flare up. Doing well.  Has no complaints or medical concerns today. Swimming regularly.  Good appetite.  HPI   Prior to Admission medications   Medication Sig Start Date End Date Taking? Authorizing Provider  amoxicillin-clavulanate (AUGMENTIN) 875-125 MG tablet Take 1 tablet by mouth 2 (two) times daily. 04/07/22  Yes Burns, Claudina Lick, MD  Arginine 500 MG CAPS Take by mouth daily.   Yes [provider]  BEPREVE 1.5 % SOLN 1 drop 2 (two) times daily. 02/29/20  Yes [provider]  clobetasol cream (TEMOVATE) 3.29 % Apply 1 application topically 2 (two) times daily.   Yes [provider]  colchicine 0.6 MG tablet Take 1 tablet (0.6 mg total) by mouth daily as needed. 04/18/22  Yes Ofilia Neas, PA-C  Dupilumab (DUPIXENT [place]) Inject into the skin.   Yes [provider]  febuxostat (ULORIC) 40 MG tablet Take 1 tablet (40 mg total) by mouth daily. With food 01/17/22  Yes Deveshwar, Abel Presto, MD  Lactobacillus (PROBIOTIC ACIDOPHILUS PO) Take 1 capsule by mouth as needed.   Yes [provider]  Multiple Vitamins-Minerals (ZINC PO) Take by mouth as needed.   Yes [provider]  predniSONE (DELTASONE) 5 MG tablet Take 2 tablets by mouth daily x2 days, 1.5 tablets daily x2 days, 1 tablet daily x2 days, 1 tablet daily x2 days. 04/18/22  Yes Ofilia Neas, PA-C  sildenafil (VIAGRA) 100 MG tablet TAKE 1/2 TO ONE TABLET BY MOUTH DAILY AS NEEDED FOR ERECTILE DYSFUNCTION 06/18/21  Yes Usher Hedberg, Ines Bloomer, MD  SYSTANE ULTRA 0.4-0.3 % SOLN SMARTSIG:1 Drop(s) In Eye(s) As Needed 11/23/19  Yes [provider]  blood glucose meter kit and supplies Dispense based on  patient and insurance preference. Use up to four times daily as directed. (FOR ICD-10 E10.9, E11.9). Patient not taking: Reported on 05/06/2022 02/07/21   Horald Pollen, MD  Continuous Blood Gluc Sensor (FREESTYLE LIBRE SENSOR SYSTEM) MISC 1 Device by Does not apply route daily. Patient not taking: Reported on 05/06/2022 03/07/21   Horald Pollen, MD  glipiZIDE (GLUCOTROL) 5 MG tablet Take 1 tablet (5 mg total) by mouth 2 (two) times daily before a meal. 01/28/21 04/18/22  Roneka Gilpin, Ines Bloomer, MD  ibuprofen (ADVIL) 200 MG tablet Take 200 mg by mouth every 6 (six) hours as needed for moderate pain. Patient not taking: Reported on 05/06/2022    [provider]  rosuvastatin (CRESTOR) 10 MG tablet Take 1 tablet (10 mg total) by mouth daily. Patient not taking: Reported on 05/06/2022 07/30/20   Horald Pollen, MD    Allergies  Allergen Reactions   Other Hives and Itching    Peanuts, strawberries, pickles, wine, and corn   Shellfish Allergy     Patient Active Problem List   Diagnosis Date Noted   Tear duct infection, left 04/07/2022   Primary osteoarthritis of both shoulders 05/24/2021   Primary osteoarthritis of both hands 05/24/2021   Primary osteoarthritis of both feet 05/24/2021   DDD (degenerative disc disease), cervical 05/24/2021   Prurigo nodularis 05/24/2021   Chronic renal impairment, stage 3a ([place]) 05/24/2021   Uncontrolled type  2 diabetes mellitus with hyperglycemia ([place]) 07/30/2020   Severe nonproliferative diabetic retinopathy of left eye, with macular edema, associated with type 2 diabetes mellitus ([place]) 02/21/2020   Severe nonproliferative diabetic retinopathy of right eye, with macular edema, associated with type 2 diabetes mellitus ([place]) 02/21/2020   Nuclear sclerotic cataract of both eyes 02/21/2020   Retinal hemorrhage of left eye 02/21/2020   Retinal exudates and deposits 02/21/2020   Alternating exotropia 02/21/2020   Diabetes mellitus ([place])  10/06/2019   Spinal stenosis 10/06/2019   History of gout 10/06/2019   History of rheumatoid arthritis 10/06/2019   Osteoarthritis of wrist 11/05/2018   Gout 09/16/2018    Past Medical History:  Diagnosis Date   Arthritis    Cataract    Diabetes mellitus without complication ([place])    Spinal stenosis    per patient    History reviewed. No pertinent surgical history.  Social History   Socioeconomic History   Marital status: Single    Spouse name: Not on file   Number of children: Not on file   Years of education: Not on file   Highest education level: Not on file  Occupational History   Not on file  Tobacco Use   Smoking status: Former    Years: 4.00    Types: Cigarettes    Passive exposure: Past   Smokeless tobacco: Never  Vaping Use   Vaping Use: Never used  Substance and Sexual Activity   Alcohol use: Yes    Alcohol/week: 3.0 standard drinks of alcohol    Types: 3 Standard drinks or equivalent per week    Comment: 2-3 times weekly   Drug use: Never   Sexual activity: Not on file  Other Topics Concern   Not on file  Social History Narrative   Not on file   Social Determinants of Health   Financial Resource Strain: Not on file  Food Insecurity: Not on file  Transportation Needs: Not on file  Physical Activity: Not on file  Stress: Not on file  Social Connections: Not on file  Intimate Partner Violence: Not on file    Family History  Problem Relation Age of Onset   Diabetes Mother    Hypertension Mother    Diabetes Brother    Healthy Brother    Healthy Daughter    Healthy Daughter    Healthy Son    Healthy Son    Scoliosis Son      Review of Systems  Constitutional: Negative.  Negative for chills and fever.  HENT: Negative.  Negative for congestion and sore throat.   Respiratory: Negative.  Negative for cough and shortness of breath.   Cardiovascular: Negative.  Negative for chest pain and palpitations.  Gastrointestinal: Negative.   Negative for abdominal pain, diarrhea, nausea and vomiting.  Genitourinary:  Negative for dysuria and hematuria.  Musculoskeletal:  Positive for joint pain.  Skin: Negative.  Negative for rash.  Neurological: Negative.  Negative for dizziness and headaches.  All other systems reviewed and are negative.  Today's Vitals   05/06/22 1318  BP: 134/72  Pulse: (!) 53  Temp: 98 F (36.7 C)  TempSrc: Oral  SpO2: 96%  Weight: 209 lb 4 oz (94.9 kg)  Height: _0  (1.88 m)   Body mass index is 26.87 kg/m.   Physical Exam Vitals reviewed.  Constitutional:      Appearance: Normal appearance.  HENT:     Head: Normocephalic.     Mouth/Throat:     Mouth:  Mucous membranes are moist.     Pharynx: Oropharynx is clear.  Eyes:     Extraocular Movements: Extraocular movements intact.     Conjunctiva/sclera: Conjunctivae normal.     Pupils: Pupils are equal, round, and reactive to light.  Cardiovascular:     Rate and Rhythm: Normal rate and regular rhythm.     Pulses: Normal pulses.     Heart sounds: Normal heart sounds.  Pulmonary:     Effort: Pulmonary effort is normal.     Breath sounds: Normal breath sounds.  Musculoskeletal:     Cervical back: No tenderness.     Right lower leg: No edema.     Left lower leg: No edema.  Lymphadenopathy:     Cervical: No cervical adenopathy.  Skin:    General: Skin is warm and dry.     Capillary Refill: Capillary refill takes less than 2 seconds.  Neurological:     General: No focal deficit present.     Mental Status: He is alert and oriented to person, place, and time.  Psychiatric:        Mood and Affect: Mood normal.        Behavior: Behavior normal.    Results for orders placed or performed in visit on 05/06/22 (from the past 24 hour(s))  POCT glycosylated hemoglobin (Hb A1C)     Status: Abnormal   Collection Time: 05/06/22  1:34 PM  Result Value Ref Range   Hemoglobin A1C 6.9 (A) 4.0 - 5.6 %   HbA1c POC (<> result, manual entry)      HbA1c, POC (prediabetic range)     HbA1c, POC (controlled diabetic range)       ASSESSMENT & PLAN: A total of 47 minutes was spent with the patient and counseling/coordination of care regarding preparing for this visit, review of most recent office visit notes, review of multiple chronic medical problems under management, review of all medications, review of most recent blood work results including today's hemoglobin A1c, education on nutrition, cardiovascular risks associated with diabetes, prognosis, documentation, need for follow-up.  Problem List Items Addressed This Visit       Endocrine   Diabetes mellitus ([place])   Relevant Medications   glipiZIDE (GLUCOTROL) 5 MG tablet   Uncontrolled type 2 diabetes mellitus with hyperglycemia ([place]) - Primary    Controlled diabetes with hemoglobin A1c of 6.9.  Continue glipizide 5 mg twice a day. Diet and nutrition discussed. Cardiovascular risks associated with diabetes discussed.      Relevant Medications   glipiZIDE (GLUCOTROL) 5 MG tablet   Other Relevant Orders   Urine Microalbumin w/creat. ratio   POCT glycosylated hemoglobin (Hb A1C) (Completed)     Genitourinary   Chronic renal impairment, stage 3a ([facility])    Advised to stay well-hydrated and avoid NSAIDs.        Other   History of gout    Recent flareup but much better now.  Continues daily colchicine 0.6 mg.      Other Visit Diagnoses     Stage 3a chronic kidney disease ([place])       Need for vaccination       Relevant Orders   Pneumococcal conjugate vaccine 20-valent (Prevnar 20)   Varicella-zoster vaccine IM (Shingrix)      Patient Instructions  Health Maintenance After Age 16 After age 71, you are at a higher risk for certain long-term diseases and infections as well as injuries from falls. Falls are a major cause of broken  bones and head injuries in people who are older than age 11. Getting regular preventive care can help to keep you healthy and well. Preventive  care includes getting regular testing and making lifestyle changes as recommended by your health care provider. Talk with your health care provider about: Which screenings and tests you should have. A screening is a test that checks for a disease when you have no symptoms. A diet and exercise plan that is right for you. What should I know about screenings and tests to prevent falls? Screening and testing are the best ways to find a health problem early. Early diagnosis and treatment give you the best chance of managing medical conditions that are common after age 32. Certain conditions and lifestyle choices may make you more likely to have a fall. Your health care provider may recommend: Regular vision checks. Poor vision and conditions such as cataracts can make you more likely to have a fall. If you wear glasses, make sure to get your prescription updated if your vision changes. Medicine review. Work with your health care provider to regularly review all of the medicines you are taking, including over-the-counter medicines. Ask your health care provider about any side effects that may make you more likely to have a fall. Tell your health care provider if any medicines that you take make you feel dizzy or sleepy. Strength and balance checks. Your health care provider may recommend certain tests to check your strength and balance while standing, walking, or changing positions. Foot health exam. Foot pain and numbness, as well as not wearing proper footwear, can make you more likely to have a fall. Screenings, including: Osteoporosis screening. Osteoporosis is a condition that causes the bones to get weaker and break more easily. Blood pressure screening. Blood pressure changes and medicines to control blood pressure can make you feel dizzy. Depression screening. You may be more likely to have a fall if you have a fear of falling, feel depressed, or feel unable to do activities that you used to  do. Alcohol use screening. Using too much alcohol can affect your balance and may make you more likely to have a fall. Follow these instructions at home: Lifestyle Do not drink alcohol if: Your health care provider tells you not to drink. If you drink alcohol: Limit how much you have to: 0-1 drink a day for women. 0-2 drinks a day for men. Know how much alcohol is in your drink. In the [place] one drink equals one 12 oz bottle of beer (355 mL), one 5 oz glass of wine (148 mL), or one 1 oz glass of hard liquor (44 mL). Do not use any products that contain nicotine or tobacco. These products include cigarettes, chewing tobacco, and vaping devices, such as e-cigarettes. If you need help quitting, ask your health care provider. Activity  Follow a regular exercise program to stay fit. This will help you maintain your balance. Ask your health care provider what types of exercise are appropriate for you. If you need a cane or walker, use it as recommended by your health care provider. Wear supportive shoes that have nonskid soles. Safety  Remove any tripping hazards, such as rugs, cords, and clutter. Install safety equipment such as grab bars in bathrooms and safety rails on stairs. Keep rooms and walkways well-lit. General instructions Talk with your health care provider about your risks for falling. Tell your health care provider if: You fall. Be sure to tell your health care provider about all  falls, even ones that seem minor. You feel dizzy, tiredness (fatigue), or off-balance. Take over-the-counter and prescription medicines only as told by your health care provider. These include supplements. Eat a healthy diet and maintain a healthy weight. A healthy diet includes low-fat dairy products, low-fat (lean) meats, and fiber from whole grains, beans, and lots of fruits and vegetables. Stay current with your vaccines. Schedule regular health, dental, and eye exams. Summary Having a healthy  lifestyle and getting preventive care can help to protect your health and wellness after age 18. Screening and testing are the best way to find a health problem early and help you avoid having a fall. Early diagnosis and treatment give you the best chance for managing medical conditions that are more common for people who are older than age 9. Falls are a major cause of broken bones and head injuries in people who are older than age 52. Take precautions to prevent a fall at home. Work with your health care provider to learn what changes you can make to improve your health and wellness and to prevent falls. This information is not intended to replace advice given to you by your health care provider. Make sure you discuss any questions you have with your health care provider. Document Revised: 03/25/2021 Document Reviewed: 03/25/2021 Elsevier Patient Education  [place], MD [facility]

## 2022-05-06 NOTE — Assessment & Plan Note (Signed)
Advised to stay well-hydrated and avoid NSAIDs. ?

## 2022-05-06 NOTE — Patient Instructions (Signed)
Health Maintenance After Age 78 After age 78, you are at a higher risk for certain long-term diseases and infections as well as injuries from falls. Falls are a major cause of broken bones and head injuries in people who are older than age 78. Getting regular preventive care can help to keep you healthy and well. Preventive care includes getting regular testing and making lifestyle changes as recommended by your health care provider. Talk with your health care provider about: Which screenings and tests you should have. A screening is a test that checks for a disease when you have no symptoms. A diet and exercise plan that is right for you. What should I know about screenings and tests to prevent falls? Screening and testing are the best ways to find a health problem early. Early diagnosis and treatment give you the best chance of managing medical conditions that are common after age 78. Certain conditions and lifestyle choices may make you more likely to have a fall. Your health care provider may recommend: Regular vision checks. Poor vision and conditions such as cataracts can make you more likely to have a fall. If you wear glasses, make sure to get your prescription updated if your vision changes. Medicine review. Work with your health care provider to regularly review all of the medicines you are taking, including over-the-counter medicines. Ask your health care provider about any side effects that may make you more likely to have a fall. Tell your health care provider if any medicines that you take make you feel dizzy or sleepy. Strength and balance checks. Your health care provider may recommend certain tests to check your strength and balance while standing, walking, or changing positions. Foot health exam. Foot pain and numbness, as well as not wearing proper footwear, can make you more likely to have a fall. Screenings, including: Osteoporosis screening. Osteoporosis is a condition that causes  the bones to get weaker and break more easily. Blood pressure screening. Blood pressure changes and medicines to control blood pressure can make you feel dizzy. Depression screening. You may be more likely to have a fall if you have a fear of falling, feel depressed, or feel unable to do activities that you used to do. Alcohol use screening. Using too much alcohol can affect your balance and may make you more likely to have a fall. Follow these instructions at home: Lifestyle Do not drink alcohol if: Your health care provider tells you not to drink. If you drink alcohol: Limit how much you have to: 0-1 drink a day for women. 0-2 drinks a day for men. Know how much alcohol is in your drink. In the [place] one drink equals one 12 oz bottle of beer (355 mL), one 5 oz glass of wine (148 mL), or one 1 oz glass of hard liquor (44 mL). Do not use any products that contain nicotine or tobacco. These products include cigarettes, chewing tobacco, and vaping devices, such as e-cigarettes. If you need help quitting, ask your health care provider. Activity  Follow a regular exercise program to stay fit. This will help you maintain your balance. Ask your health care provider what types of exercise are appropriate for you. If you need a cane or walker, use it as recommended by your health care provider. Wear supportive shoes that have nonskid soles. Safety  Remove any tripping hazards, such as rugs, cords, and clutter. Install safety equipment such as grab bars in bathrooms and safety rails on stairs. Keep rooms and walkways   well-lit. General instructions Talk with your health care provider about your risks for falling. Tell your health care provider if: You fall. Be sure to tell your health care provider about all falls, even ones that seem minor. You feel dizzy, tiredness (fatigue), or off-balance. Take over-the-counter and prescription medicines only as told by your health care provider. These include  supplements. Eat a healthy diet and maintain a healthy weight. A healthy diet includes low-fat dairy products, low-fat (lean) meats, and fiber from whole grains, beans, and lots of fruits and vegetables. Stay current with your vaccines. Schedule regular health, dental, and eye exams. Summary Having a healthy lifestyle and getting preventive care can help to protect your health and wellness after age 78. Screening and testing are the best way to find a health problem early and help you avoid having a fall. Early diagnosis and treatment give you the best chance for managing medical conditions that are more common for people who are older than age 78. Falls are a major cause of broken bones and head injuries in people who are older than age 78. Take precautions to prevent a fall at home. Work with your health care provider to learn what changes you can make to improve your health and wellness and to prevent falls. This information is not intended to replace advice given to you by your health care provider. Make sure you discuss any questions you have with your health care provider. Document Revised: 03/25/2021 Document Reviewed: 03/25/2021 Elsevier Patient Education  2023 [employer]vier Inc.  

## 2022-05-06 NOTE — Assessment & Plan Note (Signed)
Recent flareup but much better now.  Continues daily colchicine 0.6 mg.

## 2022-05-06 NOTE — Assessment & Plan Note (Signed)
Controlled diabetes with hemoglobin A1c of 6.9.  Continue glipizide 5 mg twice a day. Diet and nutrition discussed. Cardiovascular risks associated with diabetes discussed.

## 2022-05-07 ENCOUNTER — Other Ambulatory Visit: Payer: Self-pay | Admitting: Emergency Medicine

## 2022-05-07 DIAGNOSIS — E1165 Type 2 diabetes mellitus with hyperglycemia: Secondary | ICD-10-CM

## 2022-05-07 DIAGNOSIS — N1831 Chronic kidney disease, stage 3a: Secondary | ICD-10-CM

## 2022-05-07 MED ORDER — DAPAGLIFLOZIN PROPANEDIOL 10 MG PO TABS: 10.0000 mg | ORAL_TABLET | Freq: Every day | ORAL | 1 refills | Status: AC

## 2022-05-21 DIAGNOSIS — L209 Atopic dermatitis, unspecified: Secondary | ICD-10-CM | POA: Diagnosis not present

## 2022-05-24 LAB — POCT GLYCOSYLATED HEMOGLOBIN (HGB A1C): Hemoglobin-A1c: 166

## 2022-06-04 DIAGNOSIS — M069 Rheumatoid arthritis, unspecified: Secondary | ICD-10-CM | POA: Diagnosis not present

## 2022-06-04 DIAGNOSIS — D631 Anemia in chronic kidney disease: Secondary | ICD-10-CM | POA: Diagnosis not present

## 2022-06-04 DIAGNOSIS — M109 Gout, unspecified: Secondary | ICD-10-CM | POA: Diagnosis not present

## 2022-06-04 DIAGNOSIS — N1831 Chronic kidney disease, stage 3a: Secondary | ICD-10-CM | POA: Diagnosis not present

## 2022-06-04 DIAGNOSIS — E1122 Type 2 diabetes mellitus with diabetic chronic kidney disease: Secondary | ICD-10-CM | POA: Diagnosis not present

## 2022-06-04 DIAGNOSIS — I129 Hypertensive chronic kidney disease with stage 1 through stage 4 chronic kidney disease, or unspecified chronic kidney disease: Secondary | ICD-10-CM | POA: Diagnosis not present

## 2022-07-07 NOTE — Progress Notes (Unsigned)
Office Visit Note  Patient: Steven Knapp             Date of Birth: Oct 16, 1946           MRN: [id number]             PCP: Georgina Quint, MD Referring: Georgina Quint, * Visit Date: 07/08/2022 Occupation: @GUAROCC @  Subjective:  Right wrist pain and swelling  History of Present Illness: Steven Knapp is a 76 y.o. male with history of gout and osteoarthritis.  Patient is currently taking Uloric 40 mg daily and colchicine 0.6 mg as needed during gout flares.   CBC and CMP were drawn on 04/18/22.  Uric acid was 9.3 on 04/18/2022.  Activities of Daily Living:  Patient reports morning stiffness for *** {minute/hour:19697}.   Patient {ACTIONS;DENIES/REPORTS:21021675::"Denies"} nocturnal pain.  Difficulty dressing/grooming: {ACTIONS;DENIES/REPORTS:21021675::"Denies"} Difficulty climbing stairs: {ACTIONS;DENIES/REPORTS:21021675::"Denies"} Difficulty getting out of chair: {ACTIONS;DENIES/REPORTS:21021675::"Denies"} Difficulty using hands for taps, buttons, cutlery, and/or writing: {ACTIONS;DENIES/REPORTS:21021675::"Denies"}  No Rheumatology ROS completed.   PMFS History:  Patient Active Problem List   Diagnosis Date Noted   Tear duct infection, left 04/07/2022   Primary osteoarthritis of both shoulders 05/24/2021   Primary osteoarthritis of both hands 05/24/2021   Primary osteoarthritis of both feet 05/24/2021   DDD (degenerative disc disease), cervical 05/24/2021   Prurigo nodularis 05/24/2021   Chronic renal impairment, stage 3a (HCC) 05/24/2021   Uncontrolled type 2 diabetes mellitus with hyperglycemia (HCC) 07/30/2020   Severe nonproliferative diabetic retinopathy of left eye, with macular edema, associated with type 2 diabetes mellitus (HCC) 02/21/2020   Severe nonproliferative diabetic retinopathy of right eye, with macular edema, associated with type 2 diabetes mellitus (HCC) 02/21/2020   Nuclear sclerotic cataract of both eyes 02/21/2020   Retinal hemorrhage of left  eye 02/21/2020   Retinal exudates and deposits 02/21/2020   Alternating exotropia 02/21/2020   Diabetes mellitus (HCC) 10/06/2019   Spinal stenosis 10/06/2019   History of gout 10/06/2019   History of rheumatoid arthritis 10/06/2019   Osteoarthritis of wrist 11/05/2018   Gout 09/16/2018    Past Medical History:  Diagnosis Date   Arthritis    Cataract    Diabetes mellitus without complication (HCC)    Spinal stenosis    per patient    Family History  Problem Relation Age of Onset   Diabetes Mother    Hypertension Mother    Diabetes Brother    09/18/2018    Healthy Daughter    Healthy Daughter    Healthy Son    Healthy Son    Scoliosis Son    No past surgical history on file. Social History   Social History Narrative   Not on file   Immunization History  Administered Date(s) Administered   Fluad Quad(high Dose 65+) 07/30/2020   Moderna Sars-Covid-2 Vaccination 11/28/2019, 12/29/2019, 07/18/2020   PNEUMOCOCCAL CONJUGATE-20 05/06/2022   Pneumococcal Polysaccharide-23 10/06/2019   Zoster Recombinat (Shingrix) 05/06/2022     Objective: Vital Signs: There were no vitals taken for this visit.   Physical Exam Vitals and nursing note reviewed.  Constitutional:      Appearance: He is well-developed.  HENT:     Head: Normocephalic and atraumatic.  Eyes:     Conjunctiva/sclera: Conjunctivae normal.     Pupils: Pupils are equal, round, and reactive to light.  Cardiovascular:     Rate and Rhythm: Normal rate and regular rhythm.     Heart sounds: Normal heart sounds.  Pulmonary:     Effort: Pulmonary  effort is normal.     Breath sounds: Normal breath sounds.  Abdominal:     General: Bowel sounds are normal.     Palpations: Abdomen is soft.  Musculoskeletal:     Cervical back: Normal range of motion and neck supple.  Skin:    General: Skin is warm and dry.     Capillary Refill: Capillary refill takes less than 2 seconds.  Neurological:     Mental Status: He  is alert and oriented to person, place, and time.  Psychiatric:        Behavior: Behavior normal.      Musculoskeletal Exam: ***  CDAI Exam: CDAI Score: -- Patient Global: --; Provider Global: -- Swollen: --; Tender: -- Joint Exam 07/08/2022   No joint exam has been documented for this visit   There is currently no information documented on the homunculus. Go to the Rheumatology activity and complete the homunculus joint exam.  Investigation: No additional findings.  Imaging: No results found.  Recent Labs: Lab Results  Component Value Date   WBC 6.3 04/18/2022   HGB 12.8 (L) 04/18/2022   PLT 251 04/18/2022   NA 138 04/18/2022   K 4.4 04/18/2022   CL 105 04/18/2022   CO2 25 04/18/2022   GLUCOSE 170 (H) 04/18/2022   BUN 30 (H) 04/18/2022   CREATININE 1.52 (H) 04/18/2022   BILITOT 0.4 04/18/2022   ALKPHOS 65 01/28/2021   AST 18 04/18/2022   ALT 10 04/18/2022   PROT 7.3 04/18/2022   ALBUMIN 4.0 01/28/2021   CALCIUM 9.6 04/18/2022   GFRAA 45 (L) 04/30/2021    Speciality Comments: Allopurinol-nausea  Procedures:  No procedures performed Allergies: Other and Shellfish allergy   Assessment / Plan:     Visit Diagnoses: No diagnosis found.  Orders: No orders of the defined types were placed in this encounter.  No orders of the defined types were placed in this encounter.   Face-to-face time spent with patient was *** minutes. Greater than 50% of time was spent in counseling and coordination of care.  Follow-Up Instructions: No follow-ups on file.   Ellen Henri, CMA  Note - This record has been created using Animal nutritionist.  Chart creation errors have been sought, but may not always  have been located. Such creation errors do not reflect on  the standard of medical care.

## 2022-07-08 ENCOUNTER — Encounter: Payer: Self-pay | Admitting: Physician Assistant

## 2022-07-08 ENCOUNTER — Ambulatory Visit (INDEPENDENT_AMBULATORY_CARE_PROVIDER_SITE_OTHER): Payer: No Typology Code available for payment source

## 2022-07-08 ENCOUNTER — Ambulatory Visit: Payer: No Typology Code available for payment source | Attending: Physician Assistant | Admitting: Physician Assistant

## 2022-07-08 VITALS — BP 115/74 | HR 66 | Resp 15 | Ht 73.5 in | Wt 203.2 lb

## 2022-07-08 DIAGNOSIS — Z5181 Encounter for therapeutic drug level monitoring: Secondary | ICD-10-CM

## 2022-07-08 DIAGNOSIS — H2513 Age-related nuclear cataract, bilateral: Secondary | ICD-10-CM

## 2022-07-08 DIAGNOSIS — M19011 Primary osteoarthritis, right shoulder: Secondary | ICD-10-CM | POA: Diagnosis not present

## 2022-07-08 DIAGNOSIS — Z789 Other specified health status: Secondary | ICD-10-CM

## 2022-07-08 DIAGNOSIS — E790 Hyperuricemia without signs of inflammatory arthritis and tophaceous disease: Secondary | ICD-10-CM | POA: Diagnosis not present

## 2022-07-08 DIAGNOSIS — M19071 Primary osteoarthritis, right ankle and foot: Secondary | ICD-10-CM

## 2022-07-08 DIAGNOSIS — M5030 Other cervical disc degeneration, unspecified cervical region: Secondary | ICD-10-CM

## 2022-07-08 DIAGNOSIS — M19041 Primary osteoarthritis, right hand: Secondary | ICD-10-CM

## 2022-07-08 DIAGNOSIS — M79641 Pain in right hand: Secondary | ICD-10-CM

## 2022-07-08 DIAGNOSIS — M720 Palmar fascial fibromatosis [Dupuytren]: Secondary | ICD-10-CM | POA: Diagnosis not present

## 2022-07-08 DIAGNOSIS — M19012 Primary osteoarthritis, left shoulder: Secondary | ICD-10-CM

## 2022-07-08 DIAGNOSIS — E1165 Type 2 diabetes mellitus with hyperglycemia: Secondary | ICD-10-CM | POA: Diagnosis not present

## 2022-07-08 DIAGNOSIS — M19042 Primary osteoarthritis, left hand: Secondary | ICD-10-CM

## 2022-07-08 DIAGNOSIS — N1831 Chronic kidney disease, stage 3a: Secondary | ICD-10-CM

## 2022-07-08 DIAGNOSIS — E113413 Type 2 diabetes mellitus with severe nonproliferative diabetic retinopathy with macular edema, bilateral: Secondary | ICD-10-CM | POA: Diagnosis not present

## 2022-07-08 DIAGNOSIS — M79642 Pain in left hand: Secondary | ICD-10-CM | POA: Diagnosis not present

## 2022-07-08 DIAGNOSIS — L281 Prurigo nodularis: Secondary | ICD-10-CM | POA: Diagnosis not present

## 2022-07-08 DIAGNOSIS — R768 Other specified abnormal immunological findings in serum: Secondary | ICD-10-CM | POA: Diagnosis not present

## 2022-07-08 DIAGNOSIS — M1A09X0 Idiopathic chronic gout, multiple sites, without tophus (tophi): Secondary | ICD-10-CM

## 2022-07-08 DIAGNOSIS — M19072 Primary osteoarthritis, left ankle and foot: Secondary | ICD-10-CM

## 2022-07-08 MED ORDER — PREDNISONE 5 MG PO TABS
ORAL_TABLET | ORAL | 0 refills | Status: DC
Start: 2022-07-08 — End: 2022-08-11

## 2022-07-08 MED ORDER — PREDNISONE 5 MG PO TABS
ORAL_TABLET | ORAL | 0 refills | Status: DC
Start: 2022-07-08 — End: 2022-07-08

## 2022-07-08 MED ORDER — FEBUXOSTAT 40 MG PO TABS: 40.0000 mg | ORAL_TABLET | Freq: Every day | ORAL | 0 refills | Status: DC

## 2022-07-08 NOTE — Progress Notes (Signed)
X-rays of both hands are consistent with inflammatory arthritis changes-likely due to gouty arthropathy.   Ulnar styloid erosions noted in right wrist-concern for possible RA.   Please notify the patient.

## 2022-07-08 NOTE — Patient Instructions (Signed)

## 2022-07-09 LAB — CBC WITH DIFFERENTIAL/PLATELET
Absolute Monocytes: 755 cells/uL (ref 200–950)
Basophils Absolute: 27 cells/uL (ref 0–200)
Basophils Relative: 0.3 %
Eosinophils Absolute: 91 cells/uL (ref 15–500)
Eosinophils Relative: 1 %
HCT: 39.1 % (ref 38.5–50.0)
Hemoglobin: 13 g/dL — ABNORMAL LOW (ref 13.2–17.1)
Lymphs Abs: 1219 cells/uL (ref 850–3900)
MCH: 28.9 pg (ref 27.0–33.0)
MCHC: 33.2 g/dL (ref 32.0–36.0)
MCV: 86.9 fL (ref 80.0–100.0)
MPV: 11.2 fL (ref 7.5–12.5)
Monocytes Relative: 8.3 %
Neutro Abs: 7007 cells/uL (ref 1500–7800)
Neutrophils Relative %: 77 %
Platelets: 328 10*3/uL (ref 140–400)
RBC: 4.5 10*6/uL (ref 4.20–5.80)
RDW: 14.1 % (ref 11.0–15.0)
Total Lymphocyte: 13.4 %
WBC: 9.1 10*3/uL (ref 3.8–10.8)

## 2022-07-09 LAB — COMPLETE METABOLIC PANEL WITH GFR
AG Ratio: 1.1 (calc) (ref 1.0–2.5)
ALT: 12 U/L (ref 9–46)
AST: 18 U/L (ref 10–35)
Albumin: 4.1 g/dL (ref 3.6–5.1)
Alkaline phosphatase (APISO): 62 U/L (ref 35–144)
BUN/Creatinine Ratio: 17 (calc) (ref 6–22)
BUN: 30 mg/dL — ABNORMAL HIGH (ref 7–25)
CO2: 23 mmol/L (ref 20–32)
Calcium: 10.1 mg/dL (ref 8.6–10.3)
Chloride: 102 mmol/L (ref 98–110)
Creat: 1.81 mg/dL — ABNORMAL HIGH (ref 0.70–1.28)
Globulin: 3.9 g/dL (calc) — ABNORMAL HIGH (ref 1.9–3.7)
Glucose, Bld: 156 mg/dL — ABNORMAL HIGH (ref 65–99)
Potassium: 4.7 mmol/L (ref 3.5–5.3)
Sodium: 138 mmol/L (ref 135–146)
Total Bilirubin: 0.5 mg/dL (ref 0.2–1.2)
Total Protein: 8 g/dL (ref 6.1–8.1)
eGFR: 38 mL/min/{1.73_m2} — ABNORMAL LOW (ref >=60)

## 2022-07-09 LAB — URIC ACID: Uric Acid, Serum: 7 mg/dL (ref 4.0–8.0)

## 2022-07-09 NOTE — Progress Notes (Signed)
CBC stable.  Creatinine remains elevated but has trended up: 1.81 and GFR is low 38.   Glucose elevated-156.  Uric acid 7.0.   Patient was advised at his OV yesterday to discontinue allopurinol and restart uloric 40 mg daily.  Please clarify if he picked up the refilled uloric prescription?   Please forward labs to his nephrologist.

## 2022-07-11 DIAGNOSIS — N1831 Chronic kidney disease, stage 3a: Secondary | ICD-10-CM | POA: Diagnosis not present

## 2022-07-11 DIAGNOSIS — L2089 Other atopic dermatitis: Secondary | ICD-10-CM | POA: Diagnosis not present

## 2022-07-11 DIAGNOSIS — I10 Essential (primary) hypertension: Secondary | ICD-10-CM | POA: Diagnosis not present

## 2022-07-18 IMAGING — US US RENAL
1 series · 14 of 25 positions shown · non-contrast
Comparison: None.

CLINICAL DATA: Stage 3 chronic kidney disease

EXAM:
RENAL / URINARY TRACT ULTRASOUND COMPLETE

[Series 1: us renal · 0.23mm/px · 14 of 38 slices shown]
[im 1/38]
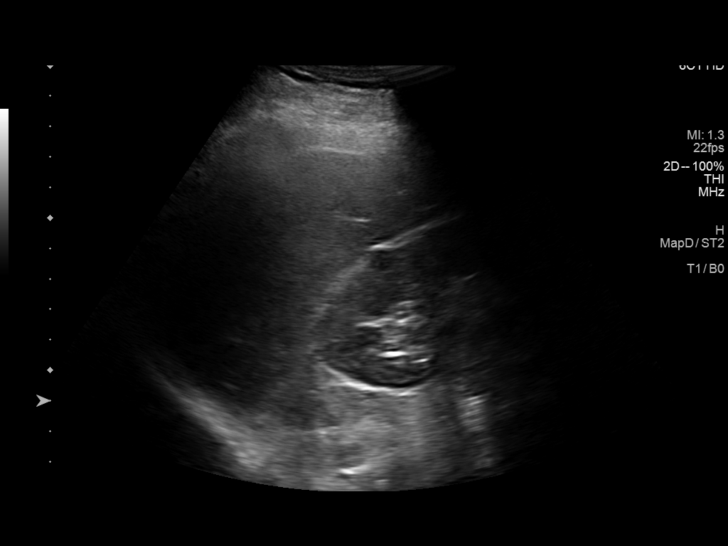
[im 4/38]
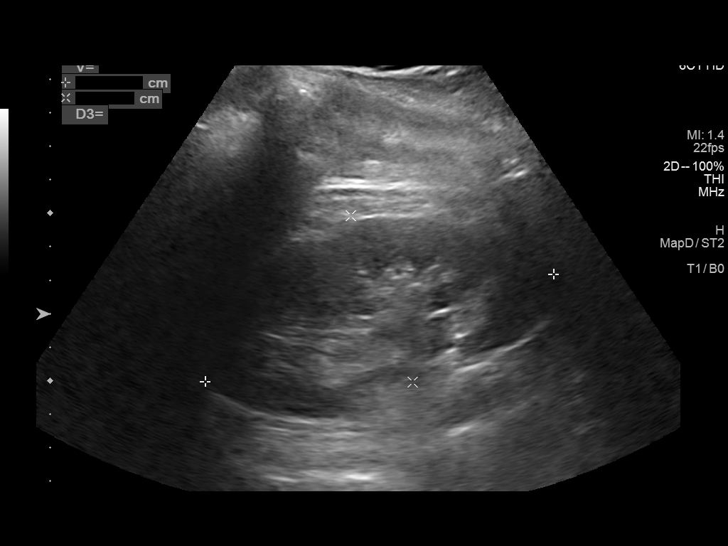
[im 7/38]
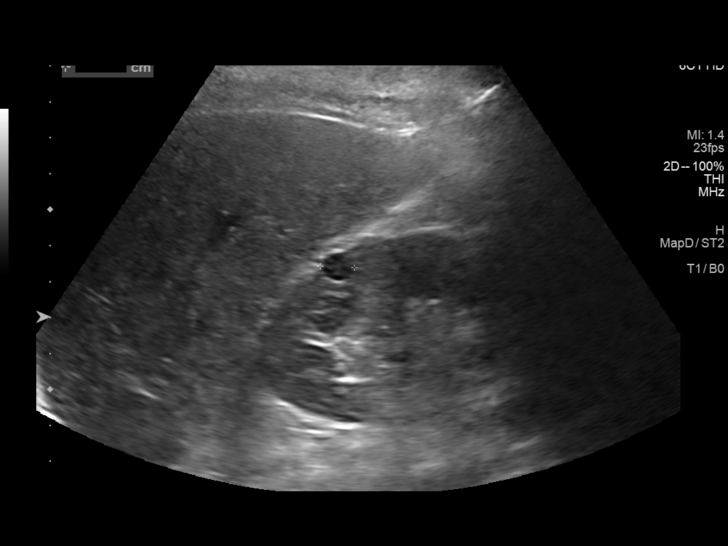
[im 10/38]
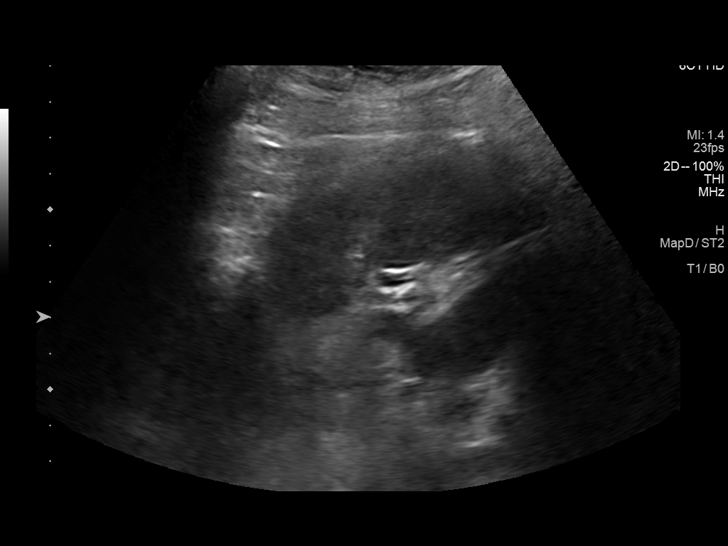
[im 13/38]
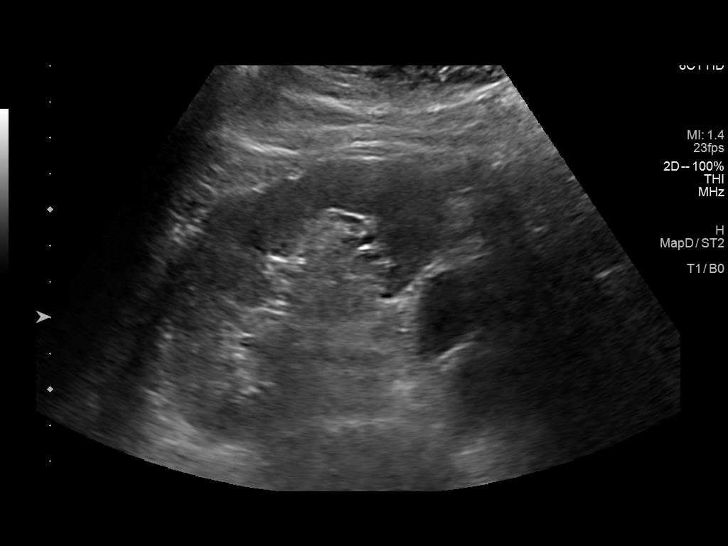
[im 14/38]
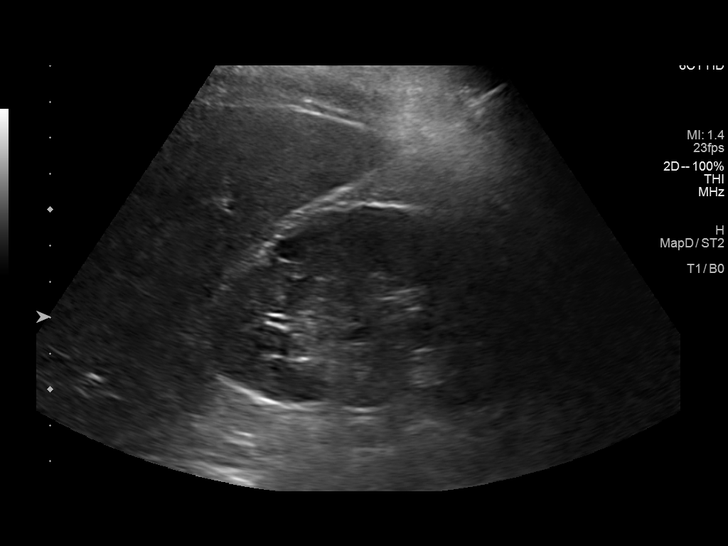
[im 17/38]
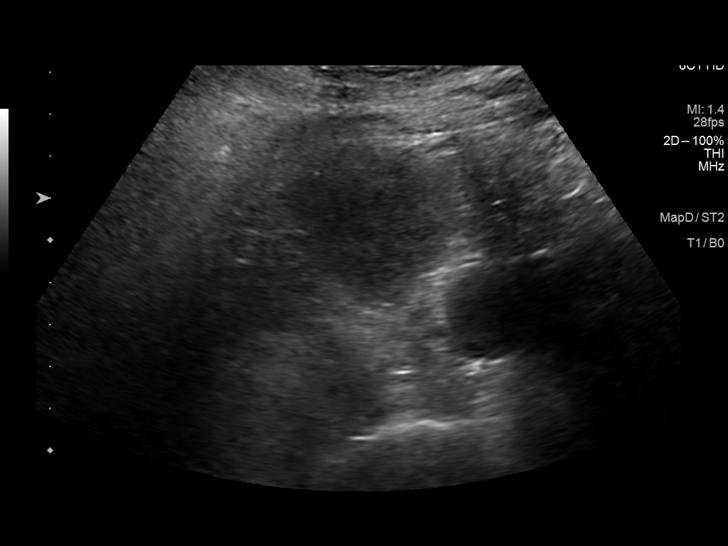
[im 21/38]
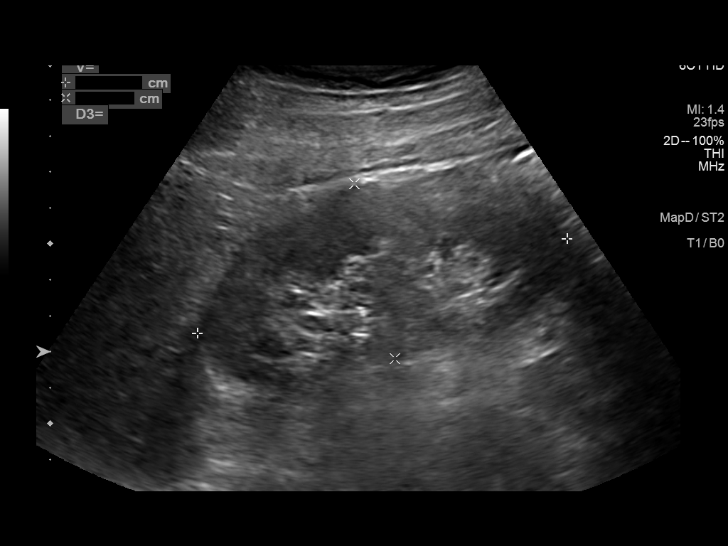
[im 24/38]
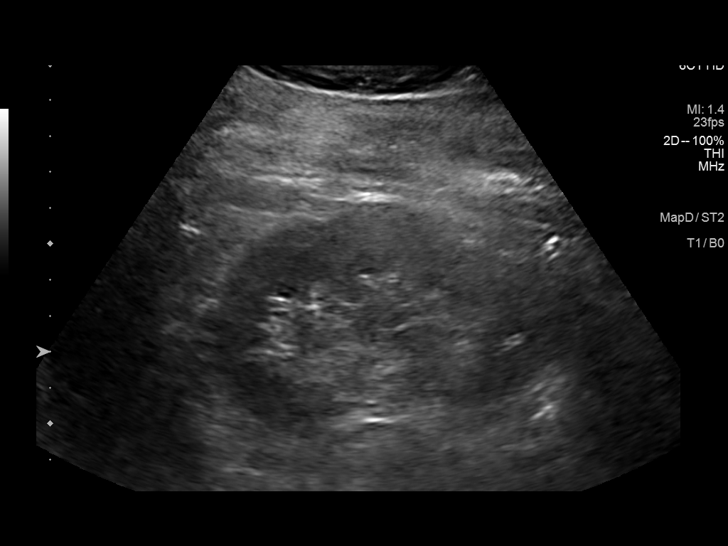
[im 25/38]
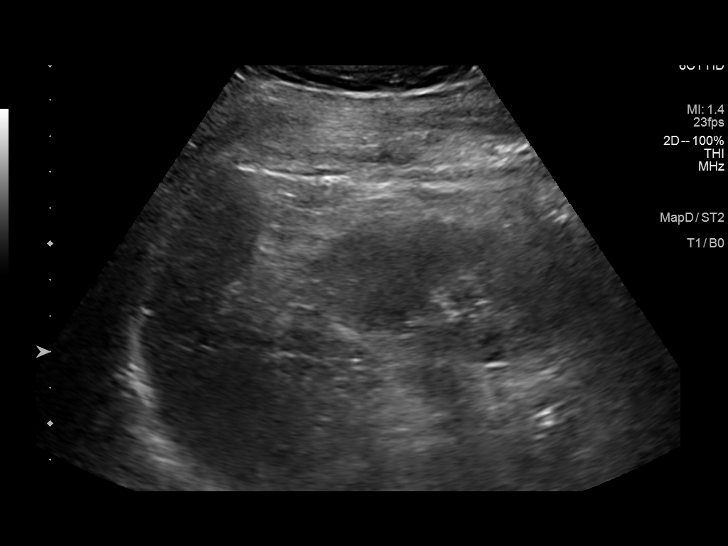
[im 28/38]
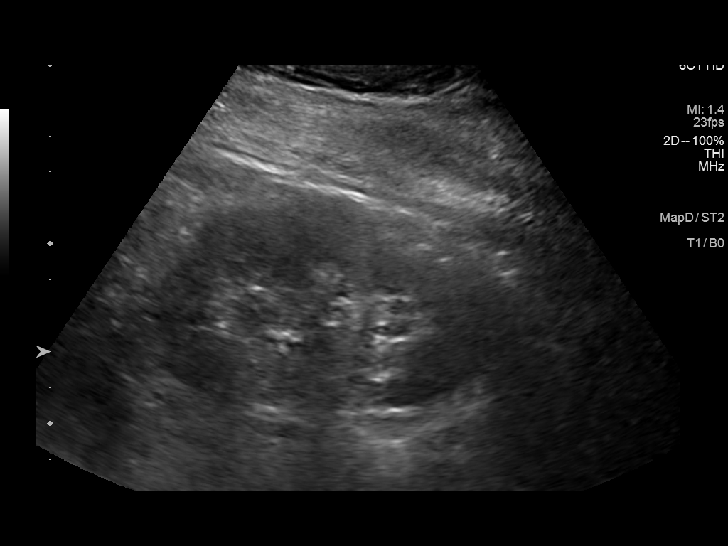
[im 31/38]
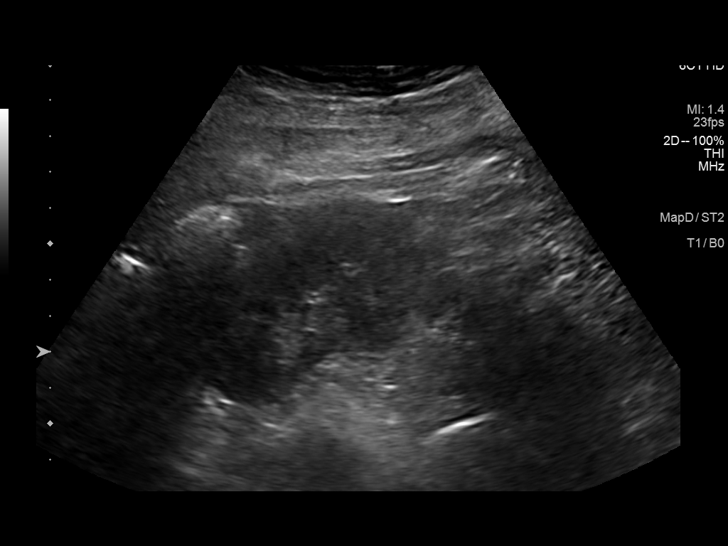
[im 34/38]
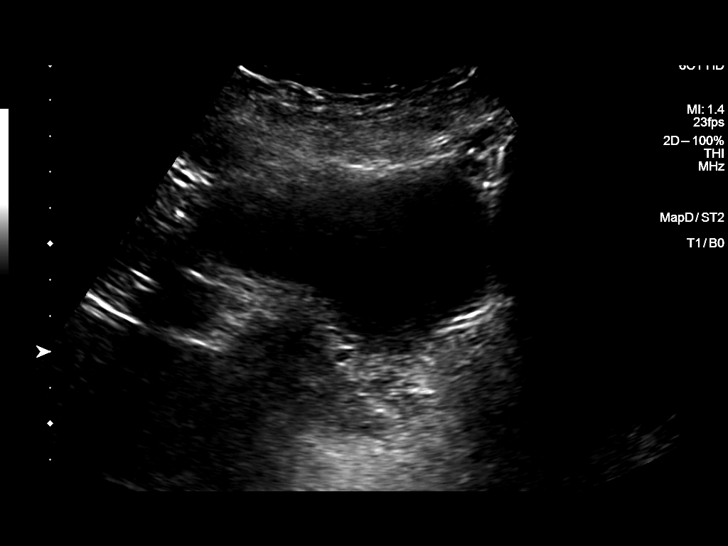
[im 38/38]
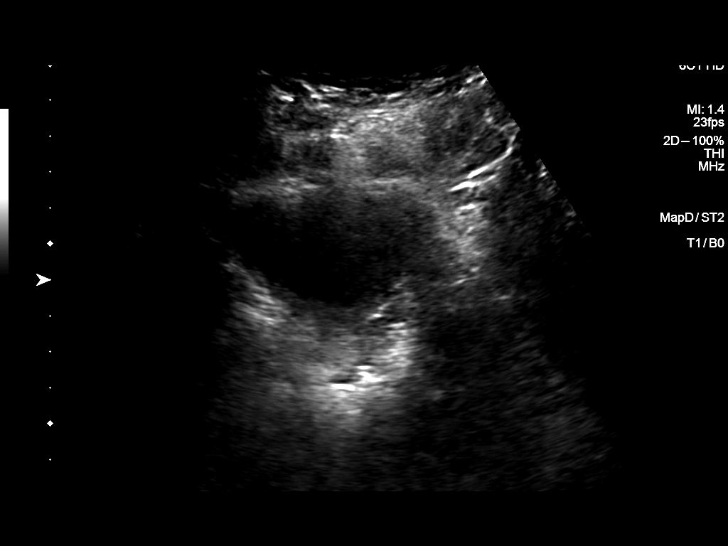

[14 of 25 positions shown; findings below may reference images not displayed]

FINDINGS: Right Kidney:

Renal measurements: 10.9 x 5.3 x 5.7 cm = volume: 171 mL .
Echogenicity within normal limits. No mass or hydronephrosis
visualized. 10 mm simple cyst noted within the upper pole.

Left Kidney:

Renal measurements: 10.6 x 5.0 x 5.6 cm = volume: 153 mL.
Echogenicity within normal limits. No mass or hydronephrosis
visualized.

Bladder:

Appears normal for degree of bladder distention.

Other:

None.
IMPRESSION: Normal renal sonogram

## 2022-07-25 DIAGNOSIS — L2089 Other atopic dermatitis: Secondary | ICD-10-CM | POA: Diagnosis not present

## 2022-08-11 ENCOUNTER — Other Ambulatory Visit: Payer: Self-pay | Admitting: *Deleted

## 2022-08-11 ENCOUNTER — Ambulatory Visit (INDEPENDENT_AMBULATORY_CARE_PROVIDER_SITE_OTHER): Payer: No Typology Code available for payment source | Admitting: Ophthalmology

## 2022-08-11 ENCOUNTER — Encounter (INDEPENDENT_AMBULATORY_CARE_PROVIDER_SITE_OTHER): Payer: Self-pay | Admitting: Ophthalmology

## 2022-08-11 ENCOUNTER — Encounter (INDEPENDENT_AMBULATORY_CARE_PROVIDER_SITE_OTHER): Payer: No Typology Code available for payment source | Admitting: Ophthalmology

## 2022-08-11 DIAGNOSIS — E113413 Type 2 diabetes mellitus with severe nonproliferative diabetic retinopathy with macular edema, bilateral: Secondary | ICD-10-CM

## 2022-08-11 DIAGNOSIS — E113412 Type 2 diabetes mellitus with severe nonproliferative diabetic retinopathy with macular edema, left eye: Secondary | ICD-10-CM

## 2022-08-11 DIAGNOSIS — H5015 Alternating exotropia: Secondary | ICD-10-CM | POA: Diagnosis not present

## 2022-08-11 DIAGNOSIS — H2513 Age-related nuclear cataract, bilateral: Secondary | ICD-10-CM | POA: Diagnosis not present

## 2022-08-11 DIAGNOSIS — E113411 Type 2 diabetes mellitus with severe nonproliferative diabetic retinopathy with macular edema, right eye: Secondary | ICD-10-CM

## 2022-08-11 MED ORDER — PREDNISONE 5 MG PO TABS: ORAL_TABLET | ORAL | 0 refills | Status: DC

## 2022-08-11 NOTE — Assessment & Plan Note (Signed)
Stable over time. 

## 2022-08-11 NOTE — Progress Notes (Signed)
08/11/2022     CHIEF COMPLAINT Patient presents for  Chief Complaint  Patient presents with   Diabetic Retinopathy with Macular Edema      HISTORY OF PRESENT ILLNESS: Steven Knapp is a 76 y.o. male who presents to the clinic today for:   HPI   6 MOS FOR DILATE OU, COLOR FP, OCT. Pt stated, "I noticed both of my eyes are a little cloudy. I think its time for my cataracts to be removed." Pt reports cloudy vision in both eyes. Pt is taking SYSTANE as needed.   Last edited by Silvestre Moment on 08/11/2022  1:59 PM.      Referring physician: Horald Pollen, [place],  [place] [address]  HISTORICAL INFORMATION:   Selected notes from the MEDICAL RECORD NUMBER    Lab Results  Component Value Date   HGBA1C 166 05/24/2022     CURRENT MEDICATIONS: Current Outpatient Medications (Ophthalmic Drugs)  Medication Sig   BEPREVE 1.5 % SOLN 1 drop 2 (two) times daily.   SYSTANE ULTRA 0.4-0.3 % SOLN SMARTSIG:1 Drop(s) In Eye(s) As Needed   No current facility-administered medications for this visit. (Ophthalmic Drugs)   Current Outpatient Medications (Other)  Medication Sig   amoxicillin-clavulanate (AUGMENTIN) 875-125 MG tablet Take 1 tablet by mouth 2 (two) times daily. (Patient not taking: Reported on 07/08/2022)   Arginine 500 MG CAPS Take by mouth daily.   blood glucose meter kit and supplies Dispense based on patient and insurance preference. Use up to four times daily as directed. (FOR ICD-10 E10.9, E11.9).   clobetasol cream (TEMOVATE) 4.56 % Apply 1 application topically 2 (two) times daily. (Patient not taking: Reported on 07/08/2022)   colchicine 0.6 MG tablet Take 1 tablet (0.6 mg total) by mouth daily as needed.   Continuous Blood Gluc Sensor (FREESTYLE LIBRE SENSOR SYSTEM) MISC 1 Device by Does not apply route daily. (Patient not taking: Reported on 07/08/2022)   dapagliflozin propanediol (FARXIGA) 10 MG TABS tablet Take 1 tablet (10 mg total) by mouth  daily before breakfast. (Patient not taking: Reported on 07/08/2022)   Dupilumab ([place] ) Inject into the skin. (Patient not taking: Reported on 07/08/2022)   febuxostat (ULORIC) 40 MG tablet Take 1 tablet (40 mg total) by mouth daily. With food   glipiZIDE (GLUCOTROL) 5 MG tablet Take 1 tablet (5 mg total) by mouth 2 (two) times daily before a meal.   ibuprofen (ADVIL) 200 MG tablet Take 200 mg by mouth every 6 (six) hours as needed for moderate pain.   Lactobacillus (PROBIOTIC ACIDOPHILUS PO) Take 1 capsule by mouth as needed.   Multiple Vitamins-Minerals (ZINC PO) Take by mouth as needed.   predniSONE (DELTASONE) 5 MG tablet Take 2 tablets by mouth daily x2 days, 1.5 tablets daily x2 days, 1 tablet daily x2 days, 0.5 tablet daily x 2 days.   rosuvastatin (CRESTOR) 10 MG tablet Take 1 tablet (10 mg total) by mouth daily. (Patient not taking: Reported on 05/06/2022)   sildenafil (VIAGRA) 100 MG tablet TAKE 1/2 TO ONE TABLET BY MOUTH DAILY AS NEEDED FOR ERECTILE DYSFUNCTION   No current facility-administered medications for this visit. (Other)      REVIEW OF SYSTEMS: ROS   Negative for: Constitutional, Gastrointestinal, Neurological, Skin, Genitourinary, Musculoskeletal, HENT, Endocrine, Cardiovascular, Eyes, Respiratory, Psychiatric, Allergic/Imm, Heme/Lymph Last edited by Silvestre Moment on 08/11/2022  1:59 PM.       ALLERGIES Allergies  Allergen Reactions   Other Hives and Itching  Peanuts, strawberries, pickles, wine, and corn   Shellfish Allergy     PAST MEDICAL HISTORY Past Medical History:  Diagnosis Date   Arthritis    Cataract    Diabetes mellitus without complication ([place])    Spinal stenosis    per patient   History reviewed. No pertinent surgical history.  FAMILY HISTORY Family History  Problem Relation Age of Onset   Diabetes Mother    Hypertension Mother    Diabetes Brother    Therapist, occupational Daughter    Healthy Daughter    Healthy Son     Healthy Son    Scoliosis Son     SOCIAL HISTORY Social History   Tobacco Use   Smoking status: Former    Years: 4.00    Types: Cigarettes    Passive exposure: Past   Smokeless tobacco: Never  Vaping Use   Vaping Use: Never used  Substance Use Topics   Alcohol use: Yes    Alcohol/week: 3.0 standard drinks of alcohol    Types: 3 Standard drinks or equivalent per week    Comment: 2-3 times weekly   Drug use: Never         OPHTHALMIC EXAM:  Base Eye Exam     Visual Acuity (ETDRS)       Right Left   Dist [place] 20/25 -1 20/25 -1         Tonometry (Tonopen, 2:03 PM)       Right Left   Pressure 14 15         Pupils       Pupils APD   Right PERRL None   Left PERRL None         Visual Fields       Left Right    Full Full         Extraocular Movement       Right Left    Full, Ortho Full, Ortho         Neuro/Psych     Oriented x3: Yes   Mood/Affect: Normal         Dilation     Both eyes: 1.0% Mydriacyl, 2.5% Phenylephrine @ 2:03 PM           Slit Lamp and Fundus Exam     External Exam       Right Left   External Normal Normal         Slit Lamp Exam       Right Left   Lids/Lashes Normal Normal   Conjunctiva/Sclera White and quiet White and quiet   Cornea Clear Clear   Anterior Chamber Deep and quiet Deep and quiet   Iris Round and reactive Round and reactive   Lens 2+ Nuclear sclerosis 2+ Nuclear sclerosis   Anterior Vitreous Normal Normal         Fundus Exam       Right Left   Posterior Vitreous Normal Posterior vitreous detachment, Central vitreous floaters   Disc Normal Normal   C/D Ratio 0.5 0.5   Macula Mild clinically significant macular edema, thickening superior to the fovea Mild clinically significant macular edema, Exudates, Microaneurysms   Vessels NPDR-Severe NPDR severe   Periphery Normal Normal            IMAGING AND PROCEDURES  Imaging and Procedures for 08/11/22  OCT, Retina - OU - Both  Eyes       Right Eye Quality was borderline. Scan locations included subfoveal.  Central Foveal Thickness: 348. Progression has been stable. Findings include abnormal foveal contour.   Left Eye Quality was borderline. Scan locations included subfoveal. Central Foveal Thickness: 363. Progression has worsened. Findings include abnormal foveal contour, cystoid macular edema.   Notes OD able CSME inferotemporal to FAZ, not in region of previous focal laser we will continue to monitor and observe this as it is approaching center involvement yet with good acuity can still observe  OS with prior center involved CSME, OS improved post focal laser with juxta foveal CSME developing OS.  Will need injection intravitreal Avastin promptly     Color Fundus Photography Optos - OU - Both Eyes       Right Eye Progression has no prior data. Disc findings include normal observations. Macula : microaneurysms. Vessels : normal observations. Periphery : normal observations.   Left Eye Progression has no prior data. Disc findings include normal observations. Macula : edema, microaneurysms. Vessels : normal observations. Periphery : normal observations.   Notes Mild to moderate NPDR OU with some vitreous debris, from vitreous floaters with new active CSME OS.             ASSESSMENT/PLAN:  Severe nonproliferative diabetic retinopathy of right eye, with macular edema, associated with type 2 diabetes mellitus (HCC) Stable OD.  Severe nonproliferative diabetic retinopathy of left eye, with macular edema, associated with type 2 diabetes mellitus ([place]) OS center involved CSME will need to commence with Avastin with precertification to follow  Nuclear sclerotic cataract of both eyes Progression OU.  Starting to impact acuity  Alternating exotropia Stable over time     ICD-10-CM   1. Severe nonproliferative diabetic retinopathy of left eye, with macular edema, associated with type 2 diabetes  mellitus (HCC)  Y07.3710 OCT, Retina - OU - Both Eyes    Color Fundus Photography Optos - OU - Both Eyes    2. Severe nonproliferative diabetic retinopathy of right eye, with macular edema, associated with type 2 diabetes mellitus ([place])  E11.3411     3. Nuclear sclerotic cataract of both eyes  H25.13     4. Alternating exotropia  H50.15       1.  OS center involved CSME is developed with the symptomatic visual blurring.  We will need to commence therapy with Avastin.  Insurance mandates precertification  2.  No dilate OU next, consent for Avastin OS  3.  Ophthalmic Meds Ordered this visit:  No orders of the defined types were placed in this encounter.      Return in about 2 days (around 08/13/2022) for NO DILATE, OS, AVASTIN OCT, with pre-CERT.  There are no Patient Instructions on file for this visit.   Explained the diagnoses, plan, and follow up with the patient and they expressed understanding.  Patient expressed understanding of the importance of proper follow up care.   Clent Demark [place] M.D. Diseases & Surgery of the Retina and Vitreous [facility]ina [facility] [place] 08/11/22     Abbreviations: M myopia (nearsighted); A astigmatism; H hyperopia (farsighted); P presbyopia; Mrx spectacle prescription;  CTL contact lenses; OD right eye; OS left eye; OU both eyes  XT exotropia; ET esotropia; PEK punctate epithelial keratitis; PEE punctate epithelial erosions; DES dry eye syndrome; MGD meibomian gland dysfunction; ATs artificial tears; PFAT's preservative free artificial tears; [place] nuclear sclerotic cataract; PSC posterior subcapsular cataract; ERM epi-retinal membrane; PVD posterior vitreous detachment; RD retinal detachment; DM diabetes mellitus; DR diabetic retinopathy; NPDR non-proliferative diabetic retinopathy; PDR proliferative diabetic retinopathy;  CSME clinically significant macular edema; DME diabetic macular edema; dbh dot blot hemorrhages; CWS cotton wool spot; POAG  primary open angle glaucoma; C/D cup-to-disc ratio; HVF humphrey visual field; GVF goldmann visual field; OCT optical coherence tomography; IOP intraocular pressure; BRVO Branch retinal vein occlusion; CRVO central retinal vein occlusion; CRAO central retinal artery occlusion; BRAO branch retinal artery occlusion; RT retinal tear; SB scleral buckle; PPV pars plana vitrectomy; VH Vitreous hemorrhage; PRP panretinal laser photocoagulation; IVK intravitreal kenalog; VMT vitreomacular traction; MH Macular hole;  NVD neovascularization of the disc; NVE neovascularization elsewhere; AREDS age related eye disease study; ARMD age related macular degeneration; POAG primary open angle glaucoma; EBMD epithelial/anterior basement membrane dystrophy; ACIOL anterior chamber intraocular lens; IOL intraocular lens; PCIOL posterior chamber intraocular lens; Phaco/IOL phacoemulsification with intraocular lens placement; [place] photorefractive keratectomy; LASIK laser assisted in situ keratomileusis; HTN hypertension; DM diabetes mellitus; COPD chronic obstructive pulmonary disease

## 2022-08-11 NOTE — Assessment & Plan Note (Signed)
Stable OD °

## 2022-08-11 NOTE — Telephone Encounter (Signed)
Patient called requesting prednisone refill, patient has right wrist pain, swelling, pain x 2 days. Please advise.  Next Visit: 09/17/2022  Last Visit: 07/08/2022  Last Fill: 07/08/2022  Dx: Idiopathic chronic gout of multiple sites without tophus   Labs:  07/08/2022 CBC stable.  Creatinine remains elevated but has trended up: 1.81 and GFR is low 38.   Glucose elevated-156.  Uric acid 7.0.   Patient was advised at his OV yesterday to discontinue allopurinol and restart uloric 40 mg daily.  Please clarify if he picked up the refilled uloric prescription?    Please forward labs to his nephrologist.   Current Dose per office note on 07/08/2022: short course of prednisone starting at 10 mg tapering by 2.5 mg every 2 day to treat his current flare.   Okay to refill prednisone?

## 2022-08-11 NOTE — Assessment & Plan Note (Signed)
OS center involved CSME will need to commence with Avastin with precertification to follow

## 2022-08-11 NOTE — Assessment & Plan Note (Signed)
Progression OU.  Starting to impact acuity

## 2022-08-12 DIAGNOSIS — L2089 Other atopic dermatitis: Secondary | ICD-10-CM | POA: Diagnosis not present

## 2022-08-14 ENCOUNTER — Encounter (INDEPENDENT_AMBULATORY_CARE_PROVIDER_SITE_OTHER): Payer: Self-pay | Admitting: Ophthalmology

## 2022-08-14 ENCOUNTER — Encounter (INDEPENDENT_AMBULATORY_CARE_PROVIDER_SITE_OTHER): Payer: No Typology Code available for payment source | Admitting: Ophthalmology

## 2022-08-14 ENCOUNTER — Ambulatory Visit (INDEPENDENT_AMBULATORY_CARE_PROVIDER_SITE_OTHER): Payer: No Typology Code available for payment source | Admitting: Ophthalmology

## 2022-08-14 DIAGNOSIS — E113412 Type 2 diabetes mellitus with severe nonproliferative diabetic retinopathy with macular edema, left eye: Secondary | ICD-10-CM | POA: Diagnosis not present

## 2022-08-14 MED ORDER — BEVACIZUMAB CHEMO INJECTION 1.25MG/0.05ML SYRINGE FOR KALEIDOSCOPE
2.5000 mg | INTRAVITREAL | Status: AC | PRN
  Administered 2022-08-14: 2.5 mg via INTRAVITREAL

## 2022-08-14 NOTE — Patient Instructions (Signed)
Patient instructed to call 2 weeks prior to next injection to confirm participation in credentialing complete with [employer]

## 2022-08-14 NOTE — Assessment & Plan Note (Signed)
Recent worsening of CSME commencement of therapy today post certification approval.  Injection Avastin OS today follow-up next in 7 weeks

## 2022-08-14 NOTE — Progress Notes (Signed)
08/14/2022     CHIEF COMPLAINT Patient presents for  Chief Complaint  Patient presents with   Diabetic Retinopathy with Macular Edema      HISTORY OF PRESENT ILLNESS: Steven Knapp is a 76 y.o. male who presents to the clinic today for:   HPI   3 days for NO DILATE, OS, AVASTIN OCT.Marland Kitchen WITH PRECERT. Pt stated no changes in vision.  Last edited by Silvestre Moment on 08/14/2022  2:18 PM.      Referring physician: Horald Pollen, [place],  [place] [address]  HISTORICAL INFORMATION:   Selected notes from the MEDICAL RECORD NUMBER    Lab Results  Component Value Date   HGBA1C 166 05/24/2022     CURRENT MEDICATIONS: Current Outpatient Medications (Ophthalmic Drugs)  Medication Sig   BEPREVE 1.5 % SOLN 1 drop 2 (two) times daily.   SYSTANE ULTRA 0.4-0.3 % SOLN SMARTSIG:1 Drop(s) In Eye(s) As Needed   No current facility-administered medications for this visit. (Ophthalmic Drugs)   Current Outpatient Medications (Other)  Medication Sig   amoxicillin-clavulanate (AUGMENTIN) 875-125 MG tablet Take 1 tablet by mouth 2 (two) times daily. (Patient not taking: Reported on 07/08/2022)   Arginine 500 MG CAPS Take by mouth daily.   blood glucose meter kit and supplies Dispense based on patient and insurance preference. Use up to four times daily as directed. (FOR ICD-10 E10.9, E11.9).   clobetasol cream (TEMOVATE) 3.53 % Apply 1 application topically 2 (two) times daily. (Patient not taking: Reported on 07/08/2022)   colchicine 0.6 MG tablet Take 1 tablet (0.6 mg total) by mouth daily as needed.   Continuous Blood Gluc Sensor (FREESTYLE LIBRE SENSOR SYSTEM) MISC 1 Device by Does not apply route daily. (Patient not taking: Reported on 07/08/2022)   dapagliflozin propanediol (FARXIGA) 10 MG TABS tablet Take 1 tablet (10 mg total) by mouth daily before breakfast. (Patient not taking: Reported on 07/08/2022)   Dupilumab ([place]) Inject into the skin. (Patient not  taking: Reported on 07/08/2022)   febuxostat (ULORIC) 40 MG tablet Take 1 tablet (40 mg total) by mouth daily. With food   glipiZIDE (GLUCOTROL) 5 MG tablet Take 1 tablet (5 mg total) by mouth 2 (two) times daily before a meal.   ibuprofen (ADVIL) 200 MG tablet Take 200 mg by mouth every 6 (six) hours as needed for moderate pain.   Lactobacillus (PROBIOTIC ACIDOPHILUS PO) Take 1 capsule by mouth as needed.   Multiple Vitamins-Minerals (ZINC PO) Take by mouth as needed.   predniSONE (DELTASONE) 5 MG tablet Take 2 tablets by mouth daily x2 days, 1.5 tablets daily x2 days, 1 tablet daily x2 days, 0.5 tablet daily x 2 days.   rosuvastatin (CRESTOR) 10 MG tablet Take 1 tablet (10 mg total) by mouth daily. (Patient not taking: Reported on 05/06/2022)   sildenafil (VIAGRA) 100 MG tablet TAKE 1/2 TO ONE TABLET BY MOUTH DAILY AS NEEDED FOR ERECTILE DYSFUNCTION   No current facility-administered medications for this visit. (Other)      REVIEW OF SYSTEMS: ROS   Negative for: Constitutional, Gastrointestinal, Neurological, Skin, Genitourinary, Musculoskeletal, HENT, Endocrine, Cardiovascular, Eyes, Respiratory, Psychiatric, Allergic/Imm, Heme/Lymph Last edited by Silvestre Moment on 08/14/2022  2:18 PM.       ALLERGIES Allergies  Allergen Reactions   Other Hives and Itching    Peanuts, strawberries, pickles, wine, and corn   Shellfish Allergy     PAST MEDICAL HISTORY Past Medical History:  Diagnosis Date   Arthritis  Cataract    Diabetes mellitus without complication ([place])    Spinal stenosis    per patient   History reviewed. No pertinent surgical history.  FAMILY HISTORY Family History  Problem Relation Age of Onset   Diabetes Mother    Hypertension Mother    Diabetes Brother    Therapist, occupational Daughter    Healthy Daughter    Healthy Son    Healthy Son    Scoliosis Son     SOCIAL HISTORY Social History   Tobacco Use   Smoking status: Former    Years: 4.00    Types:  Cigarettes    Passive exposure: Past   Smokeless tobacco: Never  Vaping Use   Vaping Use: Never used  Substance Use Topics   Alcohol use: Yes    Alcohol/week: 3.0 standard drinks of alcohol    Types: 3 Standard drinks or equivalent per week    Comment: 2-3 times weekly   Drug use: Never         OPHTHALMIC EXAM:  Base Eye Exam     Visual Acuity (ETDRS)       Right Left   Dist [place] 20/25 -2 20/30 -2   Dist ph [place]  20/25 -2         Tonometry (Tonopen, 2:22 PM)       Right Left   Pressure 15 15         Pupils       Pupils APD   Right PERRL None   Left PERRL None         Visual Fields       Left Right    Full Full         Extraocular Movement       Right Left    Full, Ortho Full, Ortho         Neuro/Psych     Oriented x3: Yes   Mood/Affect: Normal         Dilation     Both eyes: 1.0% Mydriacyl @ 2:22 PM           Slit Lamp and Fundus Exam     External Exam       Right Left   External Normal Normal         Slit Lamp Exam       Right Left   Lids/Lashes Normal Normal   Conjunctiva/Sclera White and quiet White and quiet   Cornea Clear Clear   Anterior Chamber Deep and quiet Deep and quiet   Iris Round and reactive Round and reactive   Lens 2+ Nuclear sclerosis 2+ Nuclear sclerosis   Anterior Vitreous Normal Normal            IMAGING AND PROCEDURES  Imaging and Procedures for 08/14/22  OCT, Retina - OU - Both Eyes       Right Eye Quality was borderline. Scan locations included subfoveal. Central Foveal Thickness: 341. Progression has been stable. Findings include abnormal foveal contour.   Left Eye Quality was borderline. Scan locations included subfoveal. Central Foveal Thickness: 357. Progression has been stable. Findings include abnormal foveal contour, cystoid macular edema.   Notes OD able CSME inferotemporal to FAZ, not in region of previous focal laser we will continue to monitor and observe this as it is  approaching center involvement yet with good acuity can still observe  OS with prior center involved CSME, OS improved post focal laser with juxta foveal CSME developing  OS.  Will need injection intravitreal Avastin promptly     Intravitreal Injection, Pharmacologic Agent - OS - Left Eye       Time Out 08/14/2022. 3:41 PM. Confirmed correct patient, procedure, site, and patient consented.   Anesthesia Topical anesthesia was used. Anesthetic medications included [place] 3.5%.   Procedure Preparation included 5% betadine to ocular surface, 10% betadine to eyelids, Tobramycin 0.3%, Ofloxacin . A 30 gauge needle was used.   Injection: 2.5 mg Bevacizumab 1.$RemoveBeforeDE'25mg'[id number]$ /0.27ml   Route: Intravitreal, Site: Left Eye   NDC: [id number], Lot: [id number]   Post-op Post injection exam found visual acuity of at least counting fingers. The patient tolerated the procedure well. There were no complications. The patient received written and verbal post procedure care education. Post injection medications were not given.              ASSESSMENT/PLAN:  Severe nonproliferative diabetic retinopathy of left eye, with macular edema, associated with type 2 diabetes mellitus (HCC) Recent worsening of CSME commencement of therapy today post certification approval.  Injection Avastin OS today follow-up next in 7 weeks     ICD-10-CM   1. Severe nonproliferative diabetic retinopathy of left eye, with macular edema, associated with type 2 diabetes mellitus (HCC)  R00.7622 OCT, Retina - OU - Both Eyes    Intravitreal Injection, Pharmacologic Agent - OS - Left Eye    Bevacizumab (AVASTIN) SOLN 2.5 mg      1.  OS for injection intravitreal Avastin today  2.  Repeat evaluation next in 6 to 7 weeks  3.  Ophthalmic Meds Ordered this visit:  Meds ordered this encounter  Medications   Bevacizumab (AVASTIN) SOLN 2.5 mg       Return in about 7 weeks (around 10/02/2022) for dilate, OS, AVASTIN OCT.  Patient  Instructions  Patient instructed to call 2 weeks prior to next injection to confirm participation in credentialing complete with [facility]  Explained the diagnoses, plan, and follow up with the patient and they expressed understanding.  Patient expressed understanding of the importance of proper follow up care.   Clent Demark Tessie Ordaz M.D. Diseases & Surgery of the Retina and Vitreous [facility]ina [facility] Di[facility] [place] 08/14/22     Abbreviations: M myopia (nearsighted); A astigmatism; H hyperopia (farsighted); P presbyopia; Mrx spectacle prescription;  CTL contact lenses; OD right eye; OS left eye; OU both eyes  XT exotropia; ET esotropia; PEK punctate epithelial keratitis; PEE punctate epithelial erosions; DES dry eye syndrome; MGD meibomian gland dysfunction; ATs artificial tears; PFAT's preservative free artificial tears; [place] nuclear sclerotic cataract; PSC posterior subcapsular cataract; ERM epi-retinal membrane; PVD posterior vitreous detachment; RD retinal detachment; DM diabetes mellitus; DR diabetic retinopathy; NPDR non-proliferative diabetic retinopathy; PDR proliferative diabetic retinopathy; CSME clinically significant macular edema; DME diabetic macular edema; dbh dot blot hemorrhages; CWS cotton wool spot; POAG primary open angle glaucoma; C/D cup-to-disc ratio; HVF humphrey visual field; GVF goldmann visual field; OCT optical coherence tomography; IOP intraocular pressure; BRVO Branch retinal vein occlusion; CRVO central retinal vein occlusion; CRAO central retinal artery occlusion; BRAO branch retinal artery occlusion; RT retinal tear; SB scleral buckle; PPV pars plana vitrectomy; VH Vitreous hemorrhage; PRP panretinal laser photocoagulation; IVK intravitreal kenalog; VMT vitreomacular traction; MH Macular hole;  NVD neovascularization of the disc; NVE neovascularization elsewhere; AREDS age related eye disease study; ARMD age related macular degeneration; POAG primary open angle glaucoma;  EBMD epithelial/anterior basement membrane dystrophy; ACIOL anterior chamber intraocular lens; IOL intraocular lens; PCIOL posterior chamber intraocular lens;  Phaco/IOL phacoemulsification with intraocular lens placement; [place] photorefractive keratectomy; LASIK laser assisted in situ keratomileusis; HTN hypertension; DM diabetes mellitus; COPD chronic obstructive pulmonary disease

## 2022-08-21 DIAGNOSIS — L281 Prurigo nodularis: Secondary | ICD-10-CM | POA: Diagnosis not present

## 2022-08-27 DIAGNOSIS — H40023 Open angle with borderline findings, high risk, bilateral: Secondary | ICD-10-CM | POA: Diagnosis not present

## 2022-09-04 NOTE — Progress Notes (Signed)
Office Visit Note  Patient: Steven Knapp             Date of Birth: [birth year]           MRN: [id number]             PCP: Horald Pollen, MD Referring: Horald Pollen, * Visit Date: 09/17/2022 Occupation: @GUAROCC @  Subjective:  Patient management  History of Present Illness: Bruce Windmiller is a 76 y.o. male history of gout and osteoarthritis.  He states he did not fill the prescription for Uloric.  He has been taking allopurinol 300 mg daily.  He takes colchicine only on a as needed basis.  He states he has not had a flare of gout in the last 1 month.  He is a still taking prednisone 5 mg daily.  Denies any joint pain.  Exercising and swimming at the [facility].  States he has been drinking alcohol about once or twice a week which is mostly wine.  He occasionally eats red meat.  Activities of Daily Living:  Patient reports morning stiffness for 15-20 minutes.   Patient Denies nocturnal pain.  Difficulty dressing/grooming: Denies Difficulty climbing stairs: Denies Difficulty getting out of chair: Denies Difficulty using hands for taps, buttons, cutlery, and/or writing: Denies  Review of Systems  Constitutional:  Negative for fatigue.  HENT:  Positive for trouble swallowing, trouble swallowing and mouth dryness. Negative for mouth sores.   Eyes:  Negative for dryness.  Respiratory:  Negative for shortness of breath.   Cardiovascular:  Negative for chest pain and palpitations.  Gastrointestinal:  Negative for blood in stool, constipation and diarrhea.  Endocrine: Negative for increased urination.  Genitourinary:  Negative for involuntary urination.  Musculoskeletal:  Positive for morning stiffness. Negative for joint pain, gait problem, joint pain, joint swelling, myalgias, muscle weakness, muscle tenderness and myalgias.  Skin:  Positive for rash. Negative for color change and sensitivity to sunlight.  Allergic/Immunologic: Negative for susceptible to infections.   Neurological:  Negative for dizziness and headaches.  Hematological:  Negative for swollen glands.  Psychiatric/Behavioral:  Negative for depressed mood and sleep disturbance. The patient is not nervous/anxious.     PMFS History:  Patient Active Problem List   Diagnosis Date Noted   Tear duct infection, left 04/07/2022   Primary osteoarthritis of both shoulders 05/24/2021   Primary osteoarthritis of both hands 05/24/2021   Primary osteoarthritis of both feet 05/24/2021   DDD (degenerative disc disease), cervical 05/24/2021   Prurigo nodularis 05/24/2021   Chronic renal impairment, stage 3a ([place]) 05/24/2021   Uncontrolled type 2 diabetes mellitus with hyperglycemia ([place]) 07/30/2020   Severe nonproliferative diabetic retinopathy of left eye, with macular edema, associated with type 2 diabetes mellitus ([place]) 02/21/2020   Severe nonproliferative diabetic retinopathy of right eye, with macular edema, associated with type 2 diabetes mellitus ([place]) 02/21/2020   Nuclear sclerotic cataract of both eyes 02/21/2020   Retinal hemorrhage of left eye 02/21/2020   Retinal exudates and deposits 02/21/2020   Alternating exotropia 02/21/2020   Diabetes mellitus ([place]) 10/06/2019   Spinal stenosis 10/06/2019   History of gout 10/06/2019   History of rheumatoid arthritis 10/06/2019   Osteoarthritis of wrist 11/05/2018   Gout 09/16/2018    Past Medical History:  Diagnosis Date   Arthritis    Cataract    Diabetes mellitus without complication ([place])    Spinal stenosis    per patient    Family History  Problem Relation Age of Onset  Diabetes Mother    Hypertension Mother    Diabetes Brother    Health and safety inspector    Healthy Daughter    Healthy Daughter    Healthy Son    Healthy Son    Scoliosis Son    History reviewed. No pertinent surgical history. Social History   Social History Narrative   Not on file   Immunization History  Administered Date(s) Administered   Fluad Quad(high Dose  65+) 07/30/2020   Moderna Sars-Covid-2 Vaccination 11/28/2019, 12/29/2019, 07/18/2020   PNEUMOCOCCAL CONJUGATE-20 05/06/2022   Pneumococcal Polysaccharide-23 10/06/2019   Zoster Recombinat (Shingrix) 05/06/2022     Objective: Vital Signs: BP 122/78 (BP Location: Left Arm, Patient Position: Sitting, Cuff Size: Normal)   Pulse 60   Resp 18   Ht 6\' 2"  (1.88 m)   Wt 207 lb 3.2 oz (94 kg)   BMI 26.60 kg/m    Physical Exam Vitals and nursing note reviewed.  Constitutional:      Appearance: He is well-developed.  HENT:     Head: Normocephalic and atraumatic.  Eyes:     Conjunctiva/sclera: Conjunctivae normal.     Pupils: Pupils are equal, round, and reactive to light.  Cardiovascular:     Rate and Rhythm: Normal rate and regular rhythm.     Heart sounds: Normal heart sounds.  Pulmonary:     Effort: Pulmonary effort is normal.     Breath sounds: Normal breath sounds.  Abdominal:     General: Bowel sounds are normal.     Palpations: Abdomen is soft.  Musculoskeletal:     Cervical back: Normal range of motion and neck supple.  Skin:    General: Skin is warm and dry.     Capillary Refill: Capillary refill takes less than 2 seconds.  Neurological:     Mental Status: He is alert and oriented to person, place, and time.  Psychiatric:        Behavior: Behavior normal.      Musculoskeletal Exam: He had limited rotation of the cervical spine.  Shoulder joints were in good range of motion.  He had right elbow joint contracture without synovitis.  He had bilateral PIP and DIP thickening with no synovitis.  There was no synovitis over wrist joints, MCPs PIPs or DIPs.  Hip joints and knee joints with good range of motion.  He had no tenderness over ankles or MTPs..  CDAI Exam: CDAI Score: -- Patient Global: --; Provider Global: -- Swollen: --; Tender: -- Joint Exam 09/17/2022   No joint exam has been documented for this visit   There is currently no information documented on the  homunculus. Go to the Rheumatology activity and complete the homunculus joint exam.  Investigation: No additional findings.  Imaging: No results found.  Recent Labs: Lab Results  Component Value Date   WBC 9.1 07/08/2022   HGB 13.0 (L) 07/08/2022   PLT 328 07/08/2022   NA 138 07/08/2022   K 4.7 07/08/2022   CL 102 07/08/2022   CO2 23 07/08/2022   GLUCOSE 156 (H) 07/08/2022   BUN 30 (H) 07/08/2022   CREATININE 1.81 (H) 07/08/2022   BILITOT 0.5 07/08/2022   ALKPHOS 65 01/28/2021   AST 18 07/08/2022   ALT 12 07/08/2022   PROT 8.0 07/08/2022   ALBUMIN 4.0 01/28/2021   CALCIUM 10.1 07/08/2022   GFRAA 45 (L) 04/30/2021    Speciality Comments: Allopurinol-nausea  Procedures:  No procedures performed Allergies: Other and Shellfish allergy   Assessment / Plan:  Visit Diagnoses: Idiopathic chronic gout of multiple sites without tophus -allopurinol 300 mg by mouth daily and colchicine 0.6 mg daily as needed.    Uric acid: 7.0 on 07/08/2022. -Patient states he has been taking allopurinol on a regular basis.  He did not switch to Uloric.  He states he has been doing well and did not get a gout flare at the last visit.  He does not want to switch to Uloric.  I will increase the dose of allopurinol to 300 mg, 1-1/2 tablet daily.  I advised him to take colchicine if he develops a flare.  Plan: Uric acid.  Patient still consumes red meat.  Abstinence from red meat and alcohol intake was discussed.  He states he drinks 2 glasses of wine a week.  Hyperuricemia - He has longstanding history of hyperuricemia.  Uric acid was 9.2 on August 06, 2021, 10.7 on January 17, 2022, 9.3 on 6 April 18, 2022 and 7.0 on July 08, 2022.  The goal is to keep uric acid below 6 ideally around 4.0.  Medication monitoring encounter - Plan: CBC with Differential/Platelet, COMPLETE METABOLIC PANEL WITH GFR in 1 month after increasing the dose of allopurinol.  Primary osteoarthritis of both shoulders-he had good  range of motion in his shoulders without any discomfort.  Primary osteoarthritis of both hands-he had PIP and DIP thickening with no synovitis.  Dupuytren's contracture of both hands-unsure.  Primary osteoarthritis of both feet - X-ray showed erosive changes and osteoarthritis.  He denies any discomfort in his feet today.  DDD (degenerative disc disease), cervical-he had limited range of motion without any discomfort.  Positive ANA (antinuclear antibody) - Double-stranded DNA negative, [place] negative, anti-CCP negative.  He has no clinical features of systemic lupus.  Prurigo nodularis - Injections by Dr. Particia Nearing.   Uncontrolled type 2 diabetes mellitus with hyperglycemia (HCC)  Chronic renal impairment, stage 3a ([place]) - Patient was evaluated by Dr. Carolin Sicks. Followed by [facility].   Severe nonproliferative diabetic retinopathy of both eyes with macular edema associated with type 2 diabetes mellitus ([place])  Nuclear sclerotic cataract of both eyes  Regular alcohol consumption -he used to drink vodka 2 times a week.  Patient states that he switched to wine.  Orders: Orders Placed This Encounter  Procedures   CBC with Differential/Platelet   COMPLETE METABOLIC PANEL WITH GFR   Uric acid   Meds ordered this encounter  Medications   allopurinol (ZYLOPRIM) 300 MG tablet    Sig: Take 1.5 tablets (450 mg total) by mouth daily.    Dispense:  45 tablet    Refill:  2    Follow-Up Instructions: Return in about 4 weeks (around 10/15/2022) for Gout, Osteoarthritis.   Bo Merino, MD  Note - This record has been created using Editor, commissioning.  Chart creation errors have been sought, but may not always  have been located. Such creation errors do not reflect on  the standard of medical care.

## 2022-09-09 DIAGNOSIS — L209 Atopic dermatitis, unspecified: Secondary | ICD-10-CM | POA: Diagnosis not present

## 2022-09-17 ENCOUNTER — Ambulatory Visit: Payer: No Typology Code available for payment source | Attending: Rheumatology | Admitting: Rheumatology

## 2022-09-17 ENCOUNTER — Encounter: Payer: Self-pay | Admitting: Rheumatology

## 2022-09-17 VITALS — BP 122/78 | HR 60 | Resp 18 | Ht 74.0 in | Wt 207.2 lb

## 2022-09-17 DIAGNOSIS — M19072 Primary osteoarthritis, left ankle and foot: Secondary | ICD-10-CM

## 2022-09-17 DIAGNOSIS — E790 Hyperuricemia without signs of inflammatory arthritis and tophaceous disease: Secondary | ICD-10-CM | POA: Diagnosis not present

## 2022-09-17 DIAGNOSIS — H2513 Age-related nuclear cataract, bilateral: Secondary | ICD-10-CM

## 2022-09-17 DIAGNOSIS — M19011 Primary osteoarthritis, right shoulder: Secondary | ICD-10-CM

## 2022-09-17 DIAGNOSIS — R768 Other specified abnormal immunological findings in serum: Secondary | ICD-10-CM

## 2022-09-17 DIAGNOSIS — L281 Prurigo nodularis: Secondary | ICD-10-CM

## 2022-09-17 DIAGNOSIS — Z5181 Encounter for therapeutic drug level monitoring: Secondary | ICD-10-CM | POA: Diagnosis not present

## 2022-09-17 DIAGNOSIS — M19042 Primary osteoarthritis, left hand: Secondary | ICD-10-CM

## 2022-09-17 DIAGNOSIS — M1A09X0 Idiopathic chronic gout, multiple sites, without tophus (tophi): Secondary | ICD-10-CM

## 2022-09-17 DIAGNOSIS — M19041 Primary osteoarthritis, right hand: Secondary | ICD-10-CM

## 2022-09-17 DIAGNOSIS — N1831 Chronic kidney disease, stage 3a: Secondary | ICD-10-CM

## 2022-09-17 DIAGNOSIS — M19071 Primary osteoarthritis, right ankle and foot: Secondary | ICD-10-CM

## 2022-09-17 DIAGNOSIS — M19012 Primary osteoarthritis, left shoulder: Secondary | ICD-10-CM

## 2022-09-17 DIAGNOSIS — E1165 Type 2 diabetes mellitus with hyperglycemia: Secondary | ICD-10-CM

## 2022-09-17 DIAGNOSIS — Z789 Other specified health status: Secondary | ICD-10-CM

## 2022-09-17 DIAGNOSIS — M720 Palmar fascial fibromatosis [Dupuytren]: Secondary | ICD-10-CM

## 2022-09-17 DIAGNOSIS — M79642 Pain in left hand: Secondary | ICD-10-CM

## 2022-09-17 DIAGNOSIS — E113413 Type 2 diabetes mellitus with severe nonproliferative diabetic retinopathy with macular edema, bilateral: Secondary | ICD-10-CM

## 2022-09-17 DIAGNOSIS — M5030 Other cervical disc degeneration, unspecified cervical region: Secondary | ICD-10-CM

## 2022-09-17 MED ORDER — ALLOPURINOL 300 MG PO TABS: 450.0000 mg | ORAL_TABLET | Freq: Every day | ORAL | 2 refills | Status: DC

## 2022-09-17 NOTE — Patient Instructions (Addendum)
Increase the dose of allopurinol 300 mg tablet, 1-1/2 tablet daily.  Please come in for blood work in 1 month

## 2022-09-23 DIAGNOSIS — L2089 Other atopic dermatitis: Secondary | ICD-10-CM | POA: Diagnosis not present

## 2022-10-02 ENCOUNTER — Encounter (INDEPENDENT_AMBULATORY_CARE_PROVIDER_SITE_OTHER): Payer: No Typology Code available for payment source | Admitting: Ophthalmology

## 2022-10-06 ENCOUNTER — Encounter: Payer: Self-pay | Admitting: *Deleted

## 2022-10-06 NOTE — Progress Notes (Signed)
Pt is established with PCP Dr. Alvy Bimler at [facility] at [facility]; and saw him on June 20 with a f/u appt scheduled for 12/23. He also sees Rheumatology and had BS there on 07/08/22 of 156 (screening event BS was 166. No further health equity team f/u indicated at this time

## 2022-10-13 DIAGNOSIS — E113411 Type 2 diabetes mellitus with severe nonproliferative diabetic retinopathy with macular edema, right eye: Secondary | ICD-10-CM | POA: Diagnosis not present

## 2022-10-13 DIAGNOSIS — E113412 Type 2 diabetes mellitus with severe nonproliferative diabetic retinopathy with macular edema, left eye: Secondary | ICD-10-CM | POA: Diagnosis not present

## 2022-10-13 DIAGNOSIS — H3562 Retinal hemorrhage, left eye: Secondary | ICD-10-CM | POA: Diagnosis not present

## 2022-10-13 DIAGNOSIS — H2513 Age-related nuclear cataract, bilateral: Secondary | ICD-10-CM | POA: Diagnosis not present

## 2022-10-17 NOTE — Progress Notes (Unsigned)
Office Visit Note  Patient: Steven Knapp             Date of Birth: September 23, 1946           MRN: [id number]             PCP: Georgina Quint, MD Referring: Georgina Quint, * Visit Date: 10/29/2022 Occupation: @GUAROCC @  Subjective:  Medication monitoring   History of Present Illness: Steven Knapp is a 76 y.o. male with history of gout, osteoarthritis, and DDD.  He is taking allopurinol 450 mg by mouth daily and colchicine 0.6 mg daily as needed during flares.  He is tolerating Perinal without any side effects and has not missed any doses recently.  He denies any signs or symptoms of a gout flare since his last office visit.  He states he has been having some increased soreness and stiffness in both shoulder joints especially first thing in the morning.  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  He has been trying to stretch his shoulders first thing in the morning which has been helpful.  He states the pain and swelling in his hands has improved but he continues to have difficulty making a complete fist.  He denies any new medical conditions.  He denies any other questions or concerns at this time.    Activities of Daily Living:  Patient reports morning stiffness for all day in shoulders. Patient Reports nocturnal pain.  Difficulty dressing/grooming: Denies Difficulty climbing stairs: Denies Difficulty getting out of chair: Denies Difficulty using hands for taps, buttons, cutlery, and/or writing: Denies  Review of Systems  Constitutional:  Negative for fatigue.  HENT:  Positive for mouth dryness. Negative for mouth sores.   Eyes:  Negative for dryness.  Respiratory:  Negative for shortness of breath.   Cardiovascular:  Negative for chest pain and palpitations.  Gastrointestinal:  Negative for blood in stool, constipation and diarrhea.  Endocrine: Negative for increased urination.  Genitourinary:  Negative for involuntary  urination.  Musculoskeletal:  Positive for joint pain, joint pain, joint swelling and morning stiffness. Negative for gait problem, myalgias, muscle weakness, muscle tenderness and myalgias.  Skin:  Positive for rash. Negative for color change and sensitivity to sunlight.  Allergic/Immunologic: Negative for susceptible to infections.  Neurological:  Negative for dizziness and headaches.  Hematological:  Negative for swollen glands.  Psychiatric/Behavioral:  Negative for depressed mood and sleep disturbance. The patient is not nervous/anxious.     PMFS History:  Patient Active Problem List   Diagnosis Date Noted   Tear duct infection, left 04/07/2022   Primary osteoarthritis of both shoulders 05/24/2021   Primary osteoarthritis of both hands 05/24/2021   Primary osteoarthritis of both feet 05/24/2021   DDD (degenerative disc disease), cervical 05/24/2021   Prurigo nodularis 05/24/2021   Chronic renal impairment, stage 3a (HCC) 05/24/2021   Uncontrolled type 2 diabetes mellitus with hyperglycemia (HCC) 07/30/2020   Severe nonproliferative diabetic retinopathy of left eye, with macular edema, associated with type 2 diabetes mellitus (HCC) 02/21/2020   Severe nonproliferative diabetic retinopathy of right eye, with macular edema, associated with type 2 diabetes mellitus (HCC) 02/21/2020   Nuclear sclerotic cataract of both eyes 02/21/2020   Retinal hemorrhage of left eye 02/21/2020   Retinal exudates and deposits 02/21/2020   Alternating exotropia 02/21/2020   Diabetes mellitus (HCC) 10/06/2019   Spinal stenosis 10/06/2019   History of gout 10/06/2019   History of rheumatoid arthritis 10/06/2019  Osteoarthritis of wrist 11/05/2018   Gout 09/16/2018    Past Medical History:  Diagnosis Date   Arthritis    Cataract    Diabetes mellitus without complication (HCC)    Spinal stenosis    per patient    Family History  Problem Relation Age of Onset   Diabetes Mother    Hypertension  Mother    Diabetes Brother    Soil scientist Daughter    Healthy Daughter    Healthy Son    Healthy Son    Scoliosis Son    History reviewed. No pertinent surgical history. Social History   Social History Narrative   Not on file   Immunization History  Administered Date(s) Administered   Fluad Quad(high Dose 65+) 07/30/2020   Moderna Sars-Covid-2 Vaccination 11/28/2019, 12/29/2019, 07/18/2020   PNEUMOCOCCAL CONJUGATE-20 05/06/2022   Pneumococcal Polysaccharide-23 10/06/2019   Zoster Recombinat (Shingrix) 05/06/2022     Objective: Vital Signs: BP (!) 170/83 (BP Location: Left Arm, Patient Position: Sitting, Cuff Size: Normal)   Pulse (!) 51   Resp 17   Ht 6\' 3"  (1.905 m)   Wt 212 lb 3.2 oz (96.3 kg)   BMI 26.52 kg/m    Physical Exam Vitals and nursing note reviewed.  Constitutional:      Appearance: He is well-developed.  HENT:     Head: Normocephalic and atraumatic.  Eyes:     Conjunctiva/sclera: Conjunctivae normal.     Pupils: Pupils are equal, round, and reactive to light.  Cardiovascular:     Rate and Rhythm: Normal rate and regular rhythm.     Heart sounds: Normal heart sounds.  Pulmonary:     Effort: Pulmonary effort is normal.     Breath sounds: Normal breath sounds.  Abdominal:     General: Bowel sounds are normal.     Palpations: Abdomen is soft.  Musculoskeletal:     Cervical back: Normal range of motion and neck supple.  Skin:    General: Skin is warm and dry.     Capillary Refill: Capillary refill takes less than 2 seconds.  Neurological:     Mental Status: He is alert and oriented to person, place, and time.  Psychiatric:        Behavior: Behavior normal.      Musculoskeletal Exam: C-spine has limited range of motion with lateral rotation.  Shoulder joints have good range of motion with some stiffness and crepitus bilaterally.  Right elbow joint contracture noted.  PIP and DIP thickening consistent with osteoarthritis of both  hands.  Incomplete fist formation bilaterally.  Hip joints have good range of motion with no groin pain.  Knee joints have good range of motion with no warmth or effusion.  Ankle joints have good range of motion with no joint tenderness.  CDAI Exam: CDAI Score: -- Patient Global: --; Provider Global: -- Swollen: --; Tender: -- Joint Exam 10/29/2022   No joint exam has been documented for this visit   There is currently no information documented on the homunculus. Go to the Rheumatology activity and complete the homunculus joint exam.  Investigation: No additional findings.  Imaging: No results found.  Recent Labs: Lab Results  Component Value Date   WBC 9.1 07/08/2022   HGB 13.0 (L) 07/08/2022   PLT 328 07/08/2022   NA 138 07/08/2022   K 4.7 07/08/2022   CL 102 07/08/2022   CO2 23 07/08/2022   GLUCOSE 156 (H) 07/08/2022   BUN 30 (H)  07/08/2022   CREATININE 1.81 (H) 07/08/2022   BILITOT 0.5 07/08/2022   ALKPHOS 65 01/28/2021   AST 18 07/08/2022   ALT 12 07/08/2022   PROT 8.0 07/08/2022   ALBUMIN 4.0 01/28/2021   CALCIUM 10.1 07/08/2022   GFRAA 45 (L) 04/30/2021    Speciality Comments: Allopurinol-nausea  Procedures:  No procedures performed Allergies: Other and Shellfish allergy      Assessment / Plan:     Visit Diagnoses: Idiopathic chronic gout of multiple sites without tophus - He has not had any signs or symptoms of a gout flare since his last office visit.  He has clinically been doing well taking allopurinol 450 mg once daily.  He has not needed to take colchicine recently since he has not had symptoms of a flare.  Uric acid was 7.0 on 07/08/22.  Uric acid level be rechecked today.  Discussed that ideally his uric acid level should be less than 6.  He has been trying to limit his intake of red meat.  Discussed the importance of avoiding a purine rich diet.  He will remain on the current dose of allopurinol as prescribed.  He does not need any refills of  allopurinol or colchicine at this time.  He was advised to notify us if he develops signs or symptoms of a gout flare.  He will follow-up in the office in 3 months or sooner if needed. - Plan: Uric acid  Hyperuricemia - Uric acid: 7.0 on 07/08/2022.  Uric acid level be rechecked today.- Plan: Uric acid  Medication monitoring encounter -Orders for CBC and CMP were released today.  Plan: CBC with Differential/Platelet, COMPLETE METABOLIC PANEL WITH GFR, Uric acid  Primary osteoarthritis of both shoulders: He has been experiencing increased soreness and stiffness in both shoulder joints especially first thing in the morning.  On examination he has discomfort with range of motion with crepitus bilaterally.  Different treatment options were discussed today.  He was given a handout of shoulder joint exercises to perform.  If his symptoms persist or worsen I recommended a referral to physical therapy.  Primary osteoarthritis of both hands: He has PIP and DIP thickening consistent with osteoarthritis of both hands.  Incomplete fist formation bilaterally.  No tenderness or synovitis noted on examination today.  Discussed the importance of joint protection and muscle strengthening.  Dupuytren's contracture of both hands: Unchanged.  Primary osteoarthritis of both feet - X-ray showed erosive changes and osteoarthritis.  He experiences intermittent swelling in the left ankle.  He has been wearing compression socks which have been helpful.  He is not experiencing any tenderness or discomfort in the left ankle at this time.  DDD (degenerative disc disease), cervical: C-spine has limited range of motion with lateral rotation.  Positive ANA (antinuclear antibody) - Double-stranded DNA negative, Smith negative, anti-CCP negative.  He has no clinical features of systemic lupus.  Other medical conditions are listed as follows:  Prurigo nodularis - Injections by Dr. Sebastian Ache.  Uncontrolled type 2 diabetes mellitus  with hyperglycemia (HCC)  Severe nonproliferative diabetic retinopathy of both eyes with macular edema associated with type 2 diabetes mellitus (HCC)  Nuclear sclerotic cataract of both eyes  Chronic renal impairment, stage 3a (HCC) - Patient was evaluated by Dr. Ronalee Belts. Followed by [facility].  Regular alcohol consumption - he used to drink vodka 2 times a week.  Patient states that he switched to wine.  Orders: Orders Placed This Encounter  Procedures   CBC with Differential/Platelet  COMPLETE METABOLIC PANEL WITH GFR   Uric acid   No orders of the defined types were placed in this encounter.     Follow-Up Instructions: Return in about 3 months (around 01/28/2023) for Gout, Osteoarthritis, DDD.   Gearldine Bienenstock, PA-C  Note - This record has been created using Dragon software.  Chart creation errors have been sought, but may not always  have been located. Such creation errors do not reflect on  the standard of medical care.

## 2022-10-21 DIAGNOSIS — E113412 Type 2 diabetes mellitus with severe nonproliferative diabetic retinopathy with macular edema, left eye: Secondary | ICD-10-CM | POA: Diagnosis not present

## 2022-10-21 DIAGNOSIS — E113411 Type 2 diabetes mellitus with severe nonproliferative diabetic retinopathy with macular edema, right eye: Secondary | ICD-10-CM | POA: Diagnosis not present

## 2022-10-21 DIAGNOSIS — H2513 Age-related nuclear cataract, bilateral: Secondary | ICD-10-CM | POA: Diagnosis not present

## 2022-10-21 DIAGNOSIS — H3562 Retinal hemorrhage, left eye: Secondary | ICD-10-CM | POA: Diagnosis not present

## 2022-10-29 ENCOUNTER — Encounter: Payer: Self-pay | Admitting: Physician Assistant

## 2022-10-29 ENCOUNTER — Ambulatory Visit: Payer: No Typology Code available for payment source | Attending: Physician Assistant | Admitting: Physician Assistant

## 2022-10-29 VITALS — BP 170/83 | HR 51 | Resp 17 | Ht 75.0 in | Wt 212.2 lb

## 2022-10-29 DIAGNOSIS — M5030 Other cervical disc degeneration, unspecified cervical region: Secondary | ICD-10-CM

## 2022-10-29 DIAGNOSIS — M19071 Primary osteoarthritis, right ankle and foot: Secondary | ICD-10-CM

## 2022-10-29 DIAGNOSIS — M1A09X0 Idiopathic chronic gout, multiple sites, without tophus (tophi): Secondary | ICD-10-CM | POA: Diagnosis not present

## 2022-10-29 DIAGNOSIS — L281 Prurigo nodularis: Secondary | ICD-10-CM

## 2022-10-29 DIAGNOSIS — M19041 Primary osteoarthritis, right hand: Secondary | ICD-10-CM | POA: Diagnosis not present

## 2022-10-29 DIAGNOSIS — M19072 Primary osteoarthritis, left ankle and foot: Secondary | ICD-10-CM

## 2022-10-29 DIAGNOSIS — R768 Other specified abnormal immunological findings in serum: Secondary | ICD-10-CM

## 2022-10-29 DIAGNOSIS — M19042 Primary osteoarthritis, left hand: Secondary | ICD-10-CM | POA: Diagnosis not present

## 2022-10-29 DIAGNOSIS — M720 Palmar fascial fibromatosis [Dupuytren]: Secondary | ICD-10-CM | POA: Diagnosis not present

## 2022-10-29 DIAGNOSIS — M19012 Primary osteoarthritis, left shoulder: Secondary | ICD-10-CM

## 2022-10-29 DIAGNOSIS — E790 Hyperuricemia without signs of inflammatory arthritis and tophaceous disease: Secondary | ICD-10-CM | POA: Diagnosis not present

## 2022-10-29 DIAGNOSIS — N1831 Chronic kidney disease, stage 3a: Secondary | ICD-10-CM

## 2022-10-29 DIAGNOSIS — H2513 Age-related nuclear cataract, bilateral: Secondary | ICD-10-CM

## 2022-10-29 DIAGNOSIS — Z5181 Encounter for therapeutic drug level monitoring: Secondary | ICD-10-CM | POA: Diagnosis not present

## 2022-10-29 DIAGNOSIS — E1165 Type 2 diabetes mellitus with hyperglycemia: Secondary | ICD-10-CM

## 2022-10-29 DIAGNOSIS — Z789 Other specified health status: Secondary | ICD-10-CM

## 2022-10-29 DIAGNOSIS — M19011 Primary osteoarthritis, right shoulder: Secondary | ICD-10-CM

## 2022-10-29 DIAGNOSIS — E113413 Type 2 diabetes mellitus with severe nonproliferative diabetic retinopathy with macular edema, bilateral: Secondary | ICD-10-CM

## 2022-10-29 NOTE — Addendum Note (Signed)
Addended by: Gearldine Bienenstock on: 10/29/2022 11:33 AM   Modules accepted: Orders

## 2022-10-29 NOTE — Patient Instructions (Signed)

## 2022-10-30 LAB — COMPLETE METABOLIC PANEL WITH GFR
AG Ratio: 1.1 (calc) (ref 1.0–2.5)
ALT: 13 U/L (ref 9–46)
AST: 23 U/L (ref 10–35)
Albumin: 4 g/dL (ref 3.6–5.1)
Alkaline phosphatase (APISO): 68 U/L (ref 35–144)
BUN: 16 mg/dL (ref 7–25)
CO2: 25 mmol/L (ref 20–32)
Calcium: 9.9 mg/dL (ref 8.6–10.3)
Chloride: 107 mmol/L (ref 98–110)
Creat: 1.25 mg/dL (ref 0.70–1.28)
Globulin: 3.5 g/dL (calc) (ref 1.9–3.7)
Glucose, Bld: 101 mg/dL — ABNORMAL HIGH (ref 65–99)
Potassium: 4.1 mmol/L (ref 3.5–5.3)
Sodium: 141 mmol/L (ref 135–146)
Total Bilirubin: 0.6 mg/dL (ref 0.2–1.2)
Total Protein: 7.5 g/dL (ref 6.1–8.1)
eGFR: 59 mL/min/{1.73_m2} — ABNORMAL LOW (ref >=60)

## 2022-10-30 LAB — CBC WITH DIFFERENTIAL/PLATELET
Absolute Monocytes: 548 cells/uL (ref 200–950)
Basophils Absolute: 32 cells/uL (ref 0–200)
Basophils Relative: 0.5 %
Eosinophils Absolute: 170 cells/uL (ref 15–500)
Eosinophils Relative: 2.7 %
HCT: 37.9 % — ABNORMAL LOW (ref 38.5–50.0)
Hemoglobin: 12.5 g/dL — ABNORMAL LOW (ref 13.2–17.1)
Lymphs Abs: 1783 cells/uL (ref 850–3900)
MCH: 28.3 pg (ref 27.0–33.0)
MCHC: 33 g/dL (ref 32.0–36.0)
MCV: 85.9 fL (ref 80.0–100.0)
MPV: 11.8 fL (ref 7.5–12.5)
Monocytes Relative: 8.7 %
Neutro Abs: 3767 cells/uL (ref 1500–7800)
Neutrophils Relative %: 59.8 %
Platelets: 224 10*3/uL (ref 140–400)
RBC: 4.41 10*6/uL (ref 4.20–5.80)
RDW: 15.6 % — ABNORMAL HIGH (ref 11.0–15.0)
Total Lymphocyte: 28.3 %
WBC: 6.3 10*3/uL (ref 3.8–10.8)

## 2022-10-30 LAB — URIC ACID: Uric Acid, Serum: 5.8 mg/dL (ref 4.0–8.0)

## 2022-10-30 LAB — HEMOGLOBIN A1C
Hgb A1c MFr Bld: 6.9 % of total Hgb — ABNORMAL HIGH (ref <5.7)
Mean Plasma Glucose: 151 mg/dL
eAG (mmol/L): 8.4 mmol/L

## 2022-10-30 NOTE — Progress Notes (Signed)
Uric acid has improved and is within the desirable range.  Hgb A1c is stable-6.9%.  please notify the patient and clarify if he would like the results forward to his PCP to review?  CBC stable. GFR has improved.  Creatinine WNL.  We will continue to monitor lab work closely.

## 2022-11-05 ENCOUNTER — Encounter: Payer: Self-pay | Admitting: Emergency Medicine

## 2022-11-05 ENCOUNTER — Ambulatory Visit (INDEPENDENT_AMBULATORY_CARE_PROVIDER_SITE_OTHER): Payer: No Typology Code available for payment source | Admitting: Emergency Medicine

## 2022-11-05 ENCOUNTER — Ambulatory Visit: Payer: No Typology Code available for payment source | Admitting: Emergency Medicine

## 2022-11-05 VITALS — BP 130/76 | HR 61 | Temp 98.3°F | Ht 75.0 in | Wt 211.0 lb

## 2022-11-05 DIAGNOSIS — M19012 Primary osteoarthritis, left shoulder: Secondary | ICD-10-CM

## 2022-11-05 DIAGNOSIS — N1831 Chronic kidney disease, stage 3a: Secondary | ICD-10-CM

## 2022-11-05 DIAGNOSIS — E1165 Type 2 diabetes mellitus with hyperglycemia: Secondary | ICD-10-CM | POA: Diagnosis not present

## 2022-11-05 DIAGNOSIS — E113412 Type 2 diabetes mellitus with severe nonproliferative diabetic retinopathy with macular edema, left eye: Secondary | ICD-10-CM

## 2022-11-05 DIAGNOSIS — Z8739 Personal history of other diseases of the musculoskeletal system and connective tissue: Secondary | ICD-10-CM

## 2022-11-05 DIAGNOSIS — M19011 Primary osteoarthritis, right shoulder: Secondary | ICD-10-CM | POA: Diagnosis not present

## 2022-11-05 DIAGNOSIS — N2889 Other specified disorders of kidney and ureter: Secondary | ICD-10-CM

## 2022-11-05 NOTE — Assessment & Plan Note (Addendum)
Well-controlled diabetes with hemoglobin A1c at 6.9. Continue glipizide 5 mg twice a day.  No hypoglycemic events. Cardiovascular risks associated with diabetes discussed. Diet and nutrition discussed. Continue rosuvastatin 10 mg daily Follow-up in 6 months.

## 2022-11-05 NOTE — Assessment & Plan Note (Signed)
Stable. Well controlled.

## 2022-11-05 NOTE — Assessment & Plan Note (Signed)
No recent flareups.  Continue allopurinol 450 mg daily. Follows up with rheumatologist on a regular basis Takes colchicine as needed

## 2022-11-05 NOTE — Progress Notes (Signed)
Steven Knapp 76 y.o.   Chief Complaint  Patient presents with   Follow-up    79mth f/u appt, no concerns     HISTORY OF PRESENT ILLNESS: This is a 76y.o. male here for 679-monthollow-up of diabetes. History of osteoarthritis and gout.  Sees rheumatologist on a regular basis. Presently taking Dupixent Most recent blood work reviewed with patient.  A1c is 6.9 and GFR at 59.  Much improved numbers Sees ophthalmologist on a regular basis.  Getting shots in the eyes which are helping. Has no complaints or medical concerns today.  HPI   Prior to Admission medications   Medication Sig Start Date End Date Taking? Authorizing Provider  allopurinol (ZYLOPRIM) 300 MG tablet Take 1.5 tablets (450 mg total) by mouth daily. 09/17/22  Yes Deveshwar, ShAbel PrestoMD  Arginine 500 MG CAPS Take by mouth daily.   Yes [provider]  BEPREVE 1.5 % SOLN 1 drop 2 (two) times daily. 02/29/20  Yes [provider]  blood glucose meter kit and supplies Dispense based on patient and insurance preference. Use up to four times daily as directed. (FOR ICD-10 E10.9, E11.9). 02/07/21  Yes Otelia Hettinger, MiInes BloomerMD  clobetasol cream (TEMOVATE) 0.1.61 Apply 1 application  topically 2 (two) times daily.   Yes [provider]  colchicine 0.6 MG tablet Take 1 tablet (0.6 mg total) by mouth daily as needed. 04/18/22  Yes DaOfilia NeasPA-C  Continuous Blood Gluc Sensor (FREESTYLE LIBRE SENSOR SYSTEM) MISC 1 Device by Does not apply route daily. 03/07/21  Yes Korby Ratay, MiInes BloomerMD  Dupilumab (DUPIXENT [place]) Inject into the skin.   Yes [provider]  glipiZIDE (GLUCOTROL) 5 MG tablet Take 1 tablet (5 mg total) by mouth 2 (two) times daily before a meal. 05/06/22 05/01/23 Yes Lorilynn Lehr, MiInes BloomerMD  ibuprofen (ADVIL) 200 MG tablet Take 200 mg by mouth every 6 (six) hours as needed for moderate pain.   Yes [provider]  Lactobacillus (PROBIOTIC ACIDOPHILUS PO) Take 1 capsule by  mouth as needed.   Yes [provider]  Multiple Vitamins-Minerals (ZINC PO) Take by mouth as needed.   Yes [provider]  rosuvastatin (CRESTOR) 10 MG tablet Take 1 tablet (10 mg total) by mouth daily. 07/30/20  Yes Luverta Korte, MiInes BloomerMD  sildenafil (VIAGRA) 100 MG tablet TAKE 1/2 TO ONE TABLET BY MOUTH DAILY AS NEEDED FOR ERECTILE DYSFUNCTION 05/06/22  Yes Leelynd Maldonado, MiInes BloomerMD  SYSTANE ULTRA 0.4-0.3 % SOLN SMARTSIG:1 Drop(s) In Eye(s) As Needed 11/23/19  Yes [provider]    Allergies  Allergen Reactions   Other Hives and Itching    strawberries, wine, and corn   Shellfish Allergy     Patient Active Problem List   Diagnosis Date Noted   Tear duct infection, left 04/07/2022   Primary osteoarthritis of both shoulders 05/24/2021   Primary osteoarthritis of both hands 05/24/2021   Primary osteoarthritis of both feet 05/24/2021   DDD (degenerative disc disease), cervical 05/24/2021   Prurigo nodularis 05/24/2021   Chronic renal impairment, stage 3a ([facility]07/06/2021   Uncontrolled type 2 diabetes mellitus with hyperglycemia ([facility]/13/2021   Severe nonproliferative diabetic retinopathy of left eye, with macular edema, associated with type 2 diabetes mellitus ([facility]04/04/2020   Severe nonproliferative diabetic retinopathy of right eye, with macular edema, associated with type 2 diabetes mellitus ([facility]04/04/2020   Nuclear sclerotic cataract of both eyes 02/21/2020   Retinal hemorrhage of left eye 02/21/2020  Retinal exudates and deposits 02/21/2020   Alternating exotropia 02/21/2020   Diabetes mellitus ([place]) 10/06/2019   Spinal stenosis 10/06/2019   History of gout 10/06/2019   History of rheumatoid arthritis 10/06/2019   Osteoarthritis of wrist 11/05/2018   Gout 09/16/2018    Past Medical History:  Diagnosis Date   Arthritis    Cataract    Diabetes mellitus without complication ([place])    Spinal stenosis    per patient    History  reviewed. No pertinent surgical history.  Social History   Socioeconomic History   Marital status: Single    Spouse name: Not on file   Number of children: Not on file   Years of education: Not on file   Highest education level: Not on file  Occupational History   Not on file  Tobacco Use   Smoking status: Former    Years: 4.00    Types: Cigarettes    Passive exposure: Past   Smokeless tobacco: Never  Vaping Use   Vaping Use: Never used  Substance and Sexual Activity   Alcohol use: Yes    Comment: occ   Drug use: Never   Sexual activity: Not on file  Other Topics Concern   Not on file  Social History Narrative   Not on file   Social Determinants of Health   Financial Resource Strain: Not on file  Food Insecurity: Not on file  Transportation Needs: Not on file  Physical Activity: Not on file  Stress: Not on file  Social Connections: Not on file  Intimate Partner Violence: Not on file    Family History  Problem Relation Age of Onset   Diabetes Mother    Hypertension Mother    Diabetes Brother    Healthy Brother    Healthy Daughter    Healthy Daughter    Healthy Son    Healthy Son    Scoliosis Son      Review of Systems  Constitutional: Negative.  Negative for chills and fever.  HENT: Negative.  Negative for congestion and sore throat.   Respiratory: Negative.  Negative for cough and shortness of breath.   Cardiovascular: Negative.  Negative for chest pain and palpitations.  Gastrointestinal:  Negative for abdominal pain, diarrhea, nausea and vomiting.  Musculoskeletal:  Positive for joint pain.  Skin: Negative.  Negative for rash.  Neurological: Negative.  Negative for dizziness and headaches.  All other systems reviewed and are negative.  Today's Vitals   11/05/22 1452  BP: 130/76  Pulse: 61  Temp: 98.3 F (36.8 C)  TempSrc: Oral  SpO2: 98%  Weight: 211 lb (95.7 kg)  Height: _0  (1.905 m)   Body mass index is 26.37 kg/m.   Physical  Exam Vitals reviewed.  HENT:     Head: Normocephalic.     Mouth/Throat:     Mouth: Mucous membranes are moist.     Pharynx: Oropharynx is clear.  Eyes:     Extraocular Movements: Extraocular movements intact.     Pupils: Pupils are equal, round, and reactive to light.  Cardiovascular:     Rate and Rhythm: Normal rate and regular rhythm.     Pulses: Normal pulses.     Heart sounds: Normal heart sounds.  Pulmonary:     Effort: Pulmonary effort is normal.     Breath sounds: Normal breath sounds.  Musculoskeletal:     Cervical back: No tenderness.  Lymphadenopathy:     Cervical: No cervical adenopathy.  Skin:  General: Skin is warm and dry.  Neurological:     General: No focal deficit present.     Mental Status: He is alert and oriented to person, place, and time.  Psychiatric:        Mood and Affect: Mood normal.        Behavior: Behavior normal.    Lab Results  Component Value Date   HGBA1C 6.9 (H) 10/29/2022      ASSESSMENT & PLAN: A total of 44 minutes was spent with the patient and counseling/coordination of care regarding preparing for this visit, review of most recent office visit notes, review of most recent rheumatologist office visit note, review of most recent ophthalmologist office visit note, review of multiple chronic medical conditions and their management, review of all medications, review of most recent blood work results including interpretation of hemoglobin A1c, prognosis, documentation, and need for follow-up  Problem List Items Addressed This Visit       Endocrine   Severe nonproliferative diabetic retinopathy of left eye, with macular edema, associated with type 2 diabetes mellitus ([place])    Stable.  Follows up with ophthalmologist on a regular basis.      Uncontrolled type 2 diabetes mellitus with hyperglycemia ([place]) - Primary    Well-controlled diabetes with hemoglobin A1c at 6.9. Continue glipizide 5 mg twice a day.  No hypoglycemic  events. Cardiovascular risks associated with diabetes discussed. Diet and nutrition discussed. Continue rosuvastatin 10 mg daily Follow-up in 6 months.         Musculoskeletal and Integument   Primary osteoarthritis of both shoulders    Stable.  Well-controlled        Genitourinary   Chronic renal impairment, stage 3a (HCC)    GFR much improved at 59. Advised to stay well-hydrated and avoid NSAIDs is much as possible        Other   History of gout    No recent flareups.  Continue allopurinol 450 mg daily. Follows up with rheumatologist on a regular basis Takes colchicine as needed      Other Visit Diagnoses     Stage 3a chronic kidney disease [facility])          Patient Instructions  Health Maintenance After Age 37 After age 60, you are at a higher risk for certain long-term diseases and infections as well as injuries from falls. Falls are a major cause of broken bones and head injuries in people who are older than age 92. Getting regular preventive care can help to keep you healthy and well. Preventive care includes getting regular testing and making lifestyle changes as recommended by your health care provider. Talk with your health care provider about: Which screenings and tests you should have. A screening is a test that checks for a disease when you have no symptoms. A diet and exercise plan that is right for you. What should I know about screenings and tests to prevent falls? Screening and testing are the best ways to find a health problem early. Early diagnosis and treatment give you the best chance of managing medical conditions that are common after age 2. Certain conditions and lifestyle choices may make you more likely to have a fall. Your health care provider may recommend: Regular vision checks. Poor vision and conditions such as cataracts can make you more likely to have a fall. If you wear glasses, make sure to get your prescription updated if your vision  changes. Medicine review. Work with your health care provider  to regularly review all of the medicines you are taking, including over-the-counter medicines. Ask your health care provider about any side effects that may make you more likely to have a fall. Tell your health care provider if any medicines that you take make you feel dizzy or sleepy. Strength and balance checks. Your health care provider may recommend certain tests to check your strength and balance while standing, walking, or changing positions. Foot health exam. Foot pain and numbness, as well as not wearing proper footwear, can make you more likely to have a fall. Screenings, including: Osteoporosis screening. Osteoporosis is a condition that causes the bones to get weaker and break more easily. Blood pressure screening. Blood pressure changes and medicines to control blood pressure can make you feel dizzy. Depression screening. You may be more likely to have a fall if you have a fear of falling, feel depressed, or feel unable to do activities that you used to do. Alcohol use screening. Using too much alcohol can affect your balance and may make you more likely to have a fall. Follow these instructions at home: Lifestyle Do not drink alcohol if: Your health care provider tells you not to drink. If you drink alcohol: Limit how much you have to: 0-1 drink a day for women. 0-2 drinks a day for men. Know how much alcohol is in your drink. In the [place] one drink equals one 12 oz bottle of beer (355 mL), one 5 oz glass of wine (148 mL), or one 1 oz glass of hard liquor (44 mL). Do not use any products that contain nicotine or tobacco. These products include cigarettes, chewing tobacco, and vaping devices, such as e-cigarettes. If you need help quitting, ask your health care provider. Activity  Follow a regular exercise program to stay fit. This will help you maintain your balance. Ask your health care provider what types of exercise  are appropriate for you. If you need a cane or walker, use it as recommended by your health care provider. Wear supportive shoes that have nonskid soles. Safety  Remove any tripping hazards, such as rugs, cords, and clutter. Install safety equipment such as grab bars in bathrooms and safety rails on stairs. Keep rooms and walkways well-lit. General instructions Talk with your health care provider about your risks for falling. Tell your health care provider if: You fall. Be sure to tell your health care provider about all falls, even ones that seem minor. You feel dizzy, tiredness (fatigue), or off-balance. Take over-the-counter and prescription medicines only as told by your health care provider. These include supplements. Eat a healthy diet and maintain a healthy weight. A healthy diet includes low-fat dairy products, low-fat (lean) meats, and fiber from whole grains, beans, and lots of fruits and vegetables. Stay current with your vaccines. Schedule regular health, dental, and eye exams. Summary Having a healthy lifestyle and getting preventive care can help to protect your health and wellness after age 57. Screening and testing are the best way to find a health problem early and help you avoid having a fall. Early diagnosis and treatment give you the best chance for managing medical conditions that are more common for people who are older than age 82. Falls are a major cause of broken bones and head injuries in people who are older than age 26. Take precautions to prevent a fall at home. Work with your health care provider to learn what changes you can make to improve your health and wellness and to prevent  falls. This information is not intended to replace advice given to you by your health care provider. Make sure you discuss any questions you have with your health care provider. Document Revised: 03/25/2021 Document Reviewed: 03/25/2021 Elsevier Patient Education  [place], MD [facility]

## 2022-11-05 NOTE — Assessment & Plan Note (Signed)
Stable.  Follows up with ophthalmologist on a regular basis.

## 2022-11-05 NOTE — Patient Instructions (Signed)
Health Maintenance After Age 78 After age 78, you are at a higher risk for certain long-term diseases and infections as well as injuries from falls. Falls are a major cause of broken bones and head injuries in people who are older than age 78. Getting regular preventive care can help to keep you healthy and well. Preventive care includes getting regular testing and making lifestyle changes as recommended by your health care provider. Talk with your health care provider about: Which screenings and tests you should have. A screening is a test that checks for a disease when you have no symptoms. A diet and exercise plan that is right for you. What should I know about screenings and tests to prevent falls? Screening and testing are the best ways to find a health problem early. Early diagnosis and treatment give you the best chance of managing medical conditions that are common after age 78. Certain conditions and lifestyle choices may make you more likely to have a fall. Your health care provider may recommend: Regular vision checks. Poor vision and conditions such as cataracts can make you more likely to have a fall. If you wear glasses, make sure to get your prescription updated if your vision changes. Medicine review. Work with your health care provider to regularly review all of the medicines you are taking, including over-the-counter medicines. Ask your health care provider about any side effects that may make you more likely to have a fall. Tell your health care provider if any medicines that you take make you feel dizzy or sleepy. Strength and balance checks. Your health care provider may recommend certain tests to check your strength and balance while standing, walking, or changing positions. Foot health exam. Foot pain and numbness, as well as not wearing proper footwear, can make you more likely to have a fall. Screenings, including: Osteoporosis screening. Osteoporosis is a condition that causes  the bones to get weaker and break more easily. Blood pressure screening. Blood pressure changes and medicines to control blood pressure can make you feel dizzy. Depression screening. You may be more likely to have a fall if you have a fear of falling, feel depressed, or feel unable to do activities that you used to do. Alcohol use screening. Using too much alcohol can affect your balance and may make you more likely to have a fall. Follow these instructions at home: Lifestyle Do not drink alcohol if: Your health care provider tells you not to drink. If you drink alcohol: Limit how much you have to: 0-1 drink a day for women. 0-2 drinks a day for men. Know how much alcohol is in your drink. In the [place] one drink equals one 12 oz bottle of beer (355 mL), one 5 oz glass of wine (148 mL), or one 1 oz glass of hard liquor (44 mL). Do not use any products that contain nicotine or tobacco. These products include cigarettes, chewing tobacco, and vaping devices, such as e-cigarettes. If you need help quitting, ask your health care provider. Activity  Follow a regular exercise program to stay fit. This will help you maintain your balance. Ask your health care provider what types of exercise are appropriate for you. If you need a cane or walker, use it as recommended by your health care provider. Wear supportive shoes that have nonskid soles. Safety  Remove any tripping hazards, such as rugs, cords, and clutter. Install safety equipment such as grab bars in bathrooms and safety rails on stairs. Keep rooms and walkways   well-lit. General instructions Talk with your health care provider about your risks for falling. Tell your health care provider if: You fall. Be sure to tell your health care provider about all falls, even ones that seem minor. You feel dizzy, tiredness (fatigue), or off-balance. Take over-the-counter and prescription medicines only as told by your health care provider. These include  supplements. Eat a healthy diet and maintain a healthy weight. A healthy diet includes low-fat dairy products, low-fat (lean) meats, and fiber from whole grains, beans, and lots of fruits and vegetables. Stay current with your vaccines. Schedule regular health, dental, and eye exams. Summary Having a healthy lifestyle and getting preventive care can help to protect your health and wellness after age 78. Screening and testing are the best way to find a health problem early and help you avoid having a fall. Early diagnosis and treatment give you the best chance for managing medical conditions that are more common for people who are older than age 78. Falls are a major cause of broken bones and head injuries in people who are older than age 78. Take precautions to prevent a fall at home. Work with your health care provider to learn what changes you can make to improve your health and wellness and to prevent falls. This information is not intended to replace advice given to you by your health care provider. Make sure you discuss any questions you have with your health care provider. Document Revised: 03/25/2021 Document Reviewed: 03/25/2021 Elsevier Patient Education  2023 [employer]vier Inc.  

## 2022-11-05 NOTE — Assessment & Plan Note (Signed)
GFR much improved at 59. Advised to stay well-hydrated and avoid NSAIDs is much as possible

## 2022-11-12 ENCOUNTER — Other Ambulatory Visit: Payer: Self-pay | Admitting: Emergency Medicine

## 2022-11-27 DIAGNOSIS — H3562 Retinal hemorrhage, left eye: Secondary | ICD-10-CM | POA: Diagnosis not present

## 2022-11-27 DIAGNOSIS — H2513 Age-related nuclear cataract, bilateral: Secondary | ICD-10-CM | POA: Diagnosis not present

## 2022-11-27 DIAGNOSIS — E113411 Type 2 diabetes mellitus with severe nonproliferative diabetic retinopathy with macular edema, right eye: Secondary | ICD-10-CM | POA: Diagnosis not present

## 2022-12-03 DIAGNOSIS — L281 Prurigo nodularis: Secondary | ICD-10-CM | POA: Diagnosis not present

## 2022-12-05 DIAGNOSIS — M109 Gout, unspecified: Secondary | ICD-10-CM | POA: Diagnosis not present

## 2022-12-05 DIAGNOSIS — N189 Chronic kidney disease, unspecified: Secondary | ICD-10-CM | POA: Diagnosis not present

## 2022-12-05 DIAGNOSIS — D631 Anemia in chronic kidney disease: Secondary | ICD-10-CM | POA: Diagnosis not present

## 2022-12-05 DIAGNOSIS — E1122 Type 2 diabetes mellitus with diabetic chronic kidney disease: Secondary | ICD-10-CM | POA: Diagnosis not present

## 2022-12-05 DIAGNOSIS — N1831 Chronic kidney disease, stage 3a: Secondary | ICD-10-CM | POA: Diagnosis not present

## 2022-12-05 DIAGNOSIS — I10 Essential (primary) hypertension: Secondary | ICD-10-CM | POA: Diagnosis not present

## 2022-12-05 DIAGNOSIS — M069 Rheumatoid arthritis, unspecified: Secondary | ICD-10-CM | POA: Diagnosis not present

## 2022-12-05 DIAGNOSIS — E559 Vitamin D deficiency, unspecified: Secondary | ICD-10-CM | POA: Diagnosis not present

## 2022-12-18 DIAGNOSIS — E113412 Type 2 diabetes mellitus with severe nonproliferative diabetic retinopathy with macular edema, left eye: Secondary | ICD-10-CM | POA: Diagnosis not present

## 2022-12-18 DIAGNOSIS — L209 Atopic dermatitis, unspecified: Secondary | ICD-10-CM | POA: Diagnosis not present

## 2022-12-18 DIAGNOSIS — E113411 Type 2 diabetes mellitus with severe nonproliferative diabetic retinopathy with macular edema, right eye: Secondary | ICD-10-CM | POA: Diagnosis not present

## 2022-12-18 DIAGNOSIS — H2513 Age-related nuclear cataract, bilateral: Secondary | ICD-10-CM | POA: Diagnosis not present

## 2022-12-18 DIAGNOSIS — H3562 Retinal hemorrhage, left eye: Secondary | ICD-10-CM | POA: Diagnosis not present

## 2023-01-01 DIAGNOSIS — L2089 Other atopic dermatitis: Secondary | ICD-10-CM | POA: Diagnosis not present

## 2023-01-05 DIAGNOSIS — E113411 Type 2 diabetes mellitus with severe nonproliferative diabetic retinopathy with macular edema, right eye: Secondary | ICD-10-CM | POA: Diagnosis not present

## 2023-01-05 DIAGNOSIS — H3562 Retinal hemorrhage, left eye: Secondary | ICD-10-CM | POA: Diagnosis not present

## 2023-01-05 DIAGNOSIS — E113412 Type 2 diabetes mellitus with severe nonproliferative diabetic retinopathy with macular edema, left eye: Secondary | ICD-10-CM | POA: Diagnosis not present

## 2023-01-05 DIAGNOSIS — H2513 Age-related nuclear cataract, bilateral: Secondary | ICD-10-CM | POA: Diagnosis not present

## 2023-01-05 LAB — HM DIABETES EYE EXAM

## 2023-01-15 DIAGNOSIS — L2089 Other atopic dermatitis: Secondary | ICD-10-CM | POA: Diagnosis not present

## 2023-01-15 NOTE — Progress Notes (Signed)
Office Visit Note  Patient: Steven Knapp             Date of Birth: December 20, 1945           MRN: [id number]             PCP: Georgina Quint, MD Referring: Georgina Quint, * Visit Date: 01/29/2023 Occupation: @GUAROCC @  Subjective:    History of Present Illness: Steven Knapp is a 77 y.o. male with history of gout, osteoarthritis, and DDD.  He is taking allopurinol 450 mg daily.  He denies having a gout flare since the last visit.  He has been taking allopurinol on a daily basis without any interruption.  He denies any side effects from allopurinol.  He continues to have some stiffness in his hands due to underlying osteoarthritis.  He has intermittent pain in his knee joints.  He has noticed hammertoes in his feet.  He has been sleeping which helps the shoulders.  He also does gardening and is stays busy.  Activities of Daily Living:  Patient reports morning stiffness for 1 hour.   Patient Denies nocturnal pain.  Difficulty dressing/grooming: Denies Difficulty climbing stairs: Denies Difficulty getting out of chair: Reports Difficulty using hands for taps, buttons, cutlery, and/or writing: Reports  Review of Systems  Constitutional:  Negative for fatigue.  HENT:  Negative for mouth sores and mouth dryness.   Eyes:  Positive for dryness.  Respiratory:  Negative for shortness of breath.   Cardiovascular:  Negative for chest pain and palpitations.  Gastrointestinal:  Positive for diarrhea. Negative for blood in stool and constipation.  Endocrine: Negative for increased urination.  Genitourinary:  Negative for involuntary urination.  Musculoskeletal:  Positive for joint pain, gait problem, joint pain, joint swelling and morning stiffness. Negative for myalgias, muscle weakness, muscle tenderness and myalgias.  Skin:  Negative for color change, rash, hair loss and sensitivity to sunlight.  Allergic/Immunologic: Negative for susceptible to infections.  Neurological:  Negative  for dizziness and headaches.  Hematological:  Negative for swollen glands.  Psychiatric/Behavioral:  Positive for sleep disturbance. Negative for depressed mood. The patient is not nervous/anxious.     PMFS History:  Patient Active Problem List   Diagnosis Date Noted   Primary osteoarthritis of both shoulders 05/24/2021   Primary osteoarthritis of both hands 05/24/2021   Primary osteoarthritis of both feet 05/24/2021   DDD (degenerative disc disease), cervical 05/24/2021   Prurigo nodularis 05/24/2021   Chronic renal impairment, stage 3a (HCC) 05/24/2021   Uncontrolled type 2 diabetes mellitus with hyperglycemia (HCC) 07/30/2020   Severe nonproliferative diabetic retinopathy of left eye, with macular edema, associated with type 2 diabetes mellitus (HCC) 02/21/2020   Severe nonproliferative diabetic retinopathy of right eye, with macular edema, associated with type 2 diabetes mellitus (HCC) 02/21/2020   Nuclear sclerotic cataract of both eyes 02/21/2020   Retinal hemorrhage of left eye 02/21/2020   Retinal exudates and deposits 02/21/2020   Alternating exotropia 02/21/2020   Diabetes mellitus (HCC) 10/06/2019   Spinal stenosis 10/06/2019   History of gout 10/06/2019   History of rheumatoid arthritis 10/06/2019   Osteoarthritis of wrist 11/05/2018   Gout 09/16/2018    Past Medical History:  Diagnosis Date   Arthritis    Cataract    Diabetes mellitus without complication (HCC)    Spinal stenosis    per patient    Family History  Problem Relation Age of Onset   Diabetes Mother    Hypertension Mother  Diabetes Brother    Soil scientist Daughter    Healthy Daughter    Healthy Son    Healthy Son    Scoliosis Son    History reviewed. No pertinent surgical history. Social History   Social History Narrative   Not on file   Immunization History  Administered Date(s) Administered   Fluad Quad(high Dose 65+) 07/30/2020, 08/01/2022   Moderna Sars-Covid-2  Vaccination 11/28/2019, 12/29/2019, 07/18/2020   PNEUMOCOCCAL CONJUGATE-20 05/06/2022   Pneumococcal Polysaccharide-23 10/06/2019   Zoster Recombinat (Shingrix) 05/06/2022     Objective: Vital Signs: BP (!) 158/69 (BP Location: Left Arm, Patient Position: Sitting, Cuff Size: Normal)   Pulse 67   Resp 15   Ht 6\' 2"  (1.88 m)   Wt 216 lb (98 kg)   BMI 27.73 kg/m    Physical Exam Vitals and nursing note reviewed.  Constitutional:      Appearance: He is well-developed.  HENT:     Head: Normocephalic and atraumatic.  Eyes:     Conjunctiva/sclera: Conjunctivae normal.     Pupils: Pupils are equal, round, and reactive to light.  Cardiovascular:     Rate and Rhythm: Normal rate and regular rhythm.     Heart sounds: Normal heart sounds.  Pulmonary:     Effort: Pulmonary effort is normal.     Breath sounds: Normal breath sounds.  Abdominal:     General: Bowel sounds are normal.     Palpations: Abdomen is soft.  Musculoskeletal:     Cervical back: Normal range of motion and neck supple.  Skin:    General: Skin is warm and dry.     Capillary Refill: Capillary refill takes less than 2 seconds.  Neurological:     Mental Status: He is alert and oriented to person, place, and time.  Psychiatric:        Behavior: Behavior normal.      Musculoskeletal Exam: Cervical spine was in good range of motion.  Shoulder joints, elbow joints, wrist joints were in good range of motion.  He had bilateral PIP and DIP thickening and incomplete extension of some of the PIP and DIP joints due to Dupuytren's contractures.  Hip joints and knee joints with good range of motion.  Minimal effusion was noted in bilateral knee joints.  There was no tenderness over ankles or MTPs.  Calluses were noted under metatarsals.  Hammertoes were noted bilaterally.  CDAI Exam: CDAI Score: -- Patient Global: --; Provider Global: -- Swollen: --; Tender: -- Joint Exam 01/29/2023   No joint exam has been documented for  this visit   There is currently no information documented on the homunculus. Go to the Rheumatology activity and complete the homunculus joint exam.  Investigation: No additional findings.  Imaging: No results found.  Recent Labs: Lab Results  Component Value Date   WBC 6.3 10/29/2022   HGB 12.5 (L) 10/29/2022   PLT 224 10/29/2022   NA 141 10/29/2022   K 4.1 10/29/2022   CL 107 10/29/2022   CO2 25 10/29/2022   GLUCOSE 101 (H) 10/29/2022   BUN 16 10/29/2022   CREATININE 1.25 10/29/2022   BILITOT 0.6 10/29/2022   ALKPHOS 65 01/28/2021   AST 23 10/29/2022   ALT 13 10/29/2022   PROT 7.5 10/29/2022   ALBUMIN 4.0 01/28/2021   CALCIUM 9.9 10/29/2022   GFRAA 45 (L) 04/30/2021    Speciality Comments: Allopurinol-nausea  Procedures:  No procedures performed Allergies: Other and Shellfish allergy  Assessment / Plan:     Visit Diagnoses: Idiopathic chronic gout of multiple sites without tophus - allopurinol 450 mg once daily.  He denies having a gout flare since the last visit.  He has been taking allopurinol on a regular basis.Uric acid on 10/29/22 was 5.8.  I will check uric acid level today.  Hyperuricemia  Medication monitoring encounter-October 29, 2022 CBC was stable with hemoglobin of 12.5.  CMP was stable.  Creatinine was better at 1.25.  Primary osteoarthritis of both shoulders-he had good range of motion in his shoulders with minimal discomfort.  He states he has been swimming which has been very helpful.  Primary osteoarthritis of both hands-he had bilateral PIP and DIP thickening with incomplete extension of his PIP joints.  Dupuytren's contractures.  Hand muscle strengthening exercises and joint protection was discussed.  He does gardening which is hard on his hands.  Dupuytren's contracture of both hands-unchanged.  Primary osteoarthritis of both knees-x-rays in 2022 showed bilateral moderate osteoarthritis and severe chondromalacia patella.  He continues to  have small effusions in his bilateral knee joints.  He states he had several aspiration of his knee joints in the past.  Currently he is not having much discomfort in his knee joints.  Primary osteoarthritis of both feet - X-ray showed erosive changes and osteoarthritis.  He continues to have some discomfort in his feet due to hammertoes.  He has calluses under his metatarsals.  Use of metatarsal pad was advised.  Patient wants to go to [facility] to get metatarsal pads.  DDD (degenerative disc disease), cervical-he had some limitation with range of motion without much discomfort today.  Positive ANA (antinuclear antibody) - Double-stranded DNA negative, Smith negative, anti-CCP negative.  He has no clinical features of systemic lupus.  Other medical problems are listed as follows:  Prurigo nodularis-he uses topical agents.  Uncontrolled type 2 diabetes mellitus with hyperglycemia (HCC)  Severe nonproliferative diabetic retinopathy of both eyes with macular edema associated with type 2 diabetes mellitus (HCC)  Nuclear sclerotic cataract of both eyes  Chronic renal impairment, stage 3a (HCC)-GFR improved   Regular alcohol consumption-patient states that he is cut back on alcohol consumption and has been drinking only 2 glasses of wine a week.  Orders: Orders Placed This Encounter  Procedures   CBC with Differential/Platelet   COMPLETE METABOLIC PANEL WITH GFR   Uric acid   No orders of the defined types were placed in this encounter.    Follow-Up Instructions: Return in about 6 months (around 08/01/2023) for Osteoarthritis, Gout.   Pollyann Savoy, MD  Note - This record has been created using Animal nutritionist.  Chart creation errors have been sought, but may not always  have been located. Such creation errors do not reflect on  the standard of medical care.

## 2023-01-19 DIAGNOSIS — E113412 Type 2 diabetes mellitus with severe nonproliferative diabetic retinopathy with macular edema, left eye: Secondary | ICD-10-CM | POA: Diagnosis not present

## 2023-01-29 ENCOUNTER — Ambulatory Visit: Payer: Medicare HMO | Attending: Physician Assistant | Admitting: Rheumatology

## 2023-01-29 ENCOUNTER — Encounter: Payer: Self-pay | Admitting: Rheumatology

## 2023-01-29 VITALS — BP 158/69 | HR 67 | Resp 15 | Ht 74.0 in | Wt 216.0 lb

## 2023-01-29 DIAGNOSIS — M19072 Primary osteoarthritis, left ankle and foot: Secondary | ICD-10-CM

## 2023-01-29 DIAGNOSIS — Z5181 Encounter for therapeutic drug level monitoring: Secondary | ICD-10-CM | POA: Diagnosis not present

## 2023-01-29 DIAGNOSIS — Z789 Other specified health status: Secondary | ICD-10-CM

## 2023-01-29 DIAGNOSIS — M5030 Other cervical disc degeneration, unspecified cervical region: Secondary | ICD-10-CM

## 2023-01-29 DIAGNOSIS — M19071 Primary osteoarthritis, right ankle and foot: Secondary | ICD-10-CM | POA: Diagnosis not present

## 2023-01-29 DIAGNOSIS — R768 Other specified abnormal immunological findings in serum: Secondary | ICD-10-CM | POA: Diagnosis not present

## 2023-01-29 DIAGNOSIS — M19041 Primary osteoarthritis, right hand: Secondary | ICD-10-CM | POA: Diagnosis not present

## 2023-01-29 DIAGNOSIS — M170 Bilateral primary osteoarthritis of knee: Secondary | ICD-10-CM

## 2023-01-29 DIAGNOSIS — E790 Hyperuricemia without signs of inflammatory arthritis and tophaceous disease: Secondary | ICD-10-CM

## 2023-01-29 DIAGNOSIS — L2089 Other atopic dermatitis: Secondary | ICD-10-CM | POA: Diagnosis not present

## 2023-01-29 DIAGNOSIS — N1831 Chronic kidney disease, stage 3a: Secondary | ICD-10-CM

## 2023-01-29 DIAGNOSIS — M19011 Primary osteoarthritis, right shoulder: Secondary | ICD-10-CM | POA: Diagnosis not present

## 2023-01-29 DIAGNOSIS — M1A09X0 Idiopathic chronic gout, multiple sites, without tophus (tophi): Secondary | ICD-10-CM | POA: Diagnosis not present

## 2023-01-29 DIAGNOSIS — L281 Prurigo nodularis: Secondary | ICD-10-CM

## 2023-01-29 DIAGNOSIS — E1165 Type 2 diabetes mellitus with hyperglycemia: Secondary | ICD-10-CM

## 2023-01-29 DIAGNOSIS — M19042 Primary osteoarthritis, left hand: Secondary | ICD-10-CM

## 2023-01-29 DIAGNOSIS — M720 Palmar fascial fibromatosis [Dupuytren]: Secondary | ICD-10-CM

## 2023-01-29 DIAGNOSIS — H2513 Age-related nuclear cataract, bilateral: Secondary | ICD-10-CM

## 2023-01-29 DIAGNOSIS — E113413 Type 2 diabetes mellitus with severe nonproliferative diabetic retinopathy with macular edema, bilateral: Secondary | ICD-10-CM

## 2023-01-29 DIAGNOSIS — M19012 Primary osteoarthritis, left shoulder: Secondary | ICD-10-CM

## 2023-01-30 LAB — CBC WITH DIFFERENTIAL/PLATELET
Absolute Monocytes: 589 cells/uL (ref 200–950)
Basophils Absolute: 19 cells/uL (ref 0–200)
Basophils Relative: 0.3 %
Eosinophils Absolute: 141 cells/uL (ref 15–500)
Eosinophils Relative: 2.2 %
HCT: 40 % (ref 38.5–50.0)
Hemoglobin: 12.9 g/dL — ABNORMAL LOW (ref 13.2–17.1)
Lymphs Abs: 1491 cells/uL (ref 850–3900)
MCH: 28.7 pg (ref 27.0–33.0)
MCHC: 32.3 g/dL (ref 32.0–36.0)
MCV: 89.1 fL (ref 80.0–100.0)
MPV: 11.8 fL (ref 7.5–12.5)
Monocytes Relative: 9.2 %
Neutro Abs: 4160 cells/uL (ref 1500–7800)
Neutrophils Relative %: 65 %
Platelets: 245 10*3/uL (ref 140–400)
RBC: 4.49 10*6/uL (ref 4.20–5.80)
RDW: 16.3 % — ABNORMAL HIGH (ref 11.0–15.0)
Total Lymphocyte: 23.3 %
WBC: 6.4 10*3/uL (ref 3.8–10.8)

## 2023-01-30 LAB — COMPLETE METABOLIC PANEL WITH GFR
AG Ratio: 1.1 (calc) (ref 1.0–2.5)
ALT: 12 U/L (ref 9–46)
AST: 20 U/L (ref 10–35)
Albumin: 3.9 g/dL (ref 3.6–5.1)
Alkaline phosphatase (APISO): 60 U/L (ref 35–144)
BUN/Creatinine Ratio: 16 (calc) (ref 6–22)
BUN: 25 mg/dL (ref 7–25)
CO2: 24 mmol/L (ref 20–32)
Calcium: 9.5 mg/dL (ref 8.6–10.3)
Chloride: 110 mmol/L (ref 98–110)
Creat: 1.6 mg/dL — ABNORMAL HIGH (ref 0.70–1.28)
Globulin: 3.4 g/dL (calc) (ref 1.9–3.7)
Glucose, Bld: 159 mg/dL — ABNORMAL HIGH (ref 65–99)
Potassium: 4 mmol/L (ref 3.5–5.3)
Sodium: 144 mmol/L (ref 135–146)
Total Bilirubin: 0.6 mg/dL (ref 0.2–1.2)
Total Protein: 7.3 g/dL (ref 6.1–8.1)
eGFR: 44 mL/min/{1.73_m2} — ABNORMAL LOW (ref >=60)

## 2023-01-30 LAB — URIC ACID: Uric Acid, Serum: 6.6 mg/dL (ref 4.0–8.0)

## 2023-01-30 NOTE — Progress Notes (Signed)
Hemoglobin is low and stable.  Glucose is elevated.  Creatinine is elevated at 1.6.  Please notify patient and forward results to his PCP.

## 2023-02-03 DIAGNOSIS — E113411 Type 2 diabetes mellitus with severe nonproliferative diabetic retinopathy with macular edema, right eye: Secondary | ICD-10-CM | POA: Diagnosis not present

## 2023-02-12 DIAGNOSIS — L2089 Other atopic dermatitis: Secondary | ICD-10-CM | POA: Diagnosis not present

## 2023-02-27 DIAGNOSIS — L209 Atopic dermatitis, unspecified: Secondary | ICD-10-CM | POA: Diagnosis not present

## 2023-03-10 DIAGNOSIS — H2512 Age-related nuclear cataract, left eye: Secondary | ICD-10-CM | POA: Diagnosis not present

## 2023-03-10 DIAGNOSIS — E113391 Type 2 diabetes mellitus with moderate nonproliferative diabetic retinopathy without macular edema, right eye: Secondary | ICD-10-CM | POA: Diagnosis not present

## 2023-03-10 DIAGNOSIS — H1045 Other chronic allergic conjunctivitis: Secondary | ICD-10-CM | POA: Diagnosis not present

## 2023-03-10 DIAGNOSIS — E113312 Type 2 diabetes mellitus with moderate nonproliferative diabetic retinopathy with macular edema, left eye: Secondary | ICD-10-CM | POA: Diagnosis not present

## 2023-03-10 DIAGNOSIS — H40023 Open angle with borderline findings, high risk, bilateral: Secondary | ICD-10-CM | POA: Diagnosis not present

## 2023-03-12 DIAGNOSIS — L2089 Other atopic dermatitis: Secondary | ICD-10-CM | POA: Diagnosis not present

## 2023-03-17 DIAGNOSIS — E113413 Type 2 diabetes mellitus with severe nonproliferative diabetic retinopathy with macular edema, bilateral: Secondary | ICD-10-CM | POA: Diagnosis not present

## 2023-03-17 DIAGNOSIS — H3562 Retinal hemorrhage, left eye: Secondary | ICD-10-CM | POA: Diagnosis not present

## 2023-03-17 DIAGNOSIS — H2513 Age-related nuclear cataract, bilateral: Secondary | ICD-10-CM | POA: Diagnosis not present

## 2023-03-17 DIAGNOSIS — H359 Unspecified retinal disorder: Secondary | ICD-10-CM | POA: Diagnosis not present

## 2023-03-17 LAB — HM DIABETES EYE EXAM

## 2023-03-21 ENCOUNTER — Other Ambulatory Visit: Payer: Self-pay | Admitting: Emergency Medicine

## 2023-03-26 DIAGNOSIS — L2089 Other atopic dermatitis: Secondary | ICD-10-CM | POA: Diagnosis not present

## 2023-04-07 DIAGNOSIS — E113411 Type 2 diabetes mellitus with severe nonproliferative diabetic retinopathy with macular edema, right eye: Secondary | ICD-10-CM | POA: Diagnosis not present

## 2023-04-14 DIAGNOSIS — L2089 Other atopic dermatitis: Secondary | ICD-10-CM | POA: Diagnosis not present

## 2023-04-19 ENCOUNTER — Other Ambulatory Visit: Payer: Self-pay | Admitting: Emergency Medicine

## 2023-04-19 DIAGNOSIS — E1169 Type 2 diabetes mellitus with other specified complication: Secondary | ICD-10-CM

## 2023-04-24 DIAGNOSIS — H25812 Combined forms of age-related cataract, left eye: Secondary | ICD-10-CM | POA: Diagnosis not present

## 2023-04-27 ENCOUNTER — Telehealth: Payer: Self-pay | Admitting: Emergency Medicine

## 2023-04-27 ENCOUNTER — Other Ambulatory Visit: Payer: Self-pay | Admitting: *Deleted

## 2023-04-27 MED ORDER — SILDENAFIL CITRATE 100 MG PO TABS: ORAL_TABLET | ORAL | 0 refills | Status: DC

## 2023-04-27 NOTE — Telephone Encounter (Signed)
Sent new rx to patient pharmacy

## 2023-04-27 NOTE — Telephone Encounter (Signed)
Prescription Request  04/27/2023  LOV: 11/05/2022  What is the name of the medication or equipment? sildenafil (VIAGRA) 100 MG tablet   Have you contacted your pharmacy to request a refill? No   Which pharmacy would you like this sent to?  [id number] - [place], [place] - [address] [place] [address] Phone: [phone] Fax: [phone]    Patient notified that their request is being sent to the clinical staff for review and that they should receive a response within 2 business days.   Please advise at Mobile [phone] (mobile)    Next OV is 06/24/2023.

## 2023-04-28 DIAGNOSIS — H359 Unspecified retinal disorder: Secondary | ICD-10-CM | POA: Diagnosis not present

## 2023-04-28 DIAGNOSIS — H3562 Retinal hemorrhage, left eye: Secondary | ICD-10-CM | POA: Diagnosis not present

## 2023-04-28 DIAGNOSIS — E113411 Type 2 diabetes mellitus with severe nonproliferative diabetic retinopathy with macular edema, right eye: Secondary | ICD-10-CM | POA: Diagnosis not present

## 2023-04-28 DIAGNOSIS — E113412 Type 2 diabetes mellitus with severe nonproliferative diabetic retinopathy with macular edema, left eye: Secondary | ICD-10-CM | POA: Diagnosis not present

## 2023-04-29 DIAGNOSIS — Z961 Presence of intraocular lens: Secondary | ICD-10-CM | POA: Diagnosis not present

## 2023-04-29 DIAGNOSIS — H2511 Age-related nuclear cataract, right eye: Secondary | ICD-10-CM | POA: Diagnosis not present

## 2023-04-30 DIAGNOSIS — L2089 Other atopic dermatitis: Secondary | ICD-10-CM | POA: Diagnosis not present

## 2023-05-14 DIAGNOSIS — L209 Atopic dermatitis, unspecified: Secondary | ICD-10-CM | POA: Diagnosis not present

## 2023-05-15 DIAGNOSIS — H25811 Combined forms of age-related cataract, right eye: Secondary | ICD-10-CM | POA: Diagnosis not present

## 2023-05-18 ENCOUNTER — Other Ambulatory Visit: Payer: Self-pay | Admitting: Emergency Medicine

## 2023-05-19 DIAGNOSIS — E113411 Type 2 diabetes mellitus with severe nonproliferative diabetic retinopathy with macular edema, right eye: Secondary | ICD-10-CM | POA: Diagnosis not present

## 2023-05-19 DIAGNOSIS — E113412 Type 2 diabetes mellitus with severe nonproliferative diabetic retinopathy with macular edema, left eye: Secondary | ICD-10-CM | POA: Diagnosis not present

## 2023-06-01 DIAGNOSIS — L2089 Other atopic dermatitis: Secondary | ICD-10-CM | POA: Diagnosis not present

## 2023-06-04 ENCOUNTER — Other Ambulatory Visit: Payer: Self-pay | Admitting: Emergency Medicine

## 2023-06-11 DIAGNOSIS — E113412 Type 2 diabetes mellitus with severe nonproliferative diabetic retinopathy with macular edema, left eye: Secondary | ICD-10-CM | POA: Diagnosis not present

## 2023-06-11 DIAGNOSIS — H3562 Retinal hemorrhage, left eye: Secondary | ICD-10-CM | POA: Diagnosis not present

## 2023-06-11 DIAGNOSIS — H35372 Puckering of macula, left eye: Secondary | ICD-10-CM | POA: Diagnosis not present

## 2023-06-15 DIAGNOSIS — L2089 Other atopic dermatitis: Secondary | ICD-10-CM | POA: Diagnosis not present

## 2023-06-23 DIAGNOSIS — H35372 Puckering of macula, left eye: Secondary | ICD-10-CM | POA: Diagnosis not present

## 2023-06-23 DIAGNOSIS — H3562 Retinal hemorrhage, left eye: Secondary | ICD-10-CM | POA: Diagnosis not present

## 2023-06-23 DIAGNOSIS — E113411 Type 2 diabetes mellitus with severe nonproliferative diabetic retinopathy with macular edema, right eye: Secondary | ICD-10-CM | POA: Diagnosis not present

## 2023-06-23 DIAGNOSIS — E113413 Type 2 diabetes mellitus with severe nonproliferative diabetic retinopathy with macular edema, bilateral: Secondary | ICD-10-CM | POA: Diagnosis not present

## 2023-06-23 LAB — HM DIABETES EYE EXAM

## 2023-06-24 ENCOUNTER — Encounter: Payer: Self-pay | Admitting: Emergency Medicine

## 2023-06-24 ENCOUNTER — Other Ambulatory Visit: Payer: Self-pay | Admitting: Emergency Medicine

## 2023-06-24 ENCOUNTER — Ambulatory Visit (INDEPENDENT_AMBULATORY_CARE_PROVIDER_SITE_OTHER): Payer: Medicare HMO | Admitting: Emergency Medicine

## 2023-06-24 VITALS — BP 130/72 | HR 51 | Temp 97.9°F | Ht 74.0 in | Wt 214.1 lb

## 2023-06-24 DIAGNOSIS — E1165 Type 2 diabetes mellitus with hyperglycemia: Secondary | ICD-10-CM | POA: Diagnosis not present

## 2023-06-24 DIAGNOSIS — N1831 Chronic kidney disease, stage 3a: Secondary | ICD-10-CM | POA: Diagnosis not present

## 2023-06-24 DIAGNOSIS — R1013 Epigastric pain: Secondary | ICD-10-CM | POA: Insufficient documentation

## 2023-06-24 DIAGNOSIS — E1121 Type 2 diabetes mellitus with diabetic nephropathy: Secondary | ICD-10-CM

## 2023-06-24 DIAGNOSIS — L2089 Other atopic dermatitis: Secondary | ICD-10-CM | POA: Diagnosis not present

## 2023-06-24 DIAGNOSIS — Z8739 Personal history of other diseases of the musculoskeletal system and connective tissue: Secondary | ICD-10-CM

## 2023-06-24 LAB — POCT GLYCOSYLATED HEMOGLOBIN (HGB A1C): Hemoglobin A1C: 6.1 % — AB (ref 4.0–5.6)

## 2023-06-24 LAB — COMPREHENSIVE METABOLIC PANEL
ALT: 13 U/L (ref 0–53)
AST: 18 U/L (ref 0–37)
Albumin: 4 g/dL (ref 3.5–5.2)
Alkaline Phosphatase: 57 U/L (ref 39–117)
BUN: 22 mg/dL (ref 6–23)
CO2: 26 mEq/L (ref 19–32)
Calcium: 9.8 mg/dL (ref 8.4–10.5)
Chloride: 106 mEq/L (ref 96–112)
Creatinine, Ser: 1.48 mg/dL (ref 0.40–1.50)
GFR: 44.98 mL/min — ABNORMAL LOW (ref >60.00)
Glucose, Bld: 126 mg/dL — ABNORMAL HIGH (ref 70–99)
Potassium: 4.3 mEq/L (ref 3.5–5.1)
Sodium: 139 mEq/L (ref 135–145)
Total Bilirubin: 0.4 mg/dL (ref 0.2–1.2)
Total Protein: 7.4 g/dL (ref 6.0–8.3)

## 2023-06-24 LAB — MICROALBUMIN / CREATININE URINE RATIO
Creatinine,U: 142.6 mg/dL
Microalb Creat Ratio: 31.3 mg/g — ABNORMAL HIGH (ref 0.0–30.0)
Microalb, Ur: 44.6 mg/dL — ABNORMAL HIGH (ref 0.0–1.9)

## 2023-06-24 LAB — LIPID PANEL
Cholesterol: 200 mg/dL (ref 0–200)
HDL: 79.6 mg/dL (ref >39.00)
LDL Cholesterol: 108 mg/dL — ABNORMAL HIGH (ref 0–99)
NonHDL: 120.5
Total CHOL/HDL Ratio: 3
Triglycerides: 64 mg/dL (ref 0.0–149.0)
VLDL: 12.8 mg/dL (ref 0.0–40.0)

## 2023-06-24 MED ORDER — KERENDIA 10 MG PO TABS: 10.0000 mg | ORAL_TABLET | Freq: Every day | ORAL | 3 refills | Status: DC

## 2023-06-24 NOTE — Assessment & Plan Note (Signed)
Active and affecting quality of life.  Was frequent user of Zantac.  Concerned about stomach cancer.  Requesting referral to GI. Referral placed today.

## 2023-06-24 NOTE — Assessment & Plan Note (Signed)
Stable with no recent flareups. Continue allopurinol 300 mg daily

## 2023-06-24 NOTE — Progress Notes (Signed)
Steven Knapp 77 y.o.   Chief Complaint  Patient presents with   Medical Management of Chronic Issues    F/u appt, patient wants a referral to see GI    HISTORY OF PRESENT ILLNESS: This is a 77 y.o. male here for follow-up of chronic medical problems including diabetes. Overall doing well.  Concerned about stomach cancer as he was a frequent user of Zantac in the past.  Requesting referral to GI for this reason.  Has also been having bloating and burping.  Denies heartburn, nausea, or vomiting.  Denies melena or rectal bleeding. Lab Results  Component Value Date   HGBA1C 6.9 (H) 10/29/2022   BP Readings from Last 3 Encounters:  06/24/23 130/72  01/29/23 (!) 158/69  11/05/22 130/76   Wt Readings from Last 3 Encounters:  06/24/23 214 lb 2 oz (97.1 kg)  01/29/23 216 lb (98 kg)  11/05/22 211 lb (95.7 kg)     HPI   Prior to Admission medications   Medication Sig Start Date End Date Taking? Authorizing Provider  allopurinol (ZYLOPRIM) 300 MG tablet Take 1.5 tablets (450 mg total) by mouth daily. 09/17/22  Yes Deveshwar, Janalyn Rouse, MD  Arginine 500 MG CAPS Take by mouth daily.   Yes [provider]  BEPREVE 1.5 % SOLN 1 drop 2 (two) times daily. 02/29/20  Yes [provider]  blood glucose meter kit and supplies Dispense based on patient and insurance preference. Use up to four times daily as directed. (FOR ICD-10 E10.9, E11.9). 02/07/21  Yes , Eilleen Kempf, MD  clobetasol cream (TEMOVATE) 0.05 % Apply 1 application  topically 2 (two) times daily.   Yes [provider]  colchicine 0.6 MG tablet Take 1 tablet (0.6 mg total) by mouth daily as needed. 04/18/22  Yes Gearldine Bienenstock, PA-C  Continuous Blood Gluc Sensor (FREESTYLE LIBRE SENSOR SYSTEM) MISC 1 Device by Does not apply route daily. 03/07/21  Yes , Eilleen Kempf, MD  Dupilumab (DUPIXENT [place]) Inject into the skin.   Yes [provider]  glipiZIDE (GLUCOTROL) 5 MG tablet TAKE ONE TABLET BY  MOUTH TWICE DAILY BEFORE MEALS 04/19/23  Yes , Eilleen Kempf, MD  ibuprofen (ADVIL) 200 MG tablet Take 200 mg by mouth every 6 (six) hours as needed for moderate pain.   Yes [provider]  rosuvastatin (CRESTOR) 10 MG tablet Take 1 tablet (10 mg total) by mouth daily. 07/30/20  Yes Georgina Quint, MD  sildenafil (VIAGRA) 100 MG tablet Take one-half to one tablet by mouth daily as needed for erectile dysfunction 06/04/23  Yes , Eilleen Kempf, MD  SYSTANE ULTRA 0.4-0.3 % SOLN SMARTSIG:1 Drop(s) In Eye(s) As Needed 11/23/19  Yes [provider]    Allergies  Allergen Reactions   Other Hives and Itching    strawberries, wine, and corn   Shellfish Allergy     Patient Active Problem List   Diagnosis Date Noted   Primary osteoarthritis of both shoulders 05/24/2021   Primary osteoarthritis of both hands 05/24/2021   Primary osteoarthritis of both feet 05/24/2021   DDD (degenerative disc disease), cervical 05/24/2021   Prurigo nodularis 05/24/2021   Chronic renal impairment, stage 3a (HCC) 05/24/2021   Uncontrolled type 2 diabetes mellitus with hyperglycemia (HCC) 07/30/2020   Severe nonproliferative diabetic retinopathy of left eye, with macular edema, associated with type 2 diabetes mellitus (HCC) 02/21/2020   Severe nonproliferative diabetic retinopathy of right eye, with macular edema, associated with type 2 diabetes mellitus (HCC) 02/21/2020  Nuclear sclerotic cataract of both eyes 02/21/2020   Retinal hemorrhage of left eye 02/21/2020   Retinal exudates and deposits 02/21/2020   Alternating exotropia 02/21/2020   Diabetes mellitus (HCC) 10/06/2019   Spinal stenosis 10/06/2019   History of gout 10/06/2019   History of rheumatoid arthritis 10/06/2019   Osteoarthritis of wrist 11/05/2018   Gout 09/16/2018    Past Medical History:  Diagnosis Date   Arthritis    Cataract    Diabetes mellitus without complication (HCC)    Spinal stenosis    per  patient    No past surgical history on file.  Social History   Socioeconomic History   Marital status: Single    Spouse name: Not on file   Number of children: Not on file   Years of education: Not on file   Highest education level: Not on file  Occupational History   Not on file  Tobacco Use   Smoking status: Former    Types: Cigarettes    Passive exposure: Past   Smokeless tobacco: Never  Vaping Use   Vaping status: Never Used  Substance and Sexual Activity   Alcohol use: Yes    Comment: occ   Drug use: Never   Sexual activity: Not on file  Other Topics Concern   Not on file  Social History Narrative   Not on file   Social Determinants of Health   Financial Resource Strain: Not on file  Food Insecurity: Not on file  Transportation Needs: Not on file  Physical Activity: Not on file  Stress: Not on file  Social Connections: Not on file  Intimate Partner Violence: Not on file    Family History  Problem Relation Age of Onset   Diabetes Mother    Hypertension Mother    Diabetes Brother    Healthy Brother    Healthy Daughter    Healthy Daughter    Healthy Son    Healthy Son    Scoliosis Son      Review of Systems  Constitutional: Negative.  Negative for chills and fever.  HENT: Negative.  Negative for congestion and sore throat.   Respiratory: Negative.  Negative for cough and shortness of breath.   Cardiovascular: Negative.  Negative for chest pain and palpitations.  Gastrointestinal:  Negative for abdominal pain, nausea and vomiting.  Skin: Negative.  Negative for rash.  Neurological: Negative.  Negative for dizziness and headaches.  All other systems reviewed and are negative.   Today's Vitals   06/24/23 0918  BP: 130/72  Pulse: (!) 51  Temp: 97.9 F (36.6 C)  TempSrc: Oral  SpO2: 98%  Weight: 214 lb 2 oz (97.1 kg)  Height: 6\' 2"  (1.88 m)   Body mass index is 27.49 kg/m.   Physical Exam Vitals reviewed.  Constitutional:       Appearance: Normal appearance.  HENT:     Head: Normocephalic.     Mouth/Throat:     Mouth: Mucous membranes are moist.     Pharynx: Oropharynx is clear.  Eyes:     Extraocular Movements: Extraocular movements intact.     Pupils: Pupils are equal, round, and reactive to light.  Cardiovascular:     Rate and Rhythm: Normal rate and regular rhythm.     Pulses: Normal pulses.     Heart sounds: Normal heart sounds.  Pulmonary:     Effort: Pulmonary effort is normal.     Breath sounds: Normal breath sounds.  Abdominal:  Palpations: Abdomen is soft.     Tenderness: There is no abdominal tenderness.  Skin:    General: Skin is warm and dry.  Neurological:     General: No focal deficit present.     Mental Status: He is alert and oriented to person, place, and time.  Psychiatric:        Mood and Affect: Mood normal.        Behavior: Behavior normal.   Results for orders placed or performed in visit on 06/24/23 (from the past 24 hour(s))  POCT glycosylated hemoglobin (Hb A1C)     Status: Abnormal   Collection Time: 06/24/23 10:04 AM  Result Value Ref Range   Hemoglobin A1C 6.1 (A) 4.0 - 5.6 %   HbA1c POC (<> result, manual entry)     HbA1c, POC (prediabetic range)     HbA1c, POC (controlled diabetic range)       ASSESSMENT & PLAN: A total of 45 minutes was spent with the patient and counseling/coordination of care regarding preparing for this visit, review of most recent office visit notes, review of chronic medical conditions under management, review of all medications, review of most recent blood work results including interpretation of today's hemoglobin A1c, cardiovascular risks associated with diabetes, education on nutrition, prognosis, documentation, and need for follow-up.  Problem List Items Addressed This Visit       Endocrine   Uncontrolled type 2 diabetes mellitus with hyperglycemia (HCC) - Primary    Well-controlled diabetes with hemoglobin A1c of 6.1 Eating well.   Diet and nutrition discussed. Continue glipizide 5 mg twice a day Cardiovascular risks associated with diabetes discussed      Relevant Orders   POCT glycosylated hemoglobin (Hb A1C)   Urine Microalbumin w/creat. ratio   Comprehensive metabolic panel   Lipid panel     Genitourinary   Chronic renal impairment, stage 3a (HCC)    Advised to stay well-hydrated and avoid NSAIDs Chronic stable condition      Relevant Orders   Urine Microalbumin w/creat. ratio   Comprehensive metabolic panel     Other   History of gout    Stable with no recent flareups. Continue allopurinol 300 mg daily      Dyspepsia    Active and affecting quality of life.  Was frequent user of Zantac.  Concerned about stomach cancer.  Requesting referral to GI. Referral placed today.      Relevant Orders   Ambulatory referral to Gastroenterology   Patient Instructions  Health Maintenance After Age 102 After age 41, you are at a higher risk for certain long-term diseases and infections as well as injuries from falls. Falls are a major cause of broken bones and head injuries in people who are older than age 38. Getting regular preventive care can help to keep you healthy and well. Preventive care includes getting regular testing and making lifestyle changes as recommended by your health care provider. Talk with your health care provider about: Which screenings and tests you should have. A screening is a test that checks for a disease when you have no symptoms. A diet and exercise plan that is right for you. What should I know about screenings and tests to prevent falls? Screening and testing are the best ways to find a health problem early. Early diagnosis and treatment give you the best chance of managing medical conditions that are common after age 31. Certain conditions and lifestyle choices may make you more likely to have a fall.  Your health care provider may recommend: Regular vision checks. Poor vision and  conditions such as cataracts can make you more likely to have a fall. If you wear glasses, make sure to get your prescription updated if your vision changes. Medicine review. Work with your health care provider to regularly review all of the medicines you are taking, including over-the-counter medicines. Ask your health care provider about any side effects that may make you more likely to have a fall. Tell your health care provider if any medicines that you take make you feel dizzy or sleepy. Strength and balance checks. Your health care provider may recommend certain tests to check your strength and balance while standing, walking, or changing positions. Foot health exam. Foot pain and numbness, as well as not wearing proper footwear, can make you more likely to have a fall. Screenings, including: Osteoporosis screening. Osteoporosis is a condition that causes the bones to get weaker and break more easily. Blood pressure screening. Blood pressure changes and medicines to control blood pressure can make you feel dizzy. Depression screening. You may be more likely to have a fall if you have a fear of falling, feel depressed, or feel unable to do activities that you used to do. Alcohol use screening. Using too much alcohol can affect your balance and may make you more likely to have a fall. Follow these instructions at home: Lifestyle Do not drink alcohol if: Your health care provider tells you not to drink. If you drink alcohol: Limit how much you have to: 0-1 drink a day for women. 0-2 drinks a day for men. Know how much alcohol is in your drink. In the [place] one drink equals one 12 oz bottle of beer (355 mL), one 5 oz glass of wine (148 mL), or one 1 oz glass of hard liquor (44 mL). Do not use any products that contain nicotine or tobacco. These products include cigarettes, chewing tobacco, and vaping devices, such as e-cigarettes. If you need help quitting, ask your health care  provider. Activity  Follow a regular exercise program to stay fit. This will help you maintain your balance. Ask your health care provider what types of exercise are appropriate for you. If you need a cane or walker, use it as recommended by your health care provider. Wear supportive shoes that have nonskid soles. Safety  Remove any tripping hazards, such as rugs, cords, and clutter. Install safety equipment such as grab bars in bathrooms and safety rails on stairs. Keep rooms and walkways well-lit. General instructions Talk with your health care provider about your risks for falling. Tell your health care provider if: You fall. Be sure to tell your health care provider about all falls, even ones that seem minor. You feel dizzy, tiredness (fatigue), or off-balance. Take over-the-counter and prescription medicines only as told by your health care provider. These include supplements. Eat a healthy diet and maintain a healthy weight. A healthy diet includes low-fat dairy products, low-fat (lean) meats, and fiber from whole grains, beans, and lots of fruits and vegetables. Stay current with your vaccines. Schedule regular health, dental, and eye exams. Summary Having a healthy lifestyle and getting preventive care can help to protect your health and wellness after age 36. Screening and testing are the best way to find a health problem early and help you avoid having a fall. Early diagnosis and treatment give you the best chance for managing medical conditions that are more common for people who are older than age 30.  Falls are a major cause of broken bones and head injuries in people who are older than age 24. Take precautions to prevent a fall at home. Work with your health care provider to learn what changes you can make to improve your health and wellness and to prevent falls. This information is not intended to replace advice given to you by your health care provider. Make sure you discuss  any questions you have with your health care provider. Document Revised: 03/25/2021 Document Reviewed: 03/25/2021 Elsevier Patient Education  2024 Elsevier Inc.       Edwina Barth, MD [facility]

## 2023-06-24 NOTE — Patient Instructions (Signed)
Health Maintenance After Age 79 After age 79, you are at a higher risk for certain long-term diseases and infections as well as injuries from falls. Falls are a major cause of broken bones and head injuries in people who are older than age 79. Getting regular preventive care can help to keep you healthy and well. Preventive care includes getting regular testing and making lifestyle changes as recommended by your health care provider. Talk with your health care provider about: Which screenings and tests you should have. A screening is a test that checks for a disease when you have no symptoms. A diet and exercise plan that is right for you. What should I know about screenings and tests to prevent falls? Screening and testing are the best ways to find a health problem early. Early diagnosis and treatment give you the best chance of managing medical conditions that are common after age 79. Certain conditions and lifestyle choices may make you more likely to have a fall. Your health care provider may recommend: Regular vision checks. Poor vision and conditions such as cataracts can make you more likely to have a fall. If you wear glasses, make sure to get your prescription updated if your vision changes. Medicine review. Work with your health care provider to regularly review all of the medicines you are taking, including over-the-counter medicines. Ask your health care provider about any side effects that may make you more likely to have a fall. Tell your health care provider if any medicines that you take make you feel dizzy or sleepy. Strength and balance checks. Your health care provider may recommend certain tests to check your strength and balance while standing, walking, or changing positions. Foot health exam. Foot pain and numbness, as well as not wearing proper footwear, can make you more likely to have a fall. Screenings, including: Osteoporosis screening. Osteoporosis is a condition that causes  the bones to get weaker and break more easily. Blood pressure screening. Blood pressure changes and medicines to control blood pressure can make you feel dizzy. Depression screening. You may be more likely to have a fall if you have a fear of falling, feel depressed, or feel unable to do activities that you used to do. Alcohol use screening. Using too much alcohol can affect your balance and may make you more likely to have a fall. Follow these instructions at home: Lifestyle Do not drink alcohol if: Your health care provider tells you not to drink. If you drink alcohol: Limit how much you have to: 0-1 drink a day for women. 0-2 drinks a day for men. Know how much alcohol is in your drink. In the [place] one drink equals one 12 oz bottle of beer (355 mL), one 5 oz glass of wine (148 mL), or one 1 oz glass of hard liquor (44 mL). Do not use any products that contain nicotine or tobacco. These products include cigarettes, chewing tobacco, and vaping devices, such as e-cigarettes. If you need help quitting, ask your health care provider. Activity  Follow a regular exercise program to stay fit. This will help you maintain your balance. Ask your health care provider what types of exercise are appropriate for you. If you need a cane or walker, use it as recommended by your health care provider. Wear supportive shoes that have nonskid soles. Safety  Remove any tripping hazards, such as rugs, cords, and clutter. Install safety equipment such as grab bars in bathrooms and safety rails on stairs. Keep rooms and walkways   well-lit. General instructions Talk with your health care provider about your risks for falling. Tell your health care provider if: You fall. Be sure to tell your health care provider about all falls, even ones that seem minor. You feel dizzy, tiredness (fatigue), or off-balance. Take over-the-counter and prescription medicines only as told by your health care provider. These include  supplements. Eat a healthy diet and maintain a healthy weight. A healthy diet includes low-fat dairy products, low-fat (lean) meats, and fiber from whole grains, beans, and lots of fruits and vegetables. Stay current with your vaccines. Schedule regular health, dental, and eye exams. Summary Having a healthy lifestyle and getting preventive care can help to protect your health and wellness after age 79. Screening and testing are the best way to find a health problem early and help you avoid having a fall. Early diagnosis and treatment give you the best chance for managing medical conditions that are more common for people who are older than age 79. Falls are a major cause of broken bones and head injuries in people who are older than age 79. Take precautions to prevent a fall at home. Work with your health care provider to learn what changes you can make to improve your health and wellness and to prevent falls. This information is not intended to replace advice given to you by your health care provider. Make sure you discuss any questions you have with your health care provider. Document Revised: 03/25/2021 Document Reviewed: 03/25/2021 Elsevier Patient Education  2024 Elsevier Inc.  

## 2023-06-24 NOTE — Assessment & Plan Note (Signed)
Well-controlled diabetes with hemoglobin A1c of 6.1 Eating well.  Diet and nutrition discussed. Continue glipizide 5 mg twice a day Cardiovascular risks associated with diabetes discussed

## 2023-06-24 NOTE — Assessment & Plan Note (Signed)
Advised to stay well-hydrated and avoid NSAIDs Chronic stable condition

## 2023-06-26 ENCOUNTER — Telehealth: Payer: Self-pay

## 2023-06-26 NOTE — Telephone Encounter (Signed)
*  Primary  Pharmacy Patient Advocate Encounter   Received notification from CoverMyMeds that prior authorization for Kerendia 10MG  tablets  is required/requested.   Insurance verification completed.   The patient is insured through [employer] .   Per test claim: PA required; PA submitted to [facility] via CoverMyMeds Key/confirmation #/[id number] [facility] Status is pending

## 2023-07-02 DIAGNOSIS — E113312 Type 2 diabetes mellitus with moderate nonproliferative diabetic retinopathy with macular edema, left eye: Secondary | ICD-10-CM | POA: Diagnosis not present

## 2023-07-02 DIAGNOSIS — E113391 Type 2 diabetes mellitus with moderate nonproliferative diabetic retinopathy without macular edema, right eye: Secondary | ICD-10-CM | POA: Diagnosis not present

## 2023-07-02 DIAGNOSIS — Z961 Presence of intraocular lens: Secondary | ICD-10-CM | POA: Diagnosis not present

## 2023-07-03 NOTE — Telephone Encounter (Signed)
Please advise Chauncey Mann was denied... insurance recommend:Patient to be currently receiving the maximally tolerated dose of one of the following unless you cannot use either therapy: an angiotensin-converting enzyme inhibitor (for example: lisinopril) or an angiotensin receptor  blocker (for example: losartan).

## 2023-07-03 NOTE — Telephone Encounter (Signed)
Pharmacy Patient Advocate Encounter  Received notification from [facility] that Prior Authorization for Steven Knapp has been DENIED. Please advise how you'd like to proceed. Full denial letter will be uploaded to the media tab. See denial reason below.   We're writing to share some news about your recent prior authorization prescription request. Unfortunately, your coverage or payment under your Medicare Part D benefit for KERENDIA 10 MG TABLET was denied.Here's why we made our decision: We cover this drug when our criteria are met. The unmet criteria are: you are currently receiving the maximally tolerated dose of one of the following unless you cannot use either therapy: an angiotensin-converting enzyme inhibitor (for example: lisinopril) or an angiotensin receptor  blocker (for example: losartan). This decision was from [employer] [place] [employer] Pharmacy Coverage Policy.

## 2023-07-09 DIAGNOSIS — E113412 Type 2 diabetes mellitus with severe nonproliferative diabetic retinopathy with macular edema, left eye: Secondary | ICD-10-CM | POA: Diagnosis not present

## 2023-07-09 DIAGNOSIS — H359 Unspecified retinal disorder: Secondary | ICD-10-CM | POA: Diagnosis not present

## 2023-07-09 DIAGNOSIS — H35372 Puckering of macula, left eye: Secondary | ICD-10-CM | POA: Diagnosis not present

## 2023-07-09 DIAGNOSIS — H3562 Retinal hemorrhage, left eye: Secondary | ICD-10-CM | POA: Diagnosis not present

## 2023-07-16 DIAGNOSIS — L2089 Other atopic dermatitis: Secondary | ICD-10-CM | POA: Diagnosis not present

## 2023-07-27 ENCOUNTER — Other Ambulatory Visit: Payer: Self-pay | Admitting: Emergency Medicine

## 2023-07-27 DIAGNOSIS — E1169 Type 2 diabetes mellitus with other specified complication: Secondary | ICD-10-CM

## 2023-07-28 DIAGNOSIS — E113411 Type 2 diabetes mellitus with severe nonproliferative diabetic retinopathy with macular edema, right eye: Secondary | ICD-10-CM | POA: Diagnosis not present

## 2023-07-28 DIAGNOSIS — H359 Unspecified retinal disorder: Secondary | ICD-10-CM | POA: Diagnosis not present

## 2023-07-28 DIAGNOSIS — H3562 Retinal hemorrhage, left eye: Secondary | ICD-10-CM | POA: Diagnosis not present

## 2023-07-28 DIAGNOSIS — H35372 Puckering of macula, left eye: Secondary | ICD-10-CM | POA: Diagnosis not present

## 2023-07-28 NOTE — Progress Notes (Signed)
Office Visit Note  Patient: Steven Knapp             Date of Birth: [birth year]           MRN: [id number]             PCP: Georgina Quint, MD Referring: Georgina Quint, * Visit Date: 08/06/2023 Occupation: @GUAROCC @  Subjective:  Joint stiffness  History of Present Illness: Steven Knapp is a 77 y.o. male with history of gout, osteoarthritis and degenerative disc disease.  He has not had a flare of gout since the last visit.  He has been taking allopurinol 300 mg, 1-1/2 tablet daily.  He has been experiencing a stiffness in his hands and decreased grip strength.  None of the other joints are painful currently.  He had bilateral cataract surgeries in June and July 2024 respectively.  He states he is dealing with macular degeneration.    Activities of Daily Living:  Patient reports morning stiffness for 10 minutes.   Patient Denies nocturnal pain.  Difficulty dressing/grooming: Denies Difficulty climbing stairs: Denies Difficulty getting out of chair: Denies Difficulty using hands for taps, buttons, cutlery, and/or writing: Reports  Review of Systems  Constitutional:  Positive for fatigue.  HENT:  Negative for mouth sores and mouth dryness.   Eyes:  Negative for dryness.  Respiratory:  Negative for shortness of breath.   Cardiovascular:  Negative for chest pain and palpitations.  Gastrointestinal:  Negative for blood in stool, constipation and diarrhea.  Endocrine: Negative for increased urination.  Genitourinary:  Positive for urgency. Negative for painful urination and involuntary urination.  Musculoskeletal:  Positive for gait problem, muscle weakness and morning stiffness. Negative for joint pain, joint pain, joint swelling, myalgias, muscle tenderness and myalgias.  Skin:  Positive for rash. Negative for color change, hair loss and sensitivity to sunlight.  Allergic/Immunologic: Negative for susceptible to infections.  Neurological:  Negative for dizziness and  headaches.  Hematological:  Negative for swollen glands.  Psychiatric/Behavioral:  Negative for depressed mood and sleep disturbance. The patient is not nervous/anxious.     PMFS History:  Patient Active Problem List   Diagnosis Date Noted   Dyspepsia 06/24/2023   Primary osteoarthritis of both shoulders 05/24/2021   Primary osteoarthritis of both hands 05/24/2021   Primary osteoarthritis of both feet 05/24/2021   DDD (degenerative disc disease), cervical 05/24/2021   Prurigo nodularis 05/24/2021   Chronic renal impairment, stage 3a (HCC) 05/24/2021   Uncontrolled type 2 diabetes mellitus with hyperglycemia (HCC) 07/30/2020   Severe nonproliferative diabetic retinopathy of left eye, with macular edema, associated with type 2 diabetes mellitus (HCC) 02/21/2020   Severe nonproliferative diabetic retinopathy of right eye, with macular edema, associated with type 2 diabetes mellitus (HCC) 02/21/2020   Nuclear sclerotic cataract of both eyes 02/21/2020   Retinal hemorrhage of left eye 02/21/2020   Retinal exudates and deposits 02/21/2020   Alternating exotropia 02/21/2020   Diabetes mellitus (HCC) 10/06/2019   Spinal stenosis 10/06/2019   History of gout 10/06/2019   History of rheumatoid arthritis 10/06/2019   Osteoarthritis of wrist 11/05/2018   Gout 09/16/2018    Past Medical History:  Diagnosis Date   Arthritis    Cataract    Diabetes mellitus without complication (HCC)    Spinal stenosis    per patient    Family History  Problem Relation Age of Onset   Diabetes Mother    Hypertension Mother    Diabetes Brother    Healthy Brother  Healthy Daughter    Healthy Daughter    Healthy Son    Healthy Son    Scoliosis Son    Past Surgical History:  Procedure Laterality Date   CATARACT EXTRACTION, BILATERAL     04/2023, 05/2023   Social History   Social History Narrative   Not on file   Immunization History  Administered Date(s) Administered   Fluad Quad(high Dose  65+) 07/30/2020, 08/01/2022   Moderna Sars-Covid-2 Vaccination 11/28/2019, 12/29/2019, 07/18/2020   PNEUMOCOCCAL CONJUGATE-20 05/06/2022   Pneumococcal Polysaccharide-23 10/06/2019   Zoster Recombinant(Shingrix) 05/06/2022     Objective: Vital Signs: BP 119/69 (BP Location: Left Arm, Patient Position: Sitting, Cuff Size: Large)   Pulse 60   Resp 16   Ht 6' 2.5" (1.892 m)   Wt 211 lb 6.4 oz (95.9 kg)   BMI 26.78 kg/m    Physical Exam Vitals and nursing note reviewed.  Constitutional:      Appearance: He is well-developed.  HENT:     Head: Normocephalic and atraumatic.  Eyes:     Conjunctiva/sclera: Conjunctivae normal.     Pupils: Pupils are equal, round, and reactive to light.  Cardiovascular:     Rate and Rhythm: Normal rate and regular rhythm.     Heart sounds: Normal heart sounds.  Pulmonary:     Effort: Pulmonary effort is normal.     Breath sounds: Normal breath sounds.  Abdominal:     General: Bowel sounds are normal.     Palpations: Abdomen is soft.  Musculoskeletal:     Cervical back: Normal range of motion and neck supple.  Skin:    General: Skin is warm and dry.     Capillary Refill: Capillary refill takes less than 2 seconds.  Neurological:     Mental Status: He is alert and oriented to person, place, and time.  Psychiatric:        Behavior: Behavior normal.      Musculoskeletal Exam: He had some limitation with lateral rotation without discomfort.  Shoulder joints, elbow joints, wrist joints were in good range of motion.  He had limited extension of his bilateral hands and loss of thenar eminence bilaterally.  No numbness was noted in his hands.  He had Dupuytren's contractures in his bilateral hands.  Hip joints and knee joints were in good range of motion.  There was no tenderness over ankles or MTPs.  CDAI Exam: CDAI Score: -- Patient Global: --; Provider Global: -- Swollen: --; Tender: -- Joint Exam 08/06/2023   No joint exam has been documented  for this visit   There is currently no information documented on the homunculus. Go to the Rheumatology activity and complete the homunculus joint exam.  Investigation: No additional findings.  Imaging: No results found.  Recent Labs: Lab Results  Component Value Date   WBC 6.4 01/29/2023   HGB 12.9 (L) 01/29/2023   PLT 245 01/29/2023   NA 139 06/24/2023   K 4.3 06/24/2023   CL 106 06/24/2023   CO2 26 06/24/2023   GLUCOSE 126 (H) 06/24/2023   BUN 22 06/24/2023   CREATININE 1.48 06/24/2023   BILITOT 0.4 06/24/2023   ALKPHOS 57 06/24/2023   AST 18 06/24/2023   ALT 13 06/24/2023   PROT 7.4 06/24/2023   ALBUMIN 4.0 06/24/2023   CALCIUM 9.8 06/24/2023   GFRAA 45 (L) 04/30/2021    Speciality Comments: Allopurinol-nausea  Procedures:  No procedures performed Allergies: Other and Shellfish allergy   Assessment / Plan:  Visit Diagnoses: Idiopathic chronic gout of multiple sites without tophus -she denies having a gout flare.  He has been taking allopurinol 450 mg once daily and colchicine 0.6mg  po as needed.  Hyperuricemia - uric acid: 6.6 on 01/29/2023.  Medication monitoring encounter-I will check CBC, BMP and uric acid today.  He had recent CMP.  Chronic renal impairment, stage 3a (HCC)-his creatinine remains elevated.  Most recent creatinine on June 24, 2023 was normal.  Primary osteoarthritis of both shoulders-he had good range of motion without discomfort.  Primary osteoarthritis of both hands-he had bilateral PIP and DIP thickening.  He had limited extension of bilateral hands due to Dupuytren's contracture.  Dupuytren's contracture of both hands- right first, third, fourth, fifth fingers and left first and middle finger Dupuytren's contractures noted. He had limited extension of his fingers.  He has difficulty gripping objects and doing activities.  He is planning to get some assistance at home as he lives by himself.  I will also refer him to physical therapy  and Occupational Therapy.  Primary osteoarthritis of both knees -he denies any discomfort today.  He has discomfort off and on in his knee joints.  Lower extremity muscle strengthening exercises were discussed.  X-rays in 2022 showed bilateral moderate osteoarthritis and severe chondromalacia patella.  Primary osteoarthritis of both feet -he had no tenderness over ankles or MTPs.  X-ray showed erosive changes and osteoarthritis.  DDD (degenerative disc disease), cervical-he had limited lateral rotation.  Positive ANA (antinuclear antibody) - Double-stranded DNA negative, Smith negative, anti-CCP negative.  He denies history of oral ulcers, nasal ulcers, malar rash, photosensitivity, Raynaud's or lymphadenopathy.  He has no clinical features of systemic lupus.  Other medical problems are listed as follows:  Prurigo nodularis  Uncontrolled type 2 diabetes mellitus with hyperglycemia (HCC)  Nuclear sclerotic cataract of both eyes  Severe nonproliferative diabetic retinopathy of both eyes with macular edema associated with type 2 diabetes mellitus (HCC)  Regular alcohol consumption  Orders: Orders Placed This Encounter  Procedures   CBC with Differential/Platelet   Uric acid   BASIC METABOLIC PANEL WITH GFR   No orders of the defined types were placed in this encounter.    Follow-Up Instructions: Return in about 6 months (around 02/03/2024) for Gout, Osteoarthritis.   Pollyann Savoy, MD  Note - This record has been created using Animal nutritionist.  Chart creation errors have been sought, but may not always  have been located. Such creation errors do not reflect on  the standard of medical care.

## 2023-07-31 DIAGNOSIS — L209 Atopic dermatitis, unspecified: Secondary | ICD-10-CM | POA: Diagnosis not present

## 2023-08-05 ENCOUNTER — Telehealth: Payer: Self-pay | Admitting: Radiology

## 2023-08-05 NOTE — Telephone Encounter (Signed)
Contacted Steven Knapp to schedule their annual wellness visit. Patient stated he will call back to schedule AWV visit   Aundra Millet CMA

## 2023-08-06 ENCOUNTER — Ambulatory Visit: Payer: Medicare HMO | Attending: Rheumatology | Admitting: Rheumatology

## 2023-08-06 ENCOUNTER — Encounter: Payer: Self-pay | Admitting: Rheumatology

## 2023-08-06 VITALS — BP 119/69 | HR 60 | Resp 16 | Ht 74.5 in | Wt 211.4 lb

## 2023-08-06 DIAGNOSIS — N1831 Chronic kidney disease, stage 3a: Secondary | ICD-10-CM

## 2023-08-06 DIAGNOSIS — M19011 Primary osteoarthritis, right shoulder: Secondary | ICD-10-CM | POA: Diagnosis not present

## 2023-08-06 DIAGNOSIS — Z5181 Encounter for therapeutic drug level monitoring: Secondary | ICD-10-CM

## 2023-08-06 DIAGNOSIS — M19041 Primary osteoarthritis, right hand: Secondary | ICD-10-CM

## 2023-08-06 DIAGNOSIS — M19012 Primary osteoarthritis, left shoulder: Secondary | ICD-10-CM

## 2023-08-06 DIAGNOSIS — M19071 Primary osteoarthritis, right ankle and foot: Secondary | ICD-10-CM | POA: Diagnosis not present

## 2023-08-06 DIAGNOSIS — E790 Hyperuricemia without signs of inflammatory arthritis and tophaceous disease: Secondary | ICD-10-CM | POA: Diagnosis not present

## 2023-08-06 DIAGNOSIS — M170 Bilateral primary osteoarthritis of knee: Secondary | ICD-10-CM | POA: Diagnosis not present

## 2023-08-06 DIAGNOSIS — M720 Palmar fascial fibromatosis [Dupuytren]: Secondary | ICD-10-CM

## 2023-08-06 DIAGNOSIS — Z789 Other specified health status: Secondary | ICD-10-CM

## 2023-08-06 DIAGNOSIS — E113413 Type 2 diabetes mellitus with severe nonproliferative diabetic retinopathy with macular edema, bilateral: Secondary | ICD-10-CM

## 2023-08-06 DIAGNOSIS — M5030 Other cervical disc degeneration, unspecified cervical region: Secondary | ICD-10-CM | POA: Diagnosis not present

## 2023-08-06 DIAGNOSIS — R768 Other specified abnormal immunological findings in serum: Secondary | ICD-10-CM

## 2023-08-06 DIAGNOSIS — M19072 Primary osteoarthritis, left ankle and foot: Secondary | ICD-10-CM

## 2023-08-06 DIAGNOSIS — M19042 Primary osteoarthritis, left hand: Secondary | ICD-10-CM

## 2023-08-06 DIAGNOSIS — M1A09X0 Idiopathic chronic gout, multiple sites, without tophus (tophi): Secondary | ICD-10-CM | POA: Diagnosis not present

## 2023-08-06 DIAGNOSIS — H2513 Age-related nuclear cataract, bilateral: Secondary | ICD-10-CM

## 2023-08-06 DIAGNOSIS — L281 Prurigo nodularis: Secondary | ICD-10-CM

## 2023-08-06 DIAGNOSIS — E1165 Type 2 diabetes mellitus with hyperglycemia: Secondary | ICD-10-CM

## 2023-08-06 NOTE — Addendum Note (Signed)
Addended by: Ellen Henri on: 08/06/2023 12:38 PM   Modules accepted: Orders

## 2023-08-07 ENCOUNTER — Other Ambulatory Visit: Payer: Self-pay | Admitting: *Deleted

## 2023-08-07 DIAGNOSIS — E790 Hyperuricemia without signs of inflammatory arthritis and tophaceous disease: Secondary | ICD-10-CM

## 2023-08-07 DIAGNOSIS — N1831 Chronic kidney disease, stage 3a: Secondary | ICD-10-CM

## 2023-08-07 DIAGNOSIS — M1A09X0 Idiopathic chronic gout, multiple sites, without tophus (tophi): Secondary | ICD-10-CM

## 2023-08-07 DIAGNOSIS — R768 Other specified abnormal immunological findings in serum: Secondary | ICD-10-CM

## 2023-08-07 DIAGNOSIS — Z5181 Encounter for therapeutic drug level monitoring: Secondary | ICD-10-CM

## 2023-08-07 LAB — CBC WITH DIFFERENTIAL/PLATELET
Absolute Monocytes: 602 cells/uL (ref 200–950)
Basophils Absolute: 58 cells/uL (ref 0–200)
Basophils Relative: 0.9 %
Eosinophils Absolute: 90 cells/uL (ref 15–500)
Eosinophils Relative: 1.4 %
HCT: 40.1 % (ref 38.5–50.0)
Hemoglobin: 12.9 g/dL — ABNORMAL LOW (ref 13.2–17.1)
Lymphs Abs: 1696 cells/uL (ref 850–3900)
MCH: 28.8 pg (ref 27.0–33.0)
MCHC: 32.2 g/dL (ref 32.0–36.0)
MCV: 89.5 fL (ref 80.0–100.0)
MPV: 11.6 fL (ref 7.5–12.5)
Monocytes Relative: 9.4 %
Neutro Abs: 3955 cells/uL (ref 1500–7800)
Neutrophils Relative %: 61.8 %
Platelets: 284 10*3/uL (ref 140–400)
RBC: 4.48 10*6/uL (ref 4.20–5.80)
RDW: 14.1 % (ref 11.0–15.0)
Total Lymphocyte: 26.5 %
WBC: 6.4 10*3/uL (ref 3.8–10.8)

## 2023-08-07 LAB — BASIC METABOLIC PANEL WITH GFR
BUN/Creatinine Ratio: 13 (calc) (ref 6–22)
BUN: 19 mg/dL (ref 7–25)
CO2: 26 mmol/L (ref 20–32)
Calcium: 9.8 mg/dL (ref 8.6–10.3)
Chloride: 104 mmol/L (ref 98–110)
Creat: 1.52 mg/dL — ABNORMAL HIGH (ref 0.70–1.28)
Glucose, Bld: 183 mg/dL — ABNORMAL HIGH (ref 65–99)
Potassium: 4.4 mmol/L (ref 3.5–5.3)
Sodium: 138 mmol/L (ref 135–146)
eGFR: 47 mL/min/{1.73_m2} — ABNORMAL LOW (ref >=60)

## 2023-08-07 LAB — URIC ACID: Uric Acid, Serum: 9.3 mg/dL — ABNORMAL HIGH (ref 4.0–8.0)

## 2023-08-07 NOTE — Progress Notes (Signed)
Hemoglobin is low and stable.  Uric acid is up at 9.3.  Glucose is elevated at 183 and creatinine is elevated at 1.52.  Patient is most likely not taking allopurinol.  He should take allopurinol 300 mg, 1-1/2 tablet daily.  Repeat BMP and uric acid in 2 months after taking allopurinol on a regular basis.

## 2023-08-12 NOTE — Therapy (Signed)
OUTPATIENT OCCUPATIONAL THERAPY ORTHO EVALUATION  Patient Name: Steven Knapp MRN: [id number] DOB:August 28, 1946, 77 y.o., male Today's Date: 08/13/2023  PCP: Georgina Quint, MD REFERRING PROVIDER:  Pollyann Savoy, MD    END OF SESSION:  OT End of Session - 08/13/23 1021     Visit Number 1    Number of Visits 6    Date for OT Re-Evaluation 09/25/23    Authorization Type [employer]    OT Start Time 1021    OT Stop Time 1108    OT Time Calculation (min) 47 min    Activity Tolerance Patient tolerated treatment well;No increased pain    Behavior During Therapy [facility] for tasks assessed/performed             Past Medical History:  Diagnosis Date   Arthritis    Cataract    Diabetes mellitus without complication (HCC)    Spinal stenosis    per patient   Past Surgical History:  Procedure Laterality Date   CATARACT EXTRACTION, BILATERAL     04/2023, 05/2023   Patient Active Problem List   Diagnosis Date Noted   Dyspepsia 06/24/2023   Primary osteoarthritis of both shoulders 05/24/2021   Primary osteoarthritis of both hands 05/24/2021   Primary osteoarthritis of both feet 05/24/2021   DDD (degenerative disc disease), cervical 05/24/2021   Prurigo nodularis 05/24/2021   Chronic renal impairment, stage 3a (HCC) 05/24/2021   Uncontrolled type 2 diabetes mellitus with hyperglycemia (HCC) 07/30/2020   Severe nonproliferative diabetic retinopathy of left eye, with macular edema, associated with type 2 diabetes mellitus (HCC) 02/21/2020   Severe nonproliferative diabetic retinopathy of right eye, with macular edema, associated with type 2 diabetes mellitus (HCC) 02/21/2020   Nuclear sclerotic cataract of both eyes 02/21/2020   Retinal hemorrhage of left eye 02/21/2020   Retinal exudates and deposits 02/21/2020   Alternating exotropia 02/21/2020   Diabetes mellitus (HCC) 10/06/2019   Spinal stenosis 10/06/2019   History of gout 10/06/2019   History of rheumatoid  arthritis 10/06/2019   Osteoarthritis of wrist 11/05/2018   Gout 09/16/2018    ONSET DATE: Chronic   REFERRING DIAG: M19.041,M19.042 (ICD-10-CM) - Primary osteoarthritis of both hands  THERAPY DIAG:  Stiffness of right hand, not elsewhere classified  Stiffness of left hand, not elsewhere classified  Rationale for Evaluation and Treatment: Rehabilitation  SUBJECTIVE:   SUBJECTIVE STATEMENT: 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 He's had increasing stiffness in bil hands, difficulty grabbing and using household objects.  Fortunately, he's got no significant pains.    PERTINENT HISTORY: Per referral: "OT for osteoarthritis bilateral hands. "   PRECAUTIONS: None  RED FLAGS: None   WEIGHT BEARING RESTRICTIONS: No  PAIN:  Are you having pain? No pain now or in past week  FALLS: Has patient fallen in last 6 months? No  LIVING ENVIRONMENT: Lives with: lives alone Lives in: House/apartment Has following equipment at home: None  PLOF: Independent  PATIENT GOALS: To reduce stiffness and increase function with both hands   OBJECTIVE: (All objective assessments below are from initial evaluation on: 08/13/23 unless otherwise specified.)   HAND DOMINANCE: Right   ADLs: Overall ADLs: States decreased ability to grab, hold household objects, pain and difficulty to open containers, perform FMS tasks (manipulate fasteners on clothing), mild to moderate bathing problems as well.    FUNCTIONAL OUTCOME MEASURES: Eval: Quick DASH 50% impairment today  (Higher % Score  =  More Impairment)  UPPER EXTREMITY ROM     Shoulder to Wrist AROM Right eval Left eval  Wrist flexion 52 72  Wrist extension 63 45  (Blank rows = not tested)  Eval: OT picked that stiffness fingers of each hand to use a specific measurable examples today.  Hand AROM Right eval Left eval  Full Fist Ability (or Gap to Distal Palmar Crease) unable unable  Thumb  Opposition  (Kapandji Scale)  [facility] WFL  Index MCP (0-90) 0 -81    Index PIP (0-100) (-23) - 67    Index DIP (0-70) 0-  25    Long MCP (0-90)   0-  74  Long PIP (0-100)   0-  50  Long DIP (0-70)   (-9)-  44  (Blank rows = not tested)   HAND FUNCTION: Eval: Mild observed weakness in both affected hands.  Grip strength Right: 46 lbs, Left: 38 lbs   COORDINATION: Eval: Observed coordination impairments with affected BOTH hands. 9 Hole Peg Test Right: 32.6sec, Left: 34.8 sec (30 sec is WFL)   SENSATION: Eval:  Light touch intact today, though he does have peripheral neuropathy  EDEMA:   Eval: Typical swelling of MCP joints in bilateral hands, typical of arthritis  COGNITION: Eval: Overall cognitive status: WFL for evaluation today   OBSERVATIONS:   Eval: most of his stiffness seems to be in bil PIP Js.  MCP Js moving well with composite fist, and DIP Js are slightly stiff.  He did not feel pain or much of a stretch with therapist passive stretches today, and a mobilization orthosis may be indicated to help regain stubborn motion.  He also has 2 small masses under the sin of Lt thumb volarly, that appear to be cystic in nature. They do not bother him.   Bunnell test for intrinsic tightness shows no intrinsic or extrinsic tightness, but instead, mainly capsular tightness.   No significant thumb complaints.      TODAY'S TREATMENT:  Post-evaluation treatment:   He was given the following education on a home exercise program to help work on his stiffness and rigidity in his hands.  He was told to use a moist heat or a warm soak for at least 5 minutes and then perform the stretches which were demonstrated to him, explained to him, and he demonstrated back.  He was told to do these carefully and slowly 3-4 times a day as best he could stretching for 15 to 20 seconds and doing at least 3 stretches per exercise.  He seems very receptive and understanding today.  These do not cause any  discomfort or pain and he states that he can do these at home himself.  We decided to give him 2 weeks to perform this as this tightness is very chronic and capsular tightness can take a long time to change.   Exercises - Seated Wrist Flexion Stretch  - 3 x daily - 3 reps - 15 hold - Wrist Prayer Stretch  - 4 x daily - 3-5 reps - 15 sec hold - HOOK Stretch  - 4 x daily - 3-5 reps - 15-20 sec hold - Seated Finger Composite Flexion Stretch  - 4 x daily - 3-5 reps - 15 hold - PUSH KNUCKLES DOWN  - 4 x daily - 3-5 reps - 15 seconds hold - Tendon Glides  - 4-6 x daily - 3-5 reps - 2-3 seconds hold - Finger Spreading  - 4-6 x daily - 10-15 reps  PATIENT EDUCATION: Education details: See tx section above for details  Person educated: Patient Education method: Verbal Instruction, Teach back, Handouts  Education comprehension: States and demonstrates understanding, Additional Education required    HOME EXERCISE PROGRAM: Access Code: [email or web address] URL: [email or web address] Date: 08/13/2023 Prepared by: Fannie Knee   GOALS: Goals reviewed with patient? Yes   SHORT TERM GOALS: (STG required if POC>30 days) Target Date: 08/13/23  Pt will obtain protective, custom orthotic. Goal status: TBD/PRN  2.  Pt will demo/state understanding of initial HEP to improve pain levels and prerequisite motion. Goal status: 08/13/23: MET   LONG TERM GOALS: Target Date: 09/25/23  Pt will improve functional ability by decreased impairment per Quick DASH assessment from 50% to 25% or better, for better quality of life. Goal status: INITIAL  2.  Pt will improve grip strength in bil hands by at least 10lbs each for functional use at home and in IADLs. Goal status: INITIAL  3.  Pt will improve A/ROM in Rt IF TAM from 150* to at least 200*, to have functional motion for tasks like reach and grasp.  Goal status: INITIAL  4. Pt will improve coordination skills in bil hands, as seen by [facility]  score on 9HPT testing to have increased functional ability to carry out fine motor tasks (fasteners, etc.) and more complex, coordinated IADLs (meal prep, sports, etc.).  Goal status: INITIAL   ASSESSMENT:  CLINICAL IMPRESSION: Patient is a 77 y.o. male who was seen today for occupational therapy evaluation for stiff bilateral hands caused by chronic conditions like osteoarthritis.  He will benefit from treatment and recommendations from occupational therapy to decrease this stiffness and increase quality of life.  Will start conservatively with the home exercise program stretch routine, but if this is unaffected he will likely need some kind of static progressive orthosis to decrease capsular tightness in his PIP joints.   PERFORMANCE DEFICITS: in functional skills including ADLs, IADLs, coordination, dexterity, ROM, strength, fascial restrictions, flexibility, Fine motor control, body mechanics, endurance, and UE functional use, cognitive skills including problem solving, and psychosocial skills including coping strategies, environmental adaptation, and habits.   IMPAIRMENTS: are limiting patient from ADLs, IADLs, and leisure.   COMORBIDITIES: may have co-morbidities  that affects occupational performance. Patient will benefit from skilled OT to address above impairments and improve overall function.  MODIFICATION OR ASSISTANCE TO COMPLETE EVALUATION: No modification of tasks or assist necessary to complete an evaluation.  OT OCCUPATIONAL PROFILE AND HISTORY: Problem focused assessment: Including review of records relating to presenting problem.  CLINICAL DECISION MAKING: LOW - limited treatment options, no task modification necessary  REHAB POTENTIAL: Good  EVALUATION COMPLEXITY: Low      PLAN:  OT FREQUENCY: every other week  OT DURATION: 6 weeks up to 6 visits through 09/25/23 as needed   PLANNED INTERVENTIONS: self care/ADL training, therapeutic exercise, therapeutic activity,  manual therapy, passive range of motion, splinting, fluidotherapy, compression bandaging, moist heat, cryotherapy, contrast bath, patient/family education, coping strategies training, Re-evaluation, and Dry needling  RECOMMENDED OTHER SERVICES: none now  CONSULTED AND AGREED WITH PLAN OF CARE: Patient  PLAN FOR NEXT SESSION:  In 2 weeks to check his motion at the wrist and hands to determine progress and consider making a custom fabricated static progressive orthosis or dynamic orthosis (use weeks test to help determine)   Fannie Knee, OTR/L, CHT 08/13/2023, 12:52 PM     Referring diagnosis? M19.041,M19.042 (ICD-10-CM) - Primary osteoarthritis of both hands Treatment diagnosis? (if different than referring  diagnosis) M25.642, M25.641 What was this (referring dx) caused by? []  Surgery []  Fall []  Ongoing issue [x]  Arthritis []  Other: ____________  Laterality: []  Rt []  Lt [x]  Both  Check all possible CPT codes:  *CHOOSE 10 OR LESS*    [x]  29937 (Therapeutic Exercise)  []  92507 (SLP Treatment)  []  97112 (Neuro Re-ed)   []  92526 (Swallowing Treatment)   []  16967 (Gait Training)   []  [id number] (Cognitive Training, 1st 15 minutes) [x]  97140 (Manual Therapy)   []  97130 (Cognitive Training, each add'l 15 minutes)  []  97164 (Re-evaluation)                              []  Other, List CPT Code ____________  [x]  97530 (Therapeutic Activities)     [x]  97535 (Self Care)   []  All codes above (97110 - 97535)  []  97012 (Mechanical Traction)  []  97014 (E-stim Unattended)  []  97032 (E-stim manual)  []  97033 (Ionto)  []  97035 (Ultrasound) []  97750 (Physical Performance Training) []  [id number] (Aquatic Therapy) []  97016 (Vasopneumatic Device) []  [id number] (Paraffin) []  97034 (Contrast Bath) []  97597 (Wound Care 1st 20 sq cm) []  97598 (Wound Care each add'l 20 sq cm) [x]  97760 (Orthotic Fabrication, Fitting, Training Initial) []  [id number] (Prosthetic Management and Training Initial) [x]  [id number]  (Orthotic or Prosthetic Training/ Modification Subsequent)

## 2023-08-13 ENCOUNTER — Encounter: Payer: Self-pay | Admitting: Rehabilitative and Restorative Service Providers"

## 2023-08-13 ENCOUNTER — Ambulatory Visit: Payer: Medicare HMO | Admitting: Rehabilitative and Restorative Service Providers"

## 2023-08-13 ENCOUNTER — Other Ambulatory Visit: Payer: Self-pay

## 2023-08-13 DIAGNOSIS — M25642 Stiffness of left hand, not elsewhere classified: Secondary | ICD-10-CM

## 2023-08-13 DIAGNOSIS — H359 Unspecified retinal disorder: Secondary | ICD-10-CM | POA: Diagnosis not present

## 2023-08-13 DIAGNOSIS — M25641 Stiffness of right hand, not elsewhere classified: Secondary | ICD-10-CM

## 2023-08-13 DIAGNOSIS — H3562 Retinal hemorrhage, left eye: Secondary | ICD-10-CM | POA: Diagnosis not present

## 2023-08-13 DIAGNOSIS — Z961 Presence of intraocular lens: Secondary | ICD-10-CM | POA: Diagnosis not present

## 2023-08-13 DIAGNOSIS — E113412 Type 2 diabetes mellitus with severe nonproliferative diabetic retinopathy with macular edema, left eye: Secondary | ICD-10-CM | POA: Diagnosis not present

## 2023-08-13 DIAGNOSIS — H35372 Puckering of macula, left eye: Secondary | ICD-10-CM | POA: Diagnosis not present

## 2023-08-13 LAB — HM DIABETES EYE EXAM

## 2023-08-14 DIAGNOSIS — L209 Atopic dermatitis, unspecified: Secondary | ICD-10-CM | POA: Diagnosis not present

## 2023-08-24 ENCOUNTER — Telehealth: Payer: Self-pay | Admitting: Rheumatology

## 2023-08-24 NOTE — Telephone Encounter (Signed)
Reached out to patient to see if he would provide reason for his call. Patient declined to provide reason and states "I need to talk to her privately." Patient advised Dr. Corliss Skains is out of the office today and I would forward the message for her to return his call tomorrow when time permits. Patient expressed understanding.

## 2023-08-24 NOTE — Telephone Encounter (Signed)
Pt called asking to talk to Dr. Corliss Skains. Asked pt what it was regarding to and pt replied "it is a conversation we had" and to call him as soon as she can.

## 2023-08-25 NOTE — Telephone Encounter (Signed)
Returned patient's call.  Patient wanted his friend Marlis Edelson to get an earlier appointment if possible.  I told patient that we will put her on a cancellation list.

## 2023-08-27 ENCOUNTER — Ambulatory Visit (INDEPENDENT_AMBULATORY_CARE_PROVIDER_SITE_OTHER): Payer: Medicare HMO | Admitting: Rehabilitative and Restorative Service Providers"

## 2023-08-27 ENCOUNTER — Encounter: Payer: Self-pay | Admitting: Rehabilitative and Restorative Service Providers"

## 2023-08-27 DIAGNOSIS — M25641 Stiffness of right hand, not elsewhere classified: Secondary | ICD-10-CM

## 2023-08-27 DIAGNOSIS — M25642 Stiffness of left hand, not elsewhere classified: Secondary | ICD-10-CM

## 2023-08-27 NOTE — Therapy (Signed)
OUTPATIENT OCCUPATIONAL THERAPY TREATMENT & DISCHARGE NOTE   Patient Name: Steven Knapp MRN: [id number] DOB:04/17/1946, 77 y.o., male Today's Date: 08/27/2023  PCP: Georgina Quint, MD REFERRING PROVIDER:  Pollyann Savoy, MD       OCCUPATIONAL THERAPY DISCHARGE SUMMARY  Visits from Start of Care: 2  08/27/23: Over 2 weeks of self-management and performing stretches, he has made significant progress in total active motion of both hands and both wrists.  He is also significantly stronger in both hands after these 2 weeks.  Additionally he was shown a new dynamic stretching technique with compressive bandages today that he can attempt overnight as tolerated.  He states that he is happy with his progress and that he can continue to manage these things on his own now.  He should not need any more follow-ups from OT, but could continue on in the future with a new order if needed or desired.  Fannie Knee, OTR/L, CHT 08/27/23       END OF SESSION:  OT End of Session - 08/27/23 1017     Visit Number 2    Number of Visits 6    Date for OT Re-Evaluation 09/25/23    Authorization Type [employer]    OT Start Time 1017    OT Stop Time 1041    OT Time Calculation (min) 24 min    Equipment Utilized During Treatment coban roll    Activity Tolerance Patient tolerated treatment well;No increased pain    Behavior During Therapy WFL for tasks assessed/performed              Past Medical History:  Diagnosis Date   Arthritis    Cataract    Diabetes mellitus without complication (HCC)    Spinal stenosis    per patient   Past Surgical History:  Procedure Laterality Date   CATARACT EXTRACTION, BILATERAL     04/2023, 05/2023   Patient Active Problem List   Diagnosis Date Noted   Dyspepsia 06/24/2023   Primary osteoarthritis of both shoulders 05/24/2021   Primary osteoarthritis of both hands 05/24/2021   Primary osteoarthritis of both feet 05/24/2021   DDD  (degenerative disc disease), cervical 05/24/2021   Prurigo nodularis 05/24/2021   Chronic renal impairment, stage 3a (HCC) 05/24/2021   Uncontrolled type 2 diabetes mellitus with hyperglycemia (HCC) 07/30/2020   Severe nonproliferative diabetic retinopathy of left eye, with macular edema, associated with type 2 diabetes mellitus (HCC) 02/21/2020   Severe nonproliferative diabetic retinopathy of right eye, with macular edema, associated with type 2 diabetes mellitus (HCC) 02/21/2020   Nuclear sclerotic cataract of both eyes 02/21/2020   Retinal hemorrhage of left eye 02/21/2020   Retinal exudates and deposits 02/21/2020   Alternating exotropia 02/21/2020   Diabetes mellitus (HCC) 10/06/2019   Spinal stenosis 10/06/2019   History of gout 10/06/2019   History of rheumatoid arthritis 10/06/2019   Osteoarthritis of wrist 11/05/2018   Gout 09/16/2018    ONSET DATE: Chronic   REFERRING DIAG: M19.041,M19.042 (ICD-10-CM) - Primary osteoarthritis of both hands  THERAPY DIAG:  Stiffness of left hand, not elsewhere classified  Stiffness of right hand, not elsewhere classified  Rationale for Evaluation and Treatment: Rehabilitation  PERTINENT HISTORY: Per referral: "OT for osteoarthritis bilateral hands. 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 He's had increasing stiffness in bil hands, difficulty grabbing and using household objects.  Fortunately, he's got no significant pains.  PRECAUTIONS: None  RED FLAGS: None  WEIGHT BEARING RESTRICTIONS: No    SUBJECTIVE:   SUBJECTIVE STATEMENT: He returns for a 2-week f/u after independently managing a home exercise program to decrease the stiffness in his hands. He states he is not sure if he has made any progress and he thinks he is just "getting old."  He still does not have any type of pain     PAIN:  Are you having pain? No pain now or in past week  PATIENT GOALS: To reduce stiffness and  increase function with both hands   OBJECTIVE: (All objective assessments below are from initial evaluation on: 08/13/23 unless otherwise specified.)   HAND DOMINANCE: Right   ADLs: Overall ADLs: States decreased ability to grab, hold household objects, pain and difficulty to open containers, perform FMS tasks (manipulate fasteners on clothing), mild to moderate bathing problems as well.    FUNCTIONAL OUTCOME MEASURES: Eval: Quick DASH 50% impairment today  (Higher % Score  =  More Impairment)     UPPER EXTREMITY ROM     Shoulder to Wrist AROM Right eval Left eval Rt   /  Lt 08/27/23  Wrist flexion 52 72 62  /  64  Wrist extension 63 45 60  /  62  (Blank rows = not tested)  Eval: OT picked that stiffness fingers of each hand to use a specific measurable examples today.  Hand AROM Right eval Left eval Rt  /  Lt  08/27/23  Full Fist Ability (or Gap to Distal Palmar Crease) unable unable Finger tips touch palm now, but still 2-3cm from [facility] bil   Thumb Opposition  (Kapandji Scale)  [facility]  Index MCP (0-90) 0 -81   0- 77  Rt  Index PIP (0-100) (-23) - 67   (-20) - 72  Rt  Index DIP (0-70) 0-  25   (-5) -  25  Rt  Long MCP (0-90)   0-  74 0  - 74 Lt  Long PIP (0-100)   0-  50 0  - 66 Lt  Long DIP (0-70)   (-9)-  44 (-14)  -  50 Lt  (Blank rows = not tested)   HAND FUNCTION: 08/27/23: Grip Rt: 58 Lt: 44.4  Eval: Mild observed weakness in both affected hands.  Grip strength Right: 46 lbs, Left: 38 lbs    OBSERVATIONS:   08/27/23: He does appear less stiff and rigid through both of his hands and wrists today, and his fingertips to touch his palm which was not possible 2 weeks ago.   Eval: most of his stiffness seems to be in bil PIP Js.  MCP Js moving well with composite fist, and DIP Js are slightly stiff.  He did not feel pain or much of a stretch with therapist passive stretches today, and a mobilization orthosis may be indicated to help regain stubborn  motion.  He also has 2 small masses under the sin of Lt thumb volarly, that appear to be cystic in nature. They do not bother him.   B[place]ell test for intrinsic tightness shows no intrinsic or extrinsic tightness, but instead, mainly capsular tightness.   No significant thumb complaints.    TODAY'S TREATMENT:  08/27/23: He starts with active range of motion for exercises as well as new measures today which shows 19*total active range of motion improvement in the right hand index finger and 17 degrees total active motion improvement in the left hand middle finger (these were her stiffest fingers in  either hand).  His wrists have also made total active motion improvement.  OT reviews his home exercises with him and shows him how to see them electronically on his phone.  OT also offers up a new technique for dynamic progressive stretching his hands into a fist overnight.  OT supplies him with a roll of Coban and and shows him how to do a gentle wrapping on a very light stretch that he can try overnight and was recommended to do 1 hand at a time, alternating nights.  He was cautioned that he would wake up with a possibly stiff and sore feeling hand but better ability to make a fist.  He should still warm up his hands in the morning and do his stretches the first thing, and then likely do once or twice more in the day.  OT is confident that since he has made progress in the past 2 weeks, if he continues this plan he will continue to make excellent progress in the coming months.  He states understanding all directions and leaves happily.  Exercises - Seated Wrist Flexion Stretch  - 3 x daily - 3 reps - 15 hold - Wrist Prayer Stretch  - 4 x daily - 3-5 reps - 15 sec hold - HOOK Stretch  - 4 x daily - 3-5 reps - 15-20 sec hold - Seated Finger Composite Flexion Stretch  - 4 x daily - 3-5 reps - 15 hold - PUSH KNUCKLES DOWN  - 4 x daily - 3-5 reps - 15 seconds hold - Tendon Glides  - 4-6 x daily - 3-5 reps  - 2-3 seconds hold - Finger Spreading  - 4-6 x daily - 10-15 reps    PATIENT EDUCATION: Education details: See tx section above for details  Person educated: Patient Education method: Verbal Instruction, Teach back, Handouts  Education comprehension: States and demonstrates understanding   HOME EXERCISE PROGRAM: Access Code: [email or web address] URL: [email or web address] Date: 08/13/2023 Prepared by: Fannie Knee   GOALS: Goals reviewed with patient? Yes   SHORT TERM GOALS: (STG required if POC>30 days) Target Date: 08/13/23  Pt will obtain protective, custom orthotic. Goal status: N/A  2.  Pt will demo/state understanding of initial HEP to improve pain levels and prerequisite motion. Goal status: 08/13/23: MET   LONG TERM GOALS: Target Date: 09/25/23  Pt will improve functional ability by decreased impairment per Quick DASH assessment from 50% to 25% or better, for better quality of life. Goal status: 08/27/23: NT today, but assumed better due to progress made.   2.  Pt will improve grip strength in bil hands by at least 10lbs each for functional use at home and in IADLs. Goal status: 08/27/23: MET  3.  Pt will improve A/ROM in Rt IF TAM from 150* to at least 200*, to have functional motion for tasks like reach and grasp.  Goal status: 08/27/23: Not met, but did increase by 19* TAM, and is expected to continue to increase over time.   4. Pt will improve coordination skills in bil hands, as seen by [facility] score on 9HPT testing to have increased functional ability to carry out fine motor tasks (fasteners, etc.) and more complex, coordinated IADLs (meal prep, sports, etc.).  Goal status: NT today, but he states less coordination issues now.    ASSESSMENT:  CLINICAL IMPRESSION: 08/27/23: Over 2 weeks of self-management and performing stretches, he has made significant progress in total active motion of both hands and both wrists.  He is also  significantly stronger in  both hands after these 2 weeks.  Additionally he was shown a new dynamic stretching technique with compressive bandages today that he can attempt overnight as tolerated.  He states that he is happy with his progress and that he can continue to manage these things on his own now.  He should not need any more follow-ups from OT, but could continue on in the future with a new order if needed or desired.    PLAN:  OT FREQUENCY: & OT DURATION: D/C now   PLANNED INTERVENTIONS: self care/ADL training, therapeutic exercise, therapeutic activity, manual therapy, passive range of motion, splinting, fluidotherapy, compression bandaging, moist heat, cryotherapy, contrast bath, patient/family education, coping strategies training, Re-evaluation, and Dry needling  RECOMMENDED OTHER SERVICES: none now  CONSULTED AND AGREED WITH PLAN OF CARE: Patient  PLAN FOR NEXT SESSION:  N/A    Fannie Knee, OTR/L, CHT 08/27/2023, 10:55 AM

## 2023-09-01 DIAGNOSIS — L910 Hypertrophic scar: Secondary | ICD-10-CM | POA: Diagnosis not present

## 2023-09-01 DIAGNOSIS — L281 Prurigo nodularis: Secondary | ICD-10-CM | POA: Diagnosis not present

## 2023-09-03 ENCOUNTER — Encounter: Payer: Self-pay | Admitting: Emergency Medicine

## 2023-09-03 DIAGNOSIS — H3562 Retinal hemorrhage, left eye: Secondary | ICD-10-CM | POA: Diagnosis not present

## 2023-09-03 DIAGNOSIS — Z961 Presence of intraocular lens: Secondary | ICD-10-CM | POA: Diagnosis not present

## 2023-09-03 DIAGNOSIS — H35372 Puckering of macula, left eye: Secondary | ICD-10-CM | POA: Diagnosis not present

## 2023-09-03 DIAGNOSIS — E113411 Type 2 diabetes mellitus with severe nonproliferative diabetic retinopathy with macular edema, right eye: Secondary | ICD-10-CM | POA: Diagnosis not present

## 2023-09-16 DIAGNOSIS — L209 Atopic dermatitis, unspecified: Secondary | ICD-10-CM | POA: Diagnosis not present

## 2023-09-17 DIAGNOSIS — H3562 Retinal hemorrhage, left eye: Secondary | ICD-10-CM | POA: Diagnosis not present

## 2023-09-17 DIAGNOSIS — H35372 Puckering of macula, left eye: Secondary | ICD-10-CM | POA: Diagnosis not present

## 2023-09-17 DIAGNOSIS — Z961 Presence of intraocular lens: Secondary | ICD-10-CM | POA: Diagnosis not present

## 2023-09-17 DIAGNOSIS — E113412 Type 2 diabetes mellitus with severe nonproliferative diabetic retinopathy with macular edema, left eye: Secondary | ICD-10-CM | POA: Diagnosis not present

## 2023-09-29 DIAGNOSIS — L209 Atopic dermatitis, unspecified: Secondary | ICD-10-CM | POA: Diagnosis not present

## 2023-10-07 ENCOUNTER — Other Ambulatory Visit: Payer: Self-pay | Admitting: Rheumatology

## 2023-10-07 ENCOUNTER — Other Ambulatory Visit: Payer: Self-pay | Admitting: Physician Assistant

## 2023-10-07 DIAGNOSIS — M1A09X0 Idiopathic chronic gout, multiple sites, without tophus (tophi): Secondary | ICD-10-CM

## 2023-10-07 NOTE — Telephone Encounter (Signed)
Last Fill: 09/17/2022  Labs: 08/06/2023 Hemoglobin is low and stable.  Uric acid is up at 9.3.  Glucose is elevated at 183 and creatinine is elevated at 1.52.   Next Visit: 02/05/2024  Last Visit: 08/06/2023  DX:  Idiopathic chronic gout of multiple sites without tophus   Current Dose per office note 08/06/2023: allopurinol 450 mg once daily   Okay to refill Allopurinol?

## 2023-10-07 NOTE — Telephone Encounter (Signed)
Last Fill: 04/18/2022   Labs: 08/06/2023 Hemoglobin is low and stable.  Uric acid is up at 9.3.  Glucose is elevated at 183 and creatinine is elevated at 1.52.    Next Visit: 02/05/2024   Last Visit: 08/06/2023   DX:  Idiopathic chronic gout of multiple sites without tophus    Current Dose per office note 08/06/2023: colchicine 0.6mg  po as needed    Okay to refill Colchicine?

## 2023-10-08 DIAGNOSIS — E113411 Type 2 diabetes mellitus with severe nonproliferative diabetic retinopathy with macular edema, right eye: Secondary | ICD-10-CM | POA: Diagnosis not present

## 2023-10-08 DIAGNOSIS — H35372 Puckering of macula, left eye: Secondary | ICD-10-CM | POA: Diagnosis not present

## 2023-10-08 DIAGNOSIS — H3562 Retinal hemorrhage, left eye: Secondary | ICD-10-CM | POA: Diagnosis not present

## 2023-10-08 DIAGNOSIS — H359 Unspecified retinal disorder: Secondary | ICD-10-CM | POA: Diagnosis not present

## 2023-10-21 DIAGNOSIS — L281 Prurigo nodularis: Secondary | ICD-10-CM | POA: Diagnosis not present

## 2023-10-22 DIAGNOSIS — H3562 Retinal hemorrhage, left eye: Secondary | ICD-10-CM | POA: Diagnosis not present

## 2023-10-22 DIAGNOSIS — H35372 Puckering of macula, left eye: Secondary | ICD-10-CM | POA: Diagnosis not present

## 2023-10-22 DIAGNOSIS — E113412 Type 2 diabetes mellitus with severe nonproliferative diabetic retinopathy with macular edema, left eye: Secondary | ICD-10-CM | POA: Diagnosis not present

## 2023-10-22 DIAGNOSIS — H359 Unspecified retinal disorder: Secondary | ICD-10-CM | POA: Diagnosis not present

## 2023-10-23 DIAGNOSIS — L281 Prurigo nodularis: Secondary | ICD-10-CM | POA: Diagnosis not present

## 2023-11-06 DIAGNOSIS — L281 Prurigo nodularis: Secondary | ICD-10-CM | POA: Diagnosis not present

## 2023-11-10 ENCOUNTER — Other Ambulatory Visit: Payer: Self-pay | Admitting: Emergency Medicine

## 2023-11-10 DIAGNOSIS — E1169 Type 2 diabetes mellitus with other specified complication: Secondary | ICD-10-CM

## 2023-11-20 DIAGNOSIS — L281 Prurigo nodularis: Secondary | ICD-10-CM | POA: Diagnosis not present

## 2023-11-27 DIAGNOSIS — L281 Prurigo nodularis: Secondary | ICD-10-CM | POA: Diagnosis not present

## 2023-11-27 DIAGNOSIS — D170 Benign lipomatous neoplasm of skin and subcutaneous tissue of head, face and neck: Secondary | ICD-10-CM | POA: Diagnosis not present

## 2023-11-30 DIAGNOSIS — H35372 Puckering of macula, left eye: Secondary | ICD-10-CM | POA: Diagnosis not present

## 2023-11-30 DIAGNOSIS — E113411 Type 2 diabetes mellitus with severe nonproliferative diabetic retinopathy with macular edema, right eye: Secondary | ICD-10-CM | POA: Diagnosis not present

## 2023-11-30 DIAGNOSIS — H3562 Retinal hemorrhage, left eye: Secondary | ICD-10-CM | POA: Diagnosis not present

## 2023-12-04 DIAGNOSIS — L281 Prurigo nodularis: Secondary | ICD-10-CM | POA: Diagnosis not present

## 2023-12-07 DIAGNOSIS — H3562 Retinal hemorrhage, left eye: Secondary | ICD-10-CM | POA: Diagnosis not present

## 2023-12-07 DIAGNOSIS — H35372 Puckering of macula, left eye: Secondary | ICD-10-CM | POA: Diagnosis not present

## 2023-12-07 DIAGNOSIS — H359 Unspecified retinal disorder: Secondary | ICD-10-CM | POA: Diagnosis not present

## 2023-12-07 DIAGNOSIS — E113412 Type 2 diabetes mellitus with severe nonproliferative diabetic retinopathy with macular edema, left eye: Secondary | ICD-10-CM | POA: Diagnosis not present

## 2023-12-09 DIAGNOSIS — N1831 Chronic kidney disease, stage 3a: Secondary | ICD-10-CM | POA: Diagnosis not present

## 2023-12-18 DIAGNOSIS — L281 Prurigo nodularis: Secondary | ICD-10-CM | POA: Diagnosis not present

## 2023-12-23 DIAGNOSIS — E559 Vitamin D deficiency, unspecified: Secondary | ICD-10-CM | POA: Diagnosis not present

## 2023-12-23 DIAGNOSIS — I10 Essential (primary) hypertension: Secondary | ICD-10-CM | POA: Diagnosis not present

## 2023-12-23 DIAGNOSIS — E1122 Type 2 diabetes mellitus with diabetic chronic kidney disease: Secondary | ICD-10-CM | POA: Diagnosis not present

## 2023-12-23 DIAGNOSIS — M069 Rheumatoid arthritis, unspecified: Secondary | ICD-10-CM | POA: Diagnosis not present

## 2023-12-23 DIAGNOSIS — N1831 Chronic kidney disease, stage 3a: Secondary | ICD-10-CM | POA: Diagnosis not present

## 2023-12-23 DIAGNOSIS — D631 Anemia in chronic kidney disease: Secondary | ICD-10-CM | POA: Diagnosis not present

## 2023-12-28 ENCOUNTER — Encounter: Payer: Self-pay | Admitting: Emergency Medicine

## 2023-12-28 ENCOUNTER — Ambulatory Visit (INDEPENDENT_AMBULATORY_CARE_PROVIDER_SITE_OTHER): Payer: Medicare HMO | Admitting: Emergency Medicine

## 2023-12-28 VITALS — BP 136/80 | HR 52 | Ht 72.2 in | Wt 222.0 lb

## 2023-12-28 DIAGNOSIS — E113412 Type 2 diabetes mellitus with severe nonproliferative diabetic retinopathy with macular edema, left eye: Secondary | ICD-10-CM | POA: Diagnosis not present

## 2023-12-28 DIAGNOSIS — E1169 Type 2 diabetes mellitus with other specified complication: Secondary | ICD-10-CM | POA: Diagnosis not present

## 2023-12-28 DIAGNOSIS — N1831 Chronic kidney disease, stage 3a: Secondary | ICD-10-CM

## 2023-12-28 DIAGNOSIS — N2889 Other specified disorders of kidney and ureter: Secondary | ICD-10-CM

## 2023-12-28 DIAGNOSIS — L723 Sebaceous cyst: Secondary | ICD-10-CM | POA: Diagnosis not present

## 2023-12-28 DIAGNOSIS — Z8739 Personal history of other diseases of the musculoskeletal system and connective tissue: Secondary | ICD-10-CM | POA: Diagnosis not present

## 2023-12-28 NOTE — Assessment & Plan Note (Signed)
Stable with no recent flareups. Continue allopurinol 300 mg daily

## 2023-12-28 NOTE — Assessment & Plan Note (Signed)
Advised to stay well-hydrated and avoid NSAIDs Chronic stable condition

## 2023-12-28 NOTE — Assessment & Plan Note (Signed)
Stable.  Follows up with ophthalmologist on a regular basis.

## 2023-12-28 NOTE — Assessment & Plan Note (Signed)
 Of scalp.  Chronic without complications.  No concerns.

## 2023-12-28 NOTE — Progress Notes (Signed)
 Steven Knapp 78 y.o.   Chief Complaint  Patient presents with   Follow-up    6 month f/u. Patient states he has a bump on the side of left ear that has been there for years and wants someone to look at it     HISTORY OF PRESENT ILLNESS: This is a 78 y.o. male here for 79-month follow-up of chronic medical conditions, diabetes in particular. Overall doing well. Recently saw kidney doctor on 12/23/2023.  Stable.  No concerns.  Office visit notes reviewed with patient. Has painless bump on left side of posterior head for years Has no complaints or medical concerns today. Most recent hemoglobin A1c of 5.7 per patient. Lab Results  Component Value Date   HGBA1C 6.1 (A) 06/24/2023     HPI   Prior to Admission medications   Medication Sig Start Date End Date Taking? Authorizing Provider  allopurinol  (ZYLOPRIM ) 300 MG tablet TAKE ONE AND ONE-HALF TABLETS BY MOUTH DAILY 10/07/23  Yes Deveshwar, Clydie Darter, MD  Arginine 500 MG CAPS Take by mouth as needed.   Yes [provider]  BEPREVE 1.5 % SOLN 1 drop 2 (two) times daily. 02/29/20  Yes [provider]  blood glucose meter kit and supplies Dispense based on patient and insurance preference. Use up to four times daily as directed. (FOR ICD-10 E10.9, E11.9). 02/07/21  Yes Nevea Spiewak, Isidro Margo, MD  colchicine  0.6 MG tablet TAKE ONE TABLET BY MOUTH DAILY AS NEEDED 10/07/23  Yes Deveshwar, Clydie Darter, MD  Continuous Blood Gluc Sensor (FREESTYLE LIBRE SENSOR SYSTEM) MISC 1 Device by Does not apply route daily. 03/07/21  Yes Bricen Victory Jose, MD  Cyanocobalamin (VITAMIN B-12 PO) Take by mouth daily.   Yes [provider]  Dupilumab (DUPIXENT [place]) Inject into the skin.   Yes [provider]  Finerenone  (KERENDIA ) 10 MG TABS Take 1 tablet (10 mg total) by mouth daily. 06/24/23  Yes Mclain Freer, Isidro Margo, MD  glipiZIDE  (GLUCOTROL ) 5 MG tablet TAKE ONE TABLET BY MOUTH TWICE DAILY BEFORE MEALS 11/10/23  Yes Gatlin Kittell  Jose, MD  ibuprofen (ADVIL) 200 MG tablet Take 200 mg by mouth every 6 (six) hours as needed for moderate pain.   Yes [provider]  Multiple Vitamins-Minerals (ZINC PO) Take by mouth daily.   Yes [provider]  sildenafil  (VIAGRA ) 100 MG tablet Take one-half to one tablet by mouth daily as needed for erectile dysfunction 06/04/23  Yes Wesleigh Markovic, [place], MD  SYSTANE ULTRA 0.4-0.3 % SOLN SMARTSIG:1 Drop(s) In Eye(s) As Needed 11/23/19  Yes [provider]  clobetasol cream (TEMOVATE) 0.05 % Apply 1 application  topically 2 (two) times daily. Patient not taking: Reported on 12/28/2023    [provider]  rosuvastatin  (CRESTOR ) 10 MG tablet Take 1 tablet (10 mg total) by mouth daily. Patient not taking: Reported on 12/28/2023 07/30/20   Elvira Hammersmith, MD    Allergies  Allergen Reactions   Other Hives and Itching    strawberries, wine, and corn   Shellfish Allergy     Patient Active Problem List   Diagnosis Date Noted   Dyspepsia 06/24/2023   Primary osteoarthritis of both shoulders 05/24/2021   Primary osteoarthritis of both hands 05/24/2021   Primary osteoarthritis of both feet 05/24/2021   DDD (degenerative disc disease), cervical 05/24/2021   Prurigo nodularis 05/24/2021   Chronic renal impairment, stage 3a (HCC) 05/24/2021   Uncontrolled type 2 diabetes mellitus with hyperglycemia (HCC) 07/30/2020   Severe nonproliferative diabetic retinopathy  of left eye, with macular edema, associated with type 2 diabetes mellitus (HCC) 02/21/2020   Severe nonproliferative diabetic retinopathy of right eye, with macular edema, associated with type 2 diabetes mellitus (HCC) 02/21/2020   Nuclear sclerotic cataract of both eyes 02/21/2020   Retinal hemorrhage of left eye 02/21/2020   Retinal exudates and deposits 02/21/2020   Alternating exotropia 02/21/2020   Diabetes mellitus (HCC) 10/06/2019   Spinal stenosis 10/06/2019   History of gout 10/06/2019    History of rheumatoid arthritis 10/06/2019   Osteoarthritis of wrist 11/05/2018   Gout 09/16/2018    Past Medical History:  Diagnosis Date   Arthritis    Cataract    Diabetes mellitus without complication (HCC)    Spinal stenosis    per patient    Past Surgical History:  Procedure Laterality Date   CATARACT EXTRACTION, BILATERAL     04/2023, 05/2023    Social History   Socioeconomic History   Marital status: Single    Spouse name: Not on file   Number of children: Not on file   Years of education: Not on file   Highest education level: Not on file  Occupational History   Not on file  Tobacco Use   Smoking status: Former    Types: Cigarettes    Passive exposure: Past   Smokeless tobacco: Never  Vaping Use   Vaping status: Never Used  Substance and Sexual Activity   Alcohol use: Yes    Comment: occ   Drug use: Never   Sexual activity: Not on file  Other Topics Concern   Not on file  Social History Narrative   Not on file   Social Drivers of Health   Financial Resource Strain: Not on file  Food Insecurity: Not on file  Transportation Needs: Not on file  Physical Activity: Not on file  Stress: Not on file  Social Connections: Not on file  Intimate Partner Violence: Not on file    Family History  Problem Relation Age of Onset   Diabetes Mother    Hypertension Mother    Diabetes Brother    Audiological scientist    Healthy Daughter    Healthy Daughter    Healthy Son    Healthy Son    Scoliosis Son      Review of Systems  Constitutional: Negative.  Negative for chills and fever.  HENT: Negative.  Negative for congestion and sore throat.   Respiratory: Negative.  Negative for cough and shortness of breath.   Cardiovascular: Negative.  Negative for chest pain and palpitations.  Gastrointestinal:  Negative for abdominal pain, diarrhea, nausea and vomiting.  Genitourinary: Negative.  Negative for dysuria and hematuria.  Skin: Negative.  Negative for rash.   Neurological: Negative.  Negative for dizziness and headaches.  All other systems reviewed and are negative.   Vitals:   12/28/23 0950  BP: 136/80  Pulse: (!) 52  SpO2: 99%    Physical Exam Vitals reviewed.  Constitutional:      Appearance: Normal appearance.  HENT:     Head: Normocephalic.     Mouth/Throat:     Mouth: Mucous membranes are moist.     Pharynx: Oropharynx is clear.  Eyes:     Extraocular Movements: Extraocular movements intact.     Conjunctiva/sclera: Conjunctivae normal.     Pupils: Pupils are equal, round, and reactive to light.  Cardiovascular:     Rate and Rhythm: Normal rate and regular rhythm.     Pulses: Normal  pulses.     Heart sounds: Normal heart sounds.  Pulmonary:     Effort: Pulmonary effort is normal.     Breath sounds: Normal breath sounds.  Musculoskeletal:     Cervical back: No tenderness.  Lymphadenopathy:     Cervical: No cervical adenopathy.  Skin:    General: Skin is warm and dry.     Capillary Refill: Capillary refill takes less than 2 seconds.     Comments: Nontender sebaceous cyst to left occipital scalp area  Neurological:     General: No focal deficit present.     Mental Status: He is alert and oriented to person, place, and time.  Psychiatric:        Mood and Affect: Mood normal.        Behavior: Behavior normal.      ASSESSMENT & PLAN: A total of 42 minutes was spent with the patient and counseling/coordination of care regarding preparing for this visit, review of most recent office visit notes, review of most recent nephrologist office visit notes, review of multiple chronic medical conditions and their management, cardiovascular risks associated with diabetes, review of all medications, review of most recent bloodwork results, review of health maintenance items, education on nutrition, prognosis, documentation, and need for follow up.   Problem List Items Addressed This Visit       Endocrine   Diabetes mellitus  (HCC) - Primary   Well-controlled diabetes. Off medications. Diet controlled Cardiovascular risks associated with diabetes discussed Diet and nutrition discussed      Severe nonproliferative diabetic retinopathy of left eye, with macular edema, associated with type 2 diabetes mellitus (HCC)   Stable.  Follows up with ophthalmologist on a regular basis.          Musculoskeletal and Integument   Sebaceous cyst   Of scalp.  Chronic without complications.  No concerns.        Genitourinary   Chronic renal impairment, stage 3a (HCC)   Advised to stay well-hydrated and avoid NSAIDs Chronic stable condition        Other   History of gout   Stable with no recent flareups. Continue allopurinol  300 mg daily      History of rheumatoid arthritis   Stable without complications No concerns.        Patient Instructions  Health Maintenance After Age 43 After age 45, you are at a higher risk for certain long-term diseases and infections as well as injuries from falls. Falls are a major cause of broken bones and head injuries in people who are older than age 57. Getting regular preventive care can help to keep you healthy and well. Preventive care includes getting regular testing and making lifestyle changes as recommended by your health care provider. Talk with your health care provider about: Which screenings and tests you should have. A screening is a test that checks for a disease when you have no symptoms. A diet and exercise plan that is right for you. What should I know about screenings and tests to prevent falls? Screening and testing are the best ways to find a health problem early. Early diagnosis and treatment give you the best chance of managing medical conditions that are common after age 56. Certain conditions and lifestyle choices may make you more likely to have a fall. Your health care provider may recommend: Regular vision checks. Poor vision and conditions such as  cataracts can make you more likely to have a fall. If you wear glasses,  make sure to get your prescription updated if your vision changes. Medicine review. Work with your health care provider to regularly review all of the medicines you are taking, including over-the-counter medicines. Ask your health care provider about any side effects that may make you more likely to have a fall. Tell your health care provider if any medicines that you take make you feel dizzy or sleepy. Strength and balance checks. Your health care provider may recommend certain tests to check your strength and balance while standing, walking, or changing positions. Foot health exam. Foot pain and numbness, as well as not wearing proper footwear, can make you more likely to have a fall. Screenings, including: Osteoporosis screening. Osteoporosis is a condition that causes the bones to get weaker and break more easily. Blood pressure screening. Blood pressure changes and medicines to control blood pressure can make you feel dizzy. Depression screening. You may be more likely to have a fall if you have a fear of falling, feel depressed, or feel unable to do activities that you used to do. Alcohol use screening. Using too much alcohol can affect your balance and may make you more likely to have a fall. Follow these instructions at home: Lifestyle Do not drink alcohol if: Your health care provider tells you not to drink. If you drink alcohol: Limit how much you have to: 0-1 drink a day for women. 0-2 drinks a day for men. Know how much alcohol is in your drink. In the [place] one drink equals one 12 oz bottle of beer (355 mL), one 5 oz glass of wine (148 mL), or one 1 oz glass of hard liquor (44 mL). Do not use any products that contain nicotine or tobacco. These products include cigarettes, chewing tobacco, and vaping devices, such as e-cigarettes. If you need help quitting, ask your health care provider. Activity  Follow a  regular exercise program to stay fit. This will help you maintain your balance. Ask your health care provider what types of exercise are appropriate for you. If you need a cane or walker, use it as recommended by your health care provider. Wear supportive shoes that have nonskid soles. Safety  Remove any tripping hazards, such as rugs, cords, and clutter. Install safety equipment such as grab bars in bathrooms and safety rails on stairs. Keep rooms and walkways well-lit. General instructions Talk with your health care provider about your risks for falling. Tell your health care provider if: You fall. Be sure to tell your health care provider about all falls, even ones that seem minor. You feel dizzy, tiredness (fatigue), or off-balance. Take over-the-counter and prescription medicines only as told by your health care provider. These include supplements. Eat a healthy diet and maintain a healthy weight. A healthy diet includes low-fat dairy products, low-fat (lean) meats, and fiber from whole grains, beans, and lots of fruits and vegetables. Stay current with your vaccines. Schedule regular health, dental, and eye exams. Summary Having a healthy lifestyle and getting preventive care can help to protect your health and wellness after age 2. Screening and testing are the best way to find a health problem early and help you avoid having a fall. Early diagnosis and treatment give you the best chance for managing medical conditions that are more common for people who are older than age 69. Falls are a major cause of broken bones and head injuries in people who are older than age 75. Take precautions to prevent a fall at home. Work with your  health care provider to learn what changes you can make to improve your health and wellness and to prevent falls. This information is not intended to replace advice given to you by your health care provider. Make sure you discuss any questions you have with your  health care provider. Document Revised: 03/25/2021 Document Reviewed: 03/25/2021 Elsevier Patient Education  2024 Elsevier Inc.     Maryagnes Small, MD [facility]

## 2023-12-28 NOTE — Assessment & Plan Note (Signed)
 Stable without complications No concerns.

## 2023-12-28 NOTE — Patient Instructions (Signed)
 Health Maintenance After Age 79 After age 4, you are at a higher risk for certain long-term diseases and infections as well as injuries from falls. Falls are a major cause of broken bones and head injuries in people who are older than age 47. Getting regular preventive care can help to keep you healthy and well. Preventive care includes getting regular testing and making lifestyle changes as recommended by your health care provider. Talk with your health care provider about: Which screenings and tests you should have. A screening is a test that checks for a disease when you have no symptoms. A diet and exercise plan that is right for you. What should I know about screenings and tests to prevent falls? Screening and testing are the best ways to find a health problem early. Early diagnosis and treatment give you the best chance of managing medical conditions that are common after age 37. Certain conditions and lifestyle choices may make you more likely to have a fall. Your health care provider may recommend: Regular vision checks. Poor vision and conditions such as cataracts can make you more likely to have a fall. If you wear glasses, make sure to get your prescription updated if your vision changes. Medicine review. Work with your health care provider to regularly review all of the medicines you are taking, including over-the-counter medicines. Ask your health care provider about any side effects that may make you more likely to have a fall. Tell your health care provider if any medicines that you take make you feel dizzy or sleepy. Strength and balance checks. Your health care provider may recommend certain tests to check your strength and balance while standing, walking, or changing positions. Foot health exam. Foot pain and numbness, as well as not wearing proper footwear, can make you more likely to have a fall. Screenings, including: Osteoporosis screening. Osteoporosis is a condition that causes  the bones to get weaker and break more easily. Blood pressure screening. Blood pressure changes and medicines to control blood pressure can make you feel dizzy. Depression screening. You may be more likely to have a fall if you have a fear of falling, feel depressed, or feel unable to do activities that you used to do. Alcohol use screening. Using too much alcohol can affect your balance and may make you more likely to have a fall. Follow these instructions at home: Lifestyle Do not drink alcohol if: Your health care provider tells you not to drink. If you drink alcohol: Limit how much you have to: 0-1 drink a day for women. 0-2 drinks a day for men. Know how much alcohol is in your drink. In the [place] one drink equals one 12 oz bottle of beer (355 mL), one 5 oz glass of wine (148 mL), or one 1 oz glass of hard liquor (44 mL). Do not use any products that contain nicotine or tobacco. These products include cigarettes, chewing tobacco, and vaping devices, such as e-cigarettes. If you need help quitting, ask your health care provider. Activity  Follow a regular exercise program to stay fit. This will help you maintain your balance. Ask your health care provider what types of exercise are appropriate for you. If you need a cane or walker, use it as recommended by your health care provider. Wear supportive shoes that have nonskid soles. Safety  Remove any tripping hazards, such as rugs, cords, and clutter. Install safety equipment such as grab bars in bathrooms and safety rails on stairs. Keep rooms and walkways  well-lit. General instructions Talk with your health care provider about your risks for falling. Tell your health care provider if: You fall. Be sure to tell your health care provider about all falls, even ones that seem minor. You feel dizzy, tiredness (fatigue), or off-balance. Take over-the-counter and prescription medicines only as told by your health care provider. These include  supplements. Eat a healthy diet and maintain a healthy weight. A healthy diet includes low-fat dairy products, low-fat (lean) meats, and fiber from whole grains, beans, and lots of fruits and vegetables. Stay current with your vaccines. Schedule regular health, dental, and eye exams. Summary Having a healthy lifestyle and getting preventive care can help to protect your health and wellness after age 11. Screening and testing are the best way to find a health problem early and help you avoid having a fall. Early diagnosis and treatment give you the best chance for managing medical conditions that are more common for people who are older than age 28. Falls are a major cause of broken bones and head injuries in people who are older than age 48. Take precautions to prevent a fall at home. Work with your health care provider to learn what changes you can make to improve your health and wellness and to prevent falls. This information is not intended to replace advice given to you by your health care provider. Make sure you discuss any questions you have with your health care provider. Document Revised: 03/25/2021 Document Reviewed: 03/25/2021 Elsevier Patient Education  2024 [employer].

## 2023-12-28 NOTE — Assessment & Plan Note (Signed)
 Well-controlled diabetes. Off medications. Diet controlled Cardiovascular risks associated with diabetes discussed Diet and nutrition discussed

## 2024-01-01 DIAGNOSIS — L281 Prurigo nodularis: Secondary | ICD-10-CM | POA: Diagnosis not present

## 2024-01-06 DIAGNOSIS — H35372 Puckering of macula, left eye: Secondary | ICD-10-CM | POA: Diagnosis not present

## 2024-01-06 DIAGNOSIS — H359 Unspecified retinal disorder: Secondary | ICD-10-CM | POA: Diagnosis not present

## 2024-01-06 DIAGNOSIS — H3562 Retinal hemorrhage, left eye: Secondary | ICD-10-CM | POA: Diagnosis not present

## 2024-01-06 DIAGNOSIS — E113412 Type 2 diabetes mellitus with severe nonproliferative diabetic retinopathy with macular edema, left eye: Secondary | ICD-10-CM | POA: Diagnosis not present

## 2024-01-06 DIAGNOSIS — Z961 Presence of intraocular lens: Secondary | ICD-10-CM | POA: Diagnosis not present

## 2024-01-13 DIAGNOSIS — H35372 Puckering of macula, left eye: Secondary | ICD-10-CM | POA: Diagnosis not present

## 2024-01-13 DIAGNOSIS — Z961 Presence of intraocular lens: Secondary | ICD-10-CM | POA: Diagnosis not present

## 2024-01-13 DIAGNOSIS — H359 Unspecified retinal disorder: Secondary | ICD-10-CM | POA: Diagnosis not present

## 2024-01-13 DIAGNOSIS — E113411 Type 2 diabetes mellitus with severe nonproliferative diabetic retinopathy with macular edema, right eye: Secondary | ICD-10-CM | POA: Diagnosis not present

## 2024-01-13 DIAGNOSIS — H3562 Retinal hemorrhage, left eye: Secondary | ICD-10-CM | POA: Diagnosis not present

## 2024-01-13 LAB — HM DIABETES EYE EXAM

## 2024-01-22 NOTE — Progress Notes (Deleted)
 Office Visit Note  Patient: Steven Knapp             Date of Birth: May 28, 1946           MRN: [id number]             PCP: Georgina Quint, MD Referring: Georgina Quint, * Visit Date: 02/05/2024 Occupation: @GUAROCC @  Subjective:  No chief complaint on file.   History of Present Illness: Steven Knapp is a 78 y.o. male ***     Activities of Daily Living:  Patient reports morning stiffness for *** {minute/hour:19697}.   Patient {ACTIONS;DENIES/REPORTS:21021675::"Denies"} nocturnal pain.  Difficulty dressing/grooming: {ACTIONS;DENIES/REPORTS:21021675::"Denies"} Difficulty climbing stairs: {ACTIONS;DENIES/REPORTS:21021675::"Denies"} Difficulty getting out of chair: {ACTIONS;DENIES/REPORTS:21021675::"Denies"} Difficulty using hands for taps, buttons, cutlery, and/or writing: {ACTIONS;DENIES/REPORTS:21021675::"Denies"}  No Rheumatology ROS completed.   PMFS History:  Patient Active Problem List   Diagnosis Date Noted  . Sebaceous cyst 12/28/2023  . Primary osteoarthritis of both shoulders 05/24/2021  . Primary osteoarthritis of both hands 05/24/2021  . Primary osteoarthritis of both feet 05/24/2021  . DDD (degenerative disc disease), cervical 05/24/2021  . Prurigo nodularis 05/24/2021  . Chronic renal impairment, stage 3a (HCC) 05/24/2021  . Uncontrolled type 2 diabetes mellitus with hyperglycemia (HCC) 07/30/2020  . Severe nonproliferative diabetic retinopathy of left eye, with macular edema, associated with type 2 diabetes mellitus (HCC) 02/21/2020  . Severe nonproliferative diabetic retinopathy of right eye, with macular edema, associated with type 2 diabetes mellitus (HCC) 02/21/2020  . Nuclear sclerotic cataract of both eyes 02/21/2020  . Retinal hemorrhage of left eye 02/21/2020  . Retinal exudates and deposits 02/21/2020  . Alternating exotropia 02/21/2020  . Diabetes mellitus (HCC) 10/06/2019  . Spinal stenosis 10/06/2019  . History of gout 10/06/2019  .  History of rheumatoid arthritis 10/06/2019  . 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 Gout 09/16/2018    Past Medical History:  Diagnosis Date  . Arthritis   . Cataract   . Diabetes mellitus without complication (HCC)   . Spinal stenosis    per patient    Family History  Problem Relation Age of Onset  . Diabetes Mother   . Hypertension Mother   . Diabetes Brother   . Healthy Brother   . Healthy Daughter   . Healthy Daughter   . Healthy Son   . Healthy Son   . Scoliosis Son    Past Surgical History:  Procedure Laterality Date  . CATARACT EXTRACTION, BILATERAL     04/2023, 05/2023   Social History   Social History Narrative  . Not on file   Immunization History  Administered Date(s) Administered  . Fluad Quad(high Dose 65+) 07/30/2020, 08/01/2022  . Moderna Sars-Covid-2 Vaccination 11/28/2019, 12/29/2019, 07/18/2020  . PNEUMOCOCCAL CONJUGATE-20 05/06/2022  . Pneumococcal Polysaccharide-23 10/06/2019  . Zoster Recombinant(Shingrix) 05/06/2022     Objective: Vital Signs: There were no vitals taken for this visit.   Physical Exam   Musculoskeletal Exam: ***  CDAI Exam: CDAI Score: -- Patient Global: --; Provider Global: -- Swollen: --; Tender: -- Joint Exam 02/05/2024   No joint exam has been documented for this visit   There is currently no information documented on the homunculus. Go to the Rheumatology activity and complete the homunculus joint exam.  Investigation: No additional findings.  Imaging: No results found.  Recent Labs: Lab Results  Component Value Date   WBC 6.4 08/06/2023   HGB 12.9 (L) 08/06/2023   PLT 284 08/06/2023   NA 138 08/06/2023   K 4.4 08/06/2023  CL 104 08/06/2023   CO2 26 08/06/2023   GLUCOSE 183 (H) 08/06/2023   BUN 19 08/06/2023   CREATININE 1.52 (H) 08/06/2023   BILITOT 0.4 06/24/2023   ALKPHOS 57 06/24/2023   AST 18 06/24/2023   ALT 13 06/24/2023   PROT 7.4 06/24/2023   ALBUMIN 4.0 06/24/2023   CALCIUM 9.8  08/06/2023   GFRAA 45 (L) 04/30/2021    Speciality Comments: Allopurinol-nausea  Procedures:  No procedures performed Allergies: Other and Shellfish allergy   Assessment / Plan:     Visit Diagnoses: No diagnosis found.  Orders: No orders of the defined types were placed in this encounter.  No orders of the defined types were placed in this encounter.   Face-to-face time spent with patient was *** minutes. Greater than 50% of time was spent in counseling and coordination of care.  Follow-Up Instructions: No follow-ups on file.   Ellen Henri, CMA  Note - This record has been created using Animal nutritionist.  Chart creation errors have been sought, but may not always  have been located. Such creation errors do not reflect on  the standard of medical care.

## 2024-02-05 ENCOUNTER — Ambulatory Visit: Payer: Medicare HMO | Admitting: Rheumatology

## 2024-02-05 DIAGNOSIS — R768 Other specified abnormal immunological findings in serum: Secondary | ICD-10-CM

## 2024-02-05 DIAGNOSIS — L281 Prurigo nodularis: Secondary | ICD-10-CM

## 2024-02-05 DIAGNOSIS — Z5181 Encounter for therapeutic drug level monitoring: Secondary | ICD-10-CM

## 2024-02-05 DIAGNOSIS — E113413 Type 2 diabetes mellitus with severe nonproliferative diabetic retinopathy with macular edema, bilateral: Secondary | ICD-10-CM

## 2024-02-05 DIAGNOSIS — E790 Hyperuricemia without signs of inflammatory arthritis and tophaceous disease: Secondary | ICD-10-CM

## 2024-02-05 DIAGNOSIS — E1165 Type 2 diabetes mellitus with hyperglycemia: Secondary | ICD-10-CM

## 2024-02-05 DIAGNOSIS — M5030 Other cervical disc degeneration, unspecified cervical region: Secondary | ICD-10-CM

## 2024-02-05 DIAGNOSIS — M19041 Primary osteoarthritis, right hand: Secondary | ICD-10-CM

## 2024-02-05 DIAGNOSIS — N1831 Chronic kidney disease, stage 3a: Secondary | ICD-10-CM

## 2024-02-05 DIAGNOSIS — Z789 Other specified health status: Secondary | ICD-10-CM

## 2024-02-05 DIAGNOSIS — M720 Palmar fascial fibromatosis [Dupuytren]: Secondary | ICD-10-CM

## 2024-02-05 DIAGNOSIS — M19071 Primary osteoarthritis, right ankle and foot: Secondary | ICD-10-CM

## 2024-02-05 DIAGNOSIS — M19012 Primary osteoarthritis, left shoulder: Secondary | ICD-10-CM

## 2024-02-05 DIAGNOSIS — M1A09X0 Idiopathic chronic gout, multiple sites, without tophus (tophi): Secondary | ICD-10-CM

## 2024-02-05 DIAGNOSIS — M170 Bilateral primary osteoarthritis of knee: Secondary | ICD-10-CM

## 2024-02-05 DIAGNOSIS — H2513 Age-related nuclear cataract, bilateral: Secondary | ICD-10-CM

## 2024-02-11 DIAGNOSIS — H359 Unspecified retinal disorder: Secondary | ICD-10-CM | POA: Diagnosis not present

## 2024-02-11 DIAGNOSIS — H35372 Puckering of macula, left eye: Secondary | ICD-10-CM | POA: Diagnosis not present

## 2024-02-11 DIAGNOSIS — E113412 Type 2 diabetes mellitus with severe nonproliferative diabetic retinopathy with macular edema, left eye: Secondary | ICD-10-CM | POA: Diagnosis not present

## 2024-02-11 DIAGNOSIS — H3562 Retinal hemorrhage, left eye: Secondary | ICD-10-CM | POA: Diagnosis not present

## 2024-03-03 DIAGNOSIS — H3562 Retinal hemorrhage, left eye: Secondary | ICD-10-CM | POA: Diagnosis not present

## 2024-03-03 DIAGNOSIS — H359 Unspecified retinal disorder: Secondary | ICD-10-CM | POA: Diagnosis not present

## 2024-03-03 DIAGNOSIS — E113412 Type 2 diabetes mellitus with severe nonproliferative diabetic retinopathy with macular edema, left eye: Secondary | ICD-10-CM | POA: Diagnosis not present

## 2024-03-03 DIAGNOSIS — H35372 Puckering of macula, left eye: Secondary | ICD-10-CM | POA: Diagnosis not present

## 2024-03-03 DIAGNOSIS — E113411 Type 2 diabetes mellitus with severe nonproliferative diabetic retinopathy with macular edema, right eye: Secondary | ICD-10-CM | POA: Diagnosis not present

## 2024-03-10 ENCOUNTER — Telehealth: Payer: Self-pay | Admitting: Emergency Medicine

## 2024-03-10 NOTE — Telephone Encounter (Signed)
 Copied from CRM [id number]. Topic: General - Other >> Mar 09, 2024  4:08 PM [place] wrote: Reason for CRM: [facility] sent a chronic illness form to office, need to know if it was received, they are needing it filled out and signed and sent back - please call patient [phone]

## 2024-03-15 NOTE — Telephone Encounter (Signed)
 Forms has been signed and faxed on 02/09/2024. Fax confirmation received

## 2024-03-17 DIAGNOSIS — H35372 Puckering of macula, left eye: Secondary | ICD-10-CM | POA: Diagnosis not present

## 2024-03-17 DIAGNOSIS — H3562 Retinal hemorrhage, left eye: Secondary | ICD-10-CM | POA: Diagnosis not present

## 2024-03-17 DIAGNOSIS — E113411 Type 2 diabetes mellitus with severe nonproliferative diabetic retinopathy with macular edema, right eye: Secondary | ICD-10-CM | POA: Diagnosis not present

## 2024-03-17 DIAGNOSIS — H359 Unspecified retinal disorder: Secondary | ICD-10-CM | POA: Diagnosis not present

## 2024-03-17 DIAGNOSIS — E113412 Type 2 diabetes mellitus with severe nonproliferative diabetic retinopathy with macular edema, left eye: Secondary | ICD-10-CM | POA: Diagnosis not present

## 2024-04-02 ENCOUNTER — Other Ambulatory Visit: Payer: Self-pay | Admitting: Emergency Medicine

## 2024-04-21 DIAGNOSIS — E113411 Type 2 diabetes mellitus with severe nonproliferative diabetic retinopathy with macular edema, right eye: Secondary | ICD-10-CM | POA: Diagnosis not present

## 2024-04-21 DIAGNOSIS — E113412 Type 2 diabetes mellitus with severe nonproliferative diabetic retinopathy with macular edema, left eye: Secondary | ICD-10-CM | POA: Diagnosis not present

## 2024-04-21 DIAGNOSIS — H359 Unspecified retinal disorder: Secondary | ICD-10-CM | POA: Diagnosis not present

## 2024-04-21 DIAGNOSIS — H3562 Retinal hemorrhage, left eye: Secondary | ICD-10-CM | POA: Diagnosis not present

## 2024-04-21 DIAGNOSIS — H35372 Puckering of macula, left eye: Secondary | ICD-10-CM | POA: Diagnosis not present

## 2024-04-28 DIAGNOSIS — E113412 Type 2 diabetes mellitus with severe nonproliferative diabetic retinopathy with macular edema, left eye: Secondary | ICD-10-CM | POA: Diagnosis not present

## 2024-04-28 DIAGNOSIS — H35372 Puckering of macula, left eye: Secondary | ICD-10-CM | POA: Diagnosis not present

## 2024-04-28 DIAGNOSIS — H3562 Retinal hemorrhage, left eye: Secondary | ICD-10-CM | POA: Diagnosis not present

## 2024-06-03 ENCOUNTER — Ambulatory Visit

## 2024-06-03 VITALS — Ht 72.0 in | Wt 222.0 lb

## 2024-06-03 DIAGNOSIS — E119 Type 2 diabetes mellitus without complications: Secondary | ICD-10-CM | POA: Diagnosis not present

## 2024-06-03 DIAGNOSIS — E1169 Type 2 diabetes mellitus with other specified complication: Secondary | ICD-10-CM

## 2024-06-03 DIAGNOSIS — Z122 Encounter for screening for malignant neoplasm of respiratory organs: Secondary | ICD-10-CM

## 2024-06-03 DIAGNOSIS — Z1159 Encounter for screening for other viral diseases: Secondary | ICD-10-CM | POA: Diagnosis not present

## 2024-06-03 DIAGNOSIS — Z0000 Encounter for general adult medical examination without abnormal findings: Secondary | ICD-10-CM | POA: Diagnosis not present

## 2024-06-03 DIAGNOSIS — Z87891 Personal history of nicotine dependence: Secondary | ICD-10-CM

## 2024-06-03 NOTE — Progress Notes (Signed)
 Subjective:   Steven Knapp is a 78 y.o. who presents for a Medicare Wellness preventive visit.  As a reminder, Annual Wellness Visits don't include a physical exam, and some assessments may be limited, especially if this visit is performed virtually. We may recommend an in-person follow-up visit with your provider if needed.  Visit Complete: Virtual I connected with  Steven Knapp on 06/03/24 by a audio enabled telemedicine application and verified that I am speaking with the correct person using two identifiers.  Patient Location: Home  Provider Location: Office/Clinic  I discussed the limitations of evaluation and management by telemedicine. The patient expressed understanding and agreed to proceed.  Vital Signs: Because this visit was a virtual/telehealth visit, some criteria may be missing or patient reported. Any vitals not documented were not able to be obtained and vitals that have been documented are patient reported.  VideoDeclined- This patient declined Librarian, academic. Therefore the visit was completed with audio only.  Persons Participating in Visit: Patient.  AWV Questionnaire: No: Patient Medicare AWV questionnaire was not completed prior to this visit.  Cardiac Risk Factors include: advanced age (>36men, >23 women);diabetes mellitus;male gender;obesity (BMI >30kg/m2)     Objective:    Today's Vitals   06/03/24 1136  Weight: 222 lb (100.7 kg)  Height: 6' (1.829 m)   Body mass index is 30.11 kg/m.     06/03/2024   11:36 AM 08/13/2023   12:33 PM 11/22/2019    2:08 PM 10/12/2019    2:49 PM  Advanced Directives  Does Patient Have a Medical Advance Directive? Yes Yes No No  Type of Estate agent of [place];Living will     Does patient want to make changes to medical advance directive?  No - Patient declined    Copy of Healthcare Power of Attorney in Chart? No - copy requested     Would patient like information on  creating a medical advance directive?   Yes (MAU/Ambulatory/Procedural Areas - Information given) No - Patient declined    Current Medications (verified) Outpatient Encounter Medications as of 06/03/2024  Medication Sig   allopurinol  (ZYLOPRIM ) 300 MG tablet TAKE ONE AND ONE-HALF TABLETS BY MOUTH DAILY   Arginine 500 MG CAPS Take by mouth as needed.   BEPREVE 1.5 % SOLN 1 drop 2 (two) times daily.   blood glucose meter kit and supplies Dispense based on patient and insurance preference. Use up to four times daily as directed. (FOR ICD-10 E10.9, E11.9).   colchicine  0.6 MG tablet TAKE ONE TABLET BY MOUTH DAILY AS NEEDED   Continuous Blood Gluc Sensor (FREESTYLE LIBRE SENSOR SYSTEM) MISC 1 Device by Does not apply route daily.   Cyanocobalamin (VITAMIN B-12 PO) Take by mouth daily.   Dupilumab (DUPIXENT [place]) Inject into the skin.   Finerenone  (KERENDIA ) 10 MG TABS Take 1 tablet (10 mg total) by mouth daily.   glipiZIDE  (GLUCOTROL ) 5 MG tablet TAKE ONE TABLET BY MOUTH TWICE DAILY BEFORE MEALS   ibuprofen (ADVIL) 200 MG tablet Take 200 mg by mouth every 6 (six) hours as needed for moderate pain.   Multiple Vitamins-Minerals (ZINC PO) Take by mouth daily.   sildenafil  (VIAGRA ) 100 MG tablet Take one-half to one tablet by mouth daily as needed for erectile dysfunction   SYSTANE ULTRA 0.4-0.3 % SOLN SMARTSIG:1 Drop(s) In Eye(s) As Needed   clobetasol cream (TEMOVATE) 0.05 % Apply 1 application  topically 2 (two) times daily. (Patient not taking: Reported on 06/03/2024)  rosuvastatin  (CRESTOR ) 10 MG tablet Take 1 tablet (10 mg total) by mouth daily. (Patient not taking: Reported on 06/03/2024)   No facility-administered encounter medications on file as of 06/03/2024.    Allergies (verified) Other and Shellfish allergy   History: Past Medical History:  Diagnosis Date   Arthritis    Cataract    Diabetes mellitus without complication (HCC)    Spinal stenosis    per patient   Past Surgical  History:  Procedure Laterality Date   CATARACT EXTRACTION, BILATERAL     04/2023, 05/2023   Family History  Problem Relation Age of Onset   Diabetes Mother    Hypertension Mother    Diabetes Brother    Soil scientist Daughter    Healthy Daughter    Healthy Son    Healthy Son    Scoliosis Son    Social History   Socioeconomic History   Marital status: Single    Spouse name: Not on file   Number of children: Not on file   Years of education: Not on file   Highest education level: Not on file  Occupational History   Not on file  Tobacco Use   Smoking status: Former    Current packs/day: 0.00    Average packs/day: 1 pack/day for 51.0 years (51.0 ttl pk-yrs)    Types: Cigarettes    Start date: 11/17/1970    Quit date: 11/17/2021    Years since quitting: 2.5    Passive exposure: Past   Smokeless tobacco: Never  Vaping Use   Vaping status: Never Used  Substance and Sexual Activity   Alcohol use: Yes    Alcohol/week: 1.0 standard drink of alcohol    Types: 1 Glasses of wine per week    Comment: occ   Drug use: Never   Sexual activity: Yes  Other Topics Concern   Not on file  Social History Narrative   Single   Social Drivers of Health   Financial Resource Strain: Low Risk  (06/03/2024)   Overall Financial Resource Strain (CARDIA)    Difficulty of Paying Living Expenses: Not hard at all  Food Insecurity: No Food Insecurity (06/03/2024)   Hunger Vital Sign    Worried About Running Out of Food in the Last Year: Never true    Ran Out of Food in the Last Year: Never true  Transportation Needs: No Transportation Needs (06/03/2024)   PRAPARE - Administrator, Civil Service (Medical): No    Lack of Transportation (Non-Medical): No  Physical Activity: Sufficiently Active (06/03/2024)   Exercise Vital Sign    Days of Exercise per Week: 5 days    Minutes of Exercise per Session: 30 min  Stress: No Stress Concern Present (06/03/2024)   [employer] of  Occupational Health - Occupational Stress Questionnaire    Feeling of Stress: Not at all  Social Connections: Moderately Integrated (06/03/2024)   Social Connection and Isolation Panel    Frequency of Communication with Friends and Family: More than three times a week    Frequency of Social Gatherings with Friends and Family: More than three times a week    Attends Religious Services: Never    Database administrator or Organizations: Yes    Attends Engineer, structural: More than 4 times per year    Marital Status: Married    Tobacco Counseling Counseling given: No    Clinical Intake:  Pre-visit preparation completed: Yes  Pain : No/denies  pain     BMI - recorded: 30.11 Nutritional Status: BMI > 30  Obese Nutritional Risks: None Diabetes: Yes CBG done?: No Did pt. bring in CBG monitor from home?: No  Lab Results  Component Value Date   HGBA1C 6.1 (A) 06/24/2023   HGBA1C 6.9 (H) 10/29/2022   HGBA1C 166 05/24/2022     How often do you need to have someone help you when you read instructions, pamphlets, or other written materials from your doctor or pharmacy?: 1 - Never  Interpreter Needed?: No  Information entered by :: Steven Knapp, CMA   Activities of Daily Living     06/03/2024   11:42 AM  In your present state of health, do you have any difficulty performing the following activities:  Hearing? 0  Vision? 0  Difficulty concentrating or making decisions? 0  Walking or climbing stairs? 0  Dressing or bathing? 0  Doing errands, shopping? 0  Comment sometimes Caregiver helps  Preparing Food and eating ? N  Using the Toilet? N  In the past six months, have you accidently leaked urine? N  Do you have problems with loss of bowel control? N  Managing your Medications? N  Managing your Finances? N  Housekeeping or managing your Housekeeping? N    Patient Care Team: Purcell Emil Schanz, MD as PCP - General (Internal Medicine) Octavia Bruckner,  MD as Consulting Physician (Ophthalmology)  I have updated your Care Teams any recent Medical Services you may have received from other providers in the past year.     Assessment:   This is a routine wellness examination for Steven Knapp.  Hearing/Vision screen Hearing Screening - Comments:: Denies hearing difficulties   Vision Screening - Comments:: Wears rx glasses - up to date with routine eye exams with Dr Octavia   Goals Addressed               This Visit's Progress     Patient Stated (pt-stated)        Patient stated that he's trying to manage his blood sugar levels       Depression Screen     06/03/2024   11:44 AM 12/28/2023    9:48 AM 06/24/2023    9:19 AM 11/05/2022    2:52 PM 05/06/2022    1:24 PM 04/07/2022    3:03 PM 01/28/2021    1:29 PM  PHQ 2/9 Scores  PHQ - 2 Score 0 0 0 0 0 0 0  PHQ- 9 Score 0          Fall Risk     06/03/2024   11:43 AM 12/28/2023    9:48 AM 06/24/2023    9:19 AM 11/05/2022    2:52 PM 05/06/2022    1:24 PM  Fall Risk   Falls in the past year? 0 0 0 0 0  Number falls in past yr: 0 0 0 0   Injury with Fall? 0 0 0 0   Risk for fall due to : No Fall Risks No Fall Risks No Fall Risks Impaired vision   Follow up Falls evaluation completed;Falls prevention discussed Falls evaluation completed Falls evaluation completed Falls evaluation completed       Data saved with a previous flowsheet row definition    MEDICARE RISK AT HOME:  Medicare Risk at Home Any stairs in or around the home?: Yes (inside and outside) If so, are there any without handrails?: No Home free of loose throw rugs in walkways, pet beds, electrical cords,  etc?: Yes Adequate lighting in your home to reduce risk of falls?: Yes Life alert?: No Use of a cane, walker or w/c?: Yes (cane) Grab bars in the bathroom?: Yes Shower chair or bench in shower?: No Elevated toilet seat or a handicapped toilet?: Yes  TIMED UP AND GO:  Was the test performed?  No  Cognitive Function: 6CIT  completed        06/03/2024   11:47 AM  6CIT Screen  What Year? 0 points  What month? 0 points  What time? 0 points  Count back from 20 0 points  Months in reverse 0 points  Repeat phrase 2 points  Total Score 2 points    Immunizations Immunization History  Administered Date(s) Administered   Fluad Quad(high Dose 65+) 07/30/2020, 08/01/2022   Moderna Sars-Covid-2 Vaccination 11/28/2019, 12/29/2019, 07/18/2020   PNEUMOCOCCAL CONJUGATE-20 05/06/2022   Pneumococcal Polysaccharide-23 10/06/2019   Zoster Recombinant(Shingrix ) 05/06/2022    Screening Tests Health Maintenance  Topic Date Due   Diabetic kidney evaluation - Urine ACR  Never done   Hepatitis C Screening  Never done   Lung Cancer Screening  Never done   FOOT EXAM  01/30/2021   Zoster Vaccines- Shingrix  (2 of 2) 07/01/2022   COVID-19 Vaccine (4 - 2024-25 season) 07/19/2023   HEMOGLOBIN A1C  12/25/2023   INFLUENZA VACCINE  06/17/2024   Diabetic kidney evaluation - eGFR measurement  08/05/2024   OPHTHALMOLOGY EXAM  01/12/2025   Medicare Annual Wellness (AWV)  06/03/2025   Pneumococcal Vaccine: 50+ Years  Completed   Hepatitis B Vaccines  Aged Out   HPV VACCINES  Aged Out   Meningococcal B Vaccine  Aged Out   DTaP/Tdap/Td  Discontinued   Colonoscopy  Discontinued    Health Maintenance  Health Maintenance Due  Topic Date Due   Diabetic kidney evaluation - Urine ACR  Never done   Hepatitis C Screening  Never done   Lung Cancer Screening  Never done   FOOT EXAM  01/30/2021   Zoster Vaccines- Shingrix  (2 of 2) 07/01/2022   COVID-19 Vaccine (4 - 2024-25 season) 07/19/2023   HEMOGLOBIN A1C  12/25/2023   Health Maintenance Items Addressed: Referral sent to [facility] (smoker/hx smoking), Labs Ordered: Hepatitis C, Diabetic Kidney Urine ACR and Hemoglobin A1C  Additional Screening:  Vision Screening: Recommended annual ophthalmology exams for early detection of glaucoma and other disorders of the  eye. Would you like a referral to an eye doctor? No    Dental Screening: Recommended annual dental exams for proper oral hygiene  Community Resource Referral / Chronic Care Management: CRR required this visit?  No   CCM required this visit?  No   Plan:    I have personally reviewed and noted the following in the patient's chart:   Medical and social history Use of alcohol, tobacco or illicit drugs  Current medications and supplements including opioid prescriptions. Patient is not currently taking opioid prescriptions. Functional ability and status Nutritional status Physical activity Advanced directives List of other physicians Hospitalizations, surgeries, and ER visits in previous 12 months Vitals Screenings to include cognitive, depression, and falls Referrals and appointments  In addition, I have reviewed and discussed with patient certain preventive protocols, quality metrics, and best practice recommendations. A written personalized care plan for preventive services as well as general preventive health recommendations were provided to patient.   Steven Knapp, CMA   06/03/2024   After Visit Summary: (MyChart) Due to this being a telephonic visit, the after  visit summary with patients personalized plan was offered to patient via MyChart   Notes: Scheduled an appt w/PCP to f/u on Diabetes for 06/28/2024.

## 2024-06-03 NOTE — Patient Instructions (Addendum)
 Steven Knapp , Thank you for taking time out of your busy schedule to complete your Annual Wellness Visit with me. I enjoyed our conversation and look forward to speaking with you again next year. I, as well as your care team,  appreciate your ongoing commitment to your health goals. Please review the following plan we discussed and let me know if I can assist you in the future. Your Game plan/ To Do List    Referrals: If you haven't heard from the office you've been referred to, please reach out to them at the phone provided.  Referral to [facility]) for a Diabetic foot exam; Ordered a Hemoglobin A1C, Diabetic Kidney Urine, and a Hepatitis C Screening.  Ordered a Lung Cancer Screening test Follow up Visits: /Next Medicare AWV with our clinical staff: 06/09/2025 Have you seen your provider in the last 6 months (3 months if uncontrolled diabetes)? Yes Next Office Visit with your provider: 06/28/2024  Clinician Recommendations:  Aim for 30 minutes of exercise or brisk walking, 6-8 glasses of water, and 5 servings of fruits and vegetables each day. Educated and advised on getting the 2nd Shingles vaccine in 2025.      This is a list of the screening recommended for you and due dates:  Health Maintenance  Topic Date Due   Yearly kidney health urinalysis for diabetes  Never done   Hepatitis C Screening  Never done   Screening for Lung Cancer  Never done   Complete foot exam   01/30/2021   Zoster (Shingles) Vaccine (2 of 2) 07/01/2022   COVID-19 Vaccine (4 - 2024-25 season) 07/19/2023   Hemoglobin A1C  12/25/2023   Flu Shot  06/17/2024   Yearly kidney function blood test for diabetes  08/05/2024   Eye exam for diabetics  01/12/2025   Medicare Annual Wellness Visit  06/03/2025   Pneumococcal Vaccine for age over 20  Completed   Hepatitis B Vaccine  Aged Out   HPV Vaccine  Aged Out   Meningitis B Vaccine  Aged Out   DTaP/Tdap/Td vaccine  Discontinued   Colon Cancer Screening   Discontinued    Advanced directives: (Copy Requested) Please bring a copy of your health care power of attorney and living will to the office to be added to your chart at your convenience. You can mail to [facility] [address] [place], [place] [address] or email to [email or web address] Advance Care Planning is important because it:  [x]  Makes sure you receive the medical care that is consistent with your values, goals, and preferences  [x]  It provides guidance to your family and loved ones and reduces their decisional burden about whether or not they are making the right decisions based on your wishes.  Follow the link provided in your after visit summary or read over the paperwork we have mailed to you to help you started getting your Advance Directives in place. If you need assistance in completing these, please reach out to us  so that we can help you!

## 2024-06-06 DIAGNOSIS — Z961 Presence of intraocular lens: Secondary | ICD-10-CM | POA: Diagnosis not present

## 2024-06-06 DIAGNOSIS — H40023 Open angle with borderline findings, high risk, bilateral: Secondary | ICD-10-CM | POA: Diagnosis not present

## 2024-06-08 ENCOUNTER — Other Ambulatory Visit: Payer: Self-pay | Admitting: Emergency Medicine

## 2024-06-08 ENCOUNTER — Other Ambulatory Visit (INDEPENDENT_AMBULATORY_CARE_PROVIDER_SITE_OTHER)

## 2024-06-08 DIAGNOSIS — Z1159 Encounter for screening for other viral diseases: Secondary | ICD-10-CM | POA: Diagnosis not present

## 2024-06-08 DIAGNOSIS — Z125 Encounter for screening for malignant neoplasm of prostate: Secondary | ICD-10-CM | POA: Diagnosis not present

## 2024-06-08 DIAGNOSIS — E1169 Type 2 diabetes mellitus with other specified complication: Secondary | ICD-10-CM | POA: Diagnosis not present

## 2024-06-08 LAB — PSA: PSA: 0.48 ng/mL (ref 0.10–4.00)

## 2024-06-09 LAB — HEPATITIS C ANTIBODY: Hepatitis C Ab: NONREACTIVE

## 2024-06-09 LAB — HEMOGLOBIN A1C: Hgb A1c MFr Bld: 6.9 % — ABNORMAL HIGH (ref 4.6–6.5)

## 2024-06-15 ENCOUNTER — Ambulatory Visit: Admitting: Podiatry

## 2024-06-16 DIAGNOSIS — H35372 Puckering of macula, left eye: Secondary | ICD-10-CM | POA: Diagnosis not present

## 2024-06-16 DIAGNOSIS — E113411 Type 2 diabetes mellitus with severe nonproliferative diabetic retinopathy with macular edema, right eye: Secondary | ICD-10-CM | POA: Diagnosis not present

## 2024-06-16 DIAGNOSIS — H3562 Retinal hemorrhage, left eye: Secondary | ICD-10-CM | POA: Diagnosis not present

## 2024-06-20 DIAGNOSIS — H359 Unspecified retinal disorder: Secondary | ICD-10-CM | POA: Diagnosis not present

## 2024-06-20 DIAGNOSIS — E113413 Type 2 diabetes mellitus with severe nonproliferative diabetic retinopathy with macular edema, bilateral: Secondary | ICD-10-CM | POA: Diagnosis not present

## 2024-06-20 DIAGNOSIS — H3562 Retinal hemorrhage, left eye: Secondary | ICD-10-CM | POA: Diagnosis not present

## 2024-06-20 DIAGNOSIS — H35372 Puckering of macula, left eye: Secondary | ICD-10-CM | POA: Diagnosis not present

## 2024-06-20 LAB — HM DIABETES EYE EXAM

## 2024-06-21 ENCOUNTER — Ambulatory Visit: Admitting: Podiatry

## 2024-06-27 ENCOUNTER — Ambulatory Visit: Admitting: Podiatry

## 2024-06-28 ENCOUNTER — Ambulatory Visit: Admitting: Emergency Medicine

## 2024-06-30 ENCOUNTER — Encounter: Payer: Self-pay | Admitting: Emergency Medicine

## 2024-06-30 ENCOUNTER — Ambulatory Visit: Payer: Self-pay | Admitting: Emergency Medicine

## 2024-06-30 ENCOUNTER — Ambulatory Visit (INDEPENDENT_AMBULATORY_CARE_PROVIDER_SITE_OTHER): Admitting: Emergency Medicine

## 2024-06-30 VITALS — BP 120/76 | HR 57 | Temp 98.4°F | Ht 72.0 in | Wt 221.0 lb

## 2024-06-30 DIAGNOSIS — N1831 Chronic kidney disease, stage 3a: Secondary | ICD-10-CM | POA: Diagnosis not present

## 2024-06-30 DIAGNOSIS — N5201 Erectile dysfunction due to arterial insufficiency: Secondary | ICD-10-CM | POA: Diagnosis not present

## 2024-06-30 DIAGNOSIS — E1122 Type 2 diabetes mellitus with diabetic chronic kidney disease: Secondary | ICD-10-CM | POA: Diagnosis not present

## 2024-06-30 DIAGNOSIS — E1169 Type 2 diabetes mellitus with other specified complication: Secondary | ICD-10-CM

## 2024-06-30 DIAGNOSIS — E113412 Type 2 diabetes mellitus with severe nonproliferative diabetic retinopathy with macular edema, left eye: Secondary | ICD-10-CM | POA: Diagnosis not present

## 2024-06-30 DIAGNOSIS — N1832 Chronic kidney disease, stage 3b: Secondary | ICD-10-CM | POA: Diagnosis not present

## 2024-06-30 DIAGNOSIS — Z7984 Long term (current) use of oral hypoglycemic drugs: Secondary | ICD-10-CM

## 2024-06-30 LAB — CBC WITH DIFFERENTIAL/PLATELET
Basophils Absolute: 0 K/uL (ref 0.0–0.1)
Basophils Relative: 0.5 % (ref 0.0–3.0)
Eosinophils Absolute: 0.1 K/uL (ref 0.0–0.7)
Eosinophils Relative: 2 % (ref 0.0–5.0)
HCT: 40.4 % (ref 39.0–52.0)
Hemoglobin: 13.2 g/dL (ref 13.0–17.0)
Lymphocytes Relative: 29.6 % (ref 12.0–46.0)
Lymphs Abs: 1.4 K/uL (ref 0.7–4.0)
MCHC: 32.8 g/dL (ref 30.0–36.0)
MCV: 90.4 fl (ref 78.0–100.0)
Monocytes Absolute: 0.3 K/uL (ref 0.1–1.0)
Monocytes Relative: 6.1 % (ref 3.0–12.0)
Neutro Abs: 3 K/uL (ref 1.4–7.7)
Neutrophils Relative %: 61.8 % (ref 43.0–77.0)
Platelets: 224 K/uL (ref 150.0–400.0)
RBC: 4.47 Mil/uL (ref 4.22–5.81)
RDW: 15.4 % (ref 11.5–15.5)
WBC: 4.8 K/uL (ref 4.0–10.5)

## 2024-06-30 LAB — COMPREHENSIVE METABOLIC PANEL WITH GFR
ALT: 13 U/L (ref 0–53)
AST: 20 U/L (ref 0–37)
Albumin: 3.8 g/dL (ref 3.5–5.2)
Alkaline Phosphatase: 75 U/L (ref 39–117)
BUN: 22 mg/dL (ref 6–23)
CO2: 24 meq/L (ref 19–32)
Calcium: 9.3 mg/dL (ref 8.4–10.5)
Chloride: 107 meq/L (ref 96–112)
Creatinine, Ser: 1.59 mg/dL — ABNORMAL HIGH (ref 0.40–1.50)
GFR: 40.98 mL/min — ABNORMAL LOW (ref >60.00)
Glucose, Bld: 159 mg/dL — ABNORMAL HIGH (ref 70–99)
Potassium: 4.2 meq/L (ref 3.5–5.1)
Sodium: 138 meq/L (ref 135–145)
Total Bilirubin: 0.4 mg/dL (ref 0.2–1.2)
Total Protein: 7 g/dL (ref 6.0–8.3)

## 2024-06-30 LAB — MICROALBUMIN / CREATININE URINE RATIO
Creatinine,U: 135.1 mg/dL
Microalb Creat Ratio: 359.6 mg/g — ABNORMAL HIGH (ref 0.0–30.0)
Microalb, Ur: 48.6 mg/dL — ABNORMAL HIGH (ref 0.0–1.9)

## 2024-06-30 LAB — LIPID PANEL
Cholesterol: 203 mg/dL — ABNORMAL HIGH (ref 0–200)
HDL: 75.9 mg/dL (ref >39.00)
LDL Cholesterol: 115 mg/dL — ABNORMAL HIGH (ref 0–99)
NonHDL: 126.99
Total CHOL/HDL Ratio: 3
Triglycerides: 62 mg/dL (ref 0.0–149.0)
VLDL: 12.4 mg/dL (ref 0.0–40.0)

## 2024-06-30 MED ORDER — TADALAFIL 20 MG PO TABS
10.0000 mg | ORAL_TABLET | ORAL | 11 refills | Status: AC | PRN
Start: 2024-06-30 — End: ?

## 2024-06-30 MED ORDER — EMPAGLIFLOZIN 10 MG PO TABS: 10.0000 mg | ORAL_TABLET | Freq: Every day | ORAL | 3 refills | Status: AC

## 2024-06-30 NOTE — Patient Instructions (Signed)
 Health Maintenance After Age 80 After age 27, you are at a higher risk for certain long-term diseases and infections as well as injuries from falls. Falls are a major cause of broken bones and head injuries in people who are older than age 73. Getting regular preventive care can help to keep you healthy and well. Preventive care includes getting regular testing and making lifestyle changes as recommended by your health care provider. Talk with your health care provider about: Which screenings and tests you should have. A screening is a test that checks for a disease when you have no symptoms. A diet and exercise plan that is right for you. What should I know about screenings and tests to prevent falls? Screening and testing are the best ways to find a health problem early. Early diagnosis and treatment give you the best chance of managing medical conditions that are common after age 90. Certain conditions and lifestyle choices may make you more likely to have a fall. Your health care provider may recommend: Regular vision checks. Poor vision and conditions such as cataracts can make you more likely to have a fall. If you wear glasses, make sure to get your prescription updated if your vision changes. Medicine review. Work with your health care provider to regularly review all of the medicines you are taking, including over-the-counter medicines. Ask your health care provider about any side effects that may make you more likely to have a fall. Tell your health care provider if any medicines that you take make you feel dizzy or sleepy. Strength and balance checks. Your health care provider may recommend certain tests to check your strength and balance while standing, walking, or changing positions. Foot health exam. Foot pain and numbness, as well as not wearing proper footwear, can make you more likely to have a fall. Screenings, including: Osteoporosis screening. Osteoporosis is a condition that causes  the bones to get weaker and break more easily. Blood pressure screening. Blood pressure changes and medicines to control blood pressure can make you feel dizzy. Depression screening. You may be more likely to have a fall if you have a fear of falling, feel depressed, or feel unable to do activities that you used to do. Alcohol  use screening. Using too much alcohol  can affect your balance and may make you more likely to have a fall. Follow these instructions at home: Lifestyle Do not drink alcohol  if: Your health care provider tells you not to drink. If you drink alcohol : Limit how much you have to: 0-1 drink a day for women. 0-2 drinks a day for men. Know how much alcohol  is in your drink. In the [place] one drink equals one 12 oz bottle of beer (355 mL), one 5 oz glass of wine (148 mL), or one 1 oz glass of hard liquor (44 mL). Do not use any products that contain nicotine or tobacco. These products include cigarettes, chewing tobacco, and vaping devices, such as e-cigarettes. If you need help quitting, ask your health care provider. Activity  Follow a regular exercise program to stay fit. This will help you maintain your balance. Ask your health care provider what types of exercise are appropriate for you. If you need a cane or walker, use it as recommended by your health care provider. Wear supportive shoes that have nonskid soles. Safety  Remove any tripping hazards, such as rugs, cords, and clutter. Install safety equipment such as grab bars in bathrooms and safety rails on stairs. Keep rooms and walkways  well-lit. General instructions Talk with your health care provider about your risks for falling. Tell your health care provider if: You fall. Be sure to tell your health care provider about all falls, even ones that seem minor. You feel dizzy, tiredness (fatigue), or off-balance. Take over-the-counter and prescription medicines only as told by your health care provider. These include  supplements. Eat a healthy diet and maintain a healthy weight. A healthy diet includes low-fat dairy products, low-fat (lean) meats, and fiber from whole grains, beans, and lots of fruits and vegetables. Stay current with your vaccines. Schedule regular health, dental, and eye exams. Summary Having a healthy lifestyle and getting preventive care can help to protect your health and wellness after age 15. Screening and testing are the best way to find a health problem early and help you avoid having a fall. Early diagnosis and treatment give you the best chance for managing medical conditions that are more common for people who are older than age 42. Falls are a major cause of broken bones and head injuries in people who are older than age 64. Take precautions to prevent a fall at home. Work with your health care provider to learn what changes you can make to improve your health and wellness and to prevent falls. This information is not intended to replace advice given to you by your health care provider. Make sure you discuss any questions you have with your health care provider. Document Revised: 03/25/2021 Document Reviewed: 03/25/2021 Elsevier Patient Education  2024 [employer].

## 2024-06-30 NOTE — Assessment & Plan Note (Signed)
Stable.  Follows up with ophthalmologist on a regular basis.

## 2024-06-30 NOTE — Assessment & Plan Note (Signed)
 Hemoglobin A1c is 6.9 Chronic kidney disease Will benefit from Jardiance  10 mg daily Cardiovascular risks associated with diabetes discussed Diet and nutrition discussed Blood work and urine done today. Follow-up in 6 months

## 2024-06-30 NOTE — Assessment & Plan Note (Signed)
 Advised to stay well-hydrated and avoid NSAIDs Recommend to start Jardiance  10 mg daily Blood work done today Follow-up in 6 months

## 2024-06-30 NOTE — Progress Notes (Signed)
 Steven Knapp 78 y.o.   Chief Complaint  Patient presents with   Follow-up    Patient here for 6 month f/u for DM. Patient wants to over last lab results.     HISTORY OF PRESENT ILLNESS: This is a 78 y.o. male here for 41-month follow-up of chronic medical conditions Overall doing well.  Has no complaints or medical concerns today. Wants to try tadalafil .  Sildenafil  not working  HPI   Prior to Admission medications   Medication Sig Start Date End Date Taking? Authorizing Provider  allopurinol  (ZYLOPRIM ) 300 MG tablet TAKE ONE AND ONE-HALF TABLETS BY MOUTH DAILY 10/07/23  Yes Deveshwar, Maya, MD  BEPREVE 1.5 % SOLN 1 drop 2 (two) times daily. 02/29/20  Yes [provider]  colchicine  0.6 MG tablet TAKE ONE TABLET BY MOUTH DAILY AS NEEDED 10/07/23  Yes Deveshwar, Maya, MD  Continuous Blood Gluc Sensor (FREESTYLE LIBRE SENSOR SYSTEM) MISC 1 Device by Does not apply route daily. 03/07/21  Yes Noemi Bellissimo Jose, MD  Cyanocobalamin (VITAMIN B-12 PO) Take by mouth daily.   Yes [provider]  glipiZIDE  (GLUCOTROL ) 5 MG tablet TAKE ONE TABLET BY MOUTH TWICE DAILY BEFORE MEALS 11/10/23  Yes Davier Tramell, Emil Schanz, MD  Multiple Vitamins-Minerals (ZINC PO) Take by mouth daily.   Yes [provider]  sildenafil  (VIAGRA ) 100 MG tablet Take one-half to one tablet by mouth daily as needed for erectile dysfunction 04/02/24  Yes Allee Busk, Emil Schanz, MD  Arginine 500 MG CAPS Take by mouth as needed.    [provider]  blood glucose meter kit and supplies Dispense based on patient and insurance preference. Use up to four times daily as directed. (FOR ICD-10 E10.9, E11.9). 02/07/21   Lacye Mccarn Jose, MD  clobetasol cream (TEMOVATE) 0.05 % Apply 1 application  topically 2 (two) times daily. Patient not taking: Reported on 06/03/2024    [provider]  Dupilumab (DUPIXENT Belle) Inject into the skin.    [provider]  Finerenone  (KERENDIA ) 10  MG TABS Take 1 tablet (10 mg total) by mouth daily. 06/24/23   Lydon Vansickle Jose, MD  ibuprofen (ADVIL) 200 MG tablet Take 200 mg by mouth every 6 (six) hours as needed for moderate pain.    [provider]  rosuvastatin  (CRESTOR ) 10 MG tablet Take 1 tablet (10 mg total) by mouth daily. Patient not taking: Reported on 06/03/2024 07/30/20   Purcell Emil Schanz, MD  SYSTANE ULTRA 0.4-0.3 % SOLN SMARTSIG:1 Drop(s) In [facility]) As Needed 11/23/19   [provider]    Allergies  Allergen Reactions   Other Hives and Itching    strawberries, wine, and corn   Shellfish Allergy     Patient Active Problem List   Diagnosis Date Noted   Sebaceous cyst 12/28/2023   Primary osteoarthritis of both shoulders 05/24/2021   Primary osteoarthritis of both hands 05/24/2021   Primary osteoarthritis of both feet 05/24/2021   DDD (degenerative disc disease), cervical 05/24/2021   Prurigo nodularis 05/24/2021   Chronic renal impairment, stage 3a (HCC) 05/24/2021   Uncontrolled type 2 diabetes mellitus with hyperglycemia (HCC) 07/30/2020   Severe nonproliferative diabetic retinopathy of left eye, with macular edema, associated with type 2 diabetes mellitus (HCC) 02/21/2020   Severe nonproliferative diabetic retinopathy of right eye, with macular edema, associated with type 2 diabetes mellitus (HCC) 02/21/2020   Nuclear sclerotic cataract of both eyes 02/21/2020   Retinal hemorrhage of left eye 02/21/2020   Retinal exudates and deposits 02/21/2020  Alternating exotropia 02/21/2020   Diabetes mellitus (HCC) 10/06/2019   Spinal stenosis 10/06/2019   History of gout 10/06/2019   History of rheumatoid arthritis 10/06/2019   Osteoarthritis of wrist 11/05/2018   Gout 09/16/2018    Past Medical History:  Diagnosis Date   Arthritis    Cataract    Diabetes mellitus without complication (HCC)    Spinal stenosis    per patient    Past Surgical History:  Procedure Laterality Date   CATARACT  EXTRACTION, BILATERAL     04/2023, 05/2023    Social History   Socioeconomic History   Marital status: Single    Spouse name: Not on file   Number of children: Not on file   Years of education: Not on file   Highest education level: Master's degree (e.g., MA, MS, MEng, MEd, MSW, MBA)  Occupational History   Not on file  Tobacco Use   Smoking status: Former    Current packs/day: 0.00    Average packs/day: 1 pack/day for 51.0 years (51.0 ttl pk-yrs)    Types: Cigarettes    Start date: 11/17/1970    Quit date: 11/17/2021    Years since quitting: 2.6    Passive exposure: Past   Smokeless tobacco: Never  Vaping Use   Vaping status: Never Used  Substance and Sexual Activity   Alcohol use: Yes    Alcohol/week: 1.0 standard drink of alcohol    Types: 1 Glasses of wine per week    Comment: occ   Drug use: Never   Sexual activity: Yes  Other Topics Concern   Not on file  Social History Narrative   Single   Social Drivers of Health   Financial Resource Strain: High Risk (06/29/2024)   Overall Financial Resource Strain (CARDIA)    Difficulty of Paying Living Expenses: Hard  Food Insecurity: Food Insecurity Present (06/29/2024)   Hunger Vital Sign    Worried About Running Out of Food in the Last Year: Sometimes true    Ran Out of Food in the Last Year: Sometimes true  Transportation Needs: No Transportation Needs (06/29/2024)   PRAPARE - Administrator, Civil Service (Medical): No    Lack of Transportation (Non-Medical): No  Physical Activity: Sufficiently Active (06/29/2024)   Exercise Vital Sign    Days of Exercise per Week: 3 days    Minutes of Exercise per Session: 60 min  Stress: No Stress Concern Present (06/29/2024)   [employer] of Occupational Health - Occupational Stress Questionnaire    Feeling of Stress: Not at all  Social Connections: Moderately Integrated (06/29/2024)   Social Connection and Isolation Panel    Frequency of Communication with Friends  and Family: More than three times a week    Frequency of Social Gatherings with Friends and Family: Twice a week    Attends Religious Services: More than 4 times per year    Active Member of [employer] or Organizations: Yes    Attends Banker Meetings: Not on file    Marital Status: Divorced  Intimate Partner Violence: Not At Risk (06/03/2024)   Humiliation, Afraid, Rape, and Kick questionnaire    Fear of Current or Ex-Partner: No    Emotionally Abused: No    Physically Abused: No    Sexually Abused: No    Family History  Problem Relation Age of Onset   Diabetes Mother    Hypertension Mother    Diabetes Brother    Healthy Brother    Healthy  Daughter    Healthy Daughter    Healthy Son    Healthy Son    Scoliosis Son      Review of Systems  Constitutional: Negative.  Negative for chills and fever.  HENT: Negative.  Negative for congestion and sore throat.   Respiratory: Negative.  Negative for cough and shortness of breath.   Cardiovascular: Negative.  Negative for chest pain and palpitations.  Gastrointestinal:  Negative for abdominal pain, diarrhea, nausea and vomiting.  Genitourinary: Negative.  Negative for dysuria and hematuria.  Skin: Negative.  Negative for rash.  Neurological: Negative.  Negative for dizziness and headaches.  All other systems reviewed and are negative.   Vitals:   06/30/24 0816  BP: 120/76  Pulse: (!) 57  Temp: 98.4 F (36.9 C)  SpO2: 98%    Physical Exam Vitals reviewed.  Constitutional:      Appearance: Normal appearance.  HENT:     Head: Normocephalic.     Mouth/Throat:     Mouth: Mucous membranes are moist.     Pharynx: Oropharynx is clear.  Eyes:     Extraocular Movements: Extraocular movements intact.     Pupils: Pupils are equal, round, and reactive to light.  Cardiovascular:     Rate and Rhythm: Normal rate and regular rhythm.     Heart sounds: Murmur heard.  Pulmonary:     Effort: Pulmonary effort is normal.      Breath sounds: Normal breath sounds.  Abdominal:     Palpations: Abdomen is soft.     Tenderness: There is no abdominal tenderness.  Musculoskeletal:     Cervical back: No tenderness.  Lymphadenopathy:     Cervical: No cervical adenopathy.  Skin:    General: Skin is warm and dry.     Capillary Refill: Capillary refill takes less than 2 seconds.  Neurological:     Mental Status: He is alert and oriented to person, place, and time.  Psychiatric:        Mood and Affect: Mood normal.        Behavior: Behavior normal.    Lab Results  Component Value Date   HGBA1C 6.9 (H) 06/08/2024     ASSESSMENT & PLAN: A total of 45 minutes was spent with the patient and counseling/coordination of care regarding preparing for this visit, review of most recent office visit notes, review of multiple chronic medical conditions and their management, cardiovascular risks associated with diabetes, review of all medications and changes made, review of most recent bloodwork results including interpretation of most recent hemoglobin A1c result, review of health maintenance items, education on nutrition, prognosis, documentation, and need for follow up.   Problem List Items Addressed This Visit       Cardiovascular and Mediastinum   Erectile dysfunction due to arterial insufficiency   Relevant Medications   tadalafil  (CIALIS ) 20 MG tablet     Endocrine   Diabetes mellitus (HCC) - Primary   Hemoglobin A1c is 6.9 Chronic kidney disease Will benefit from Jardiance  10 mg daily Cardiovascular risks associated with diabetes discussed Diet and nutrition discussed Blood work and urine done today. Follow-up in 6 months      Relevant Medications   empagliflozin  (JARDIANCE ) 10 MG TABS tablet   Other Relevant Orders   Microalbumin / creatinine urine ratio   Comprehensive metabolic panel with GFR   CBC with Differential/Platelet   Lipid panel   Severe nonproliferative diabetic retinopathy of left eye,  with macular edema, associated with type 2  diabetes mellitus (HCC)   Stable.  Follows up with ophthalmologist on a regular basis.        Relevant Medications   empagliflozin  (JARDIANCE ) 10 MG TABS tablet   Uncontrolled type 2 diabetes mellitus with hyperglycemia (HCC)   Relevant Medications   empagliflozin  (JARDIANCE ) 10 MG TABS tablet     Genitourinary   Chronic renal impairment, stage 3a (HCC)   Advised to stay well-hydrated and avoid NSAIDs Recommend to start Jardiance  10 mg daily Blood work done today Follow-up in 6 months      Relevant Medications   empagliflozin  (JARDIANCE ) 10 MG TABS tablet   Other Relevant Orders   Microalbumin / creatinine urine ratio   Comprehensive metabolic panel with GFR   CBC with Differential/Platelet   Lipid panel   Patient Instructions  Health Maintenance After Age 20 After age 105, you are at a higher risk for certain long-term diseases and infections as well as injuries from falls. Falls are a major cause of broken bones and head injuries in people who are older than age 75. Getting regular preventive care can help to keep you healthy and well. Preventive care includes getting regular testing and making lifestyle changes as recommended by your health care provider. Talk with your health care provider about: Which screenings and tests you should have. A screening is a test that checks for a disease when you have no symptoms. A diet and exercise plan that is right for you. What should I know about screenings and tests to prevent falls? Screening and testing are the best ways to find a health problem early. Early diagnosis and treatment give you the best chance of managing medical conditions that are common after age 68. Certain conditions and lifestyle choices may make you more likely to have a fall. Your health care provider may recommend: Regular vision checks. Poor vision and conditions such as cataracts can make you more likely to have a fall. If  you wear glasses, make sure to get your prescription updated if your vision changes. Medicine review. Work with your health care provider to regularly review all of the medicines you are taking, including over-the-counter medicines. Ask your health care provider about any side effects that may make you more likely to have a fall. Tell your health care provider if any medicines that you take make you feel dizzy or sleepy. Strength and balance checks. Your health care provider may recommend certain tests to check your strength and balance while standing, walking, or changing positions. Foot health exam. Foot pain and numbness, as well as not wearing proper footwear, can make you more likely to have a fall. Screenings, including: Osteoporosis screening. Osteoporosis is a condition that causes the bones to get weaker and break more easily. Blood pressure screening. Blood pressure changes and medicines to control blood pressure can make you feel dizzy. Depression screening. You may be more likely to have a fall if you have a fear of falling, feel depressed, or feel unable to do activities that you used to do. Alcohol use screening. Using too much alcohol can affect your balance and may make you more likely to have a fall. Follow these instructions at home: Lifestyle Do not drink alcohol if: Your health care provider tells you not to drink. If you drink alcohol: Limit how much you have to: 0-1 drink a day for women. 0-2 drinks a day for men. Know how much alcohol is in your drink. In the [place] one drink equals one 12  oz bottle of beer (355 mL), one 5 oz glass of wine (148 mL), or one 1 oz glass of hard liquor (44 mL). Do not use any products that contain nicotine or tobacco. These products include cigarettes, chewing tobacco, and vaping devices, such as e-cigarettes. If you need help quitting, ask your health care provider. Activity  Follow a regular exercise program to stay fit. This will help you  maintain your balance. Ask your health care provider what types of exercise are appropriate for you. If you need a cane or walker, use it as recommended by your health care provider. Wear supportive shoes that have nonskid soles. Safety  Remove any tripping hazards, such as rugs, cords, and clutter. Install safety equipment such as grab bars in bathrooms and safety rails on stairs. Keep rooms and walkways well-lit. General instructions Talk with your health care provider about your risks for falling. Tell your health care provider if: You fall. Be sure to tell your health care provider about all falls, even ones that seem minor. You feel dizzy, tiredness (fatigue), or off-balance. Take over-the-counter and prescription medicines only as told by your health care provider. These include supplements. Eat a healthy diet and maintain a healthy weight. A healthy diet includes low-fat dairy products, low-fat (lean) meats, and fiber from whole grains, beans, and lots of fruits and vegetables. Stay current with your vaccines. Schedule regular health, dental, and eye exams. Summary Having a healthy lifestyle and getting preventive care can help to protect your health and wellness after age 103. Screening and testing are the best way to find a health problem early and help you avoid having a fall. Early diagnosis and treatment give you the best chance for managing medical conditions that are more common for people who are older than age 44. Falls are a major cause of broken bones and head injuries in people who are older than age 13. Take precautions to prevent a fall at home. Work with your health care provider to learn what changes you can make to improve your health and wellness and to prevent falls. This information is not intended to replace advice given to you by your health care provider. Make sure you discuss any questions you have with your health care provider. Document Revised: 03/25/2021  Document Reviewed: 03/25/2021 Elsevier Patient Education  2024 Elsevier Inc.        Emil Schaumann, MD [facility]

## 2024-07-04 ENCOUNTER — Ambulatory Visit (INDEPENDENT_AMBULATORY_CARE_PROVIDER_SITE_OTHER): Admitting: Podiatry

## 2024-07-04 ENCOUNTER — Encounter: Payer: Self-pay | Admitting: Podiatry

## 2024-07-04 DIAGNOSIS — M79675 Pain in left toe(s): Secondary | ICD-10-CM

## 2024-07-04 DIAGNOSIS — B351 Tinea unguium: Secondary | ICD-10-CM

## 2024-07-04 DIAGNOSIS — M79674 Pain in right toe(s): Secondary | ICD-10-CM

## 2024-07-04 DIAGNOSIS — E1165 Type 2 diabetes mellitus with hyperglycemia: Secondary | ICD-10-CM | POA: Diagnosis not present

## 2024-07-04 DIAGNOSIS — L84 Corns and callosities: Secondary | ICD-10-CM

## 2024-07-04 DIAGNOSIS — E1142 Type 2 diabetes mellitus with diabetic polyneuropathy: Secondary | ICD-10-CM | POA: Diagnosis not present

## 2024-07-04 NOTE — Progress Notes (Signed)
  Subjective:  Patient ID: Steven Knapp, male    DOB: September 22, 1946,   MRN: [id number]  Chief Complaint  Patient presents with   Diabetes    I was sent here by my primary.  I'm Diabetic.    78 y.o. male presents for diabetic foot check and calluses also of thickened elongated and painful nails that are difficult to trim. Requesting to have them trimmed today. Relates burning and tingling in their feet. Patient is diabetic and last A1c was  Lab Results  Component Value Date   HGBA1C 6.9 (H) 06/08/2024   .   PCP:  Purcell Emil Schanz, MD    . Denies any other pedal complaints. Denies n/v/f/c.   Past Medical History:  Diagnosis Date   Arthritis    Cataract    Diabetes mellitus without complication (HCC)    Spinal stenosis    per patient    Objective:  Physical Exam: Vascular: DP/PT pulses 2/4 bilateral. CFT <3 seconds. Absent hair growth on digits. Edema noted to bilateral lower extremities. Xerosis noted bilaterally.  Skin. No lacerations or abrasions bilateral feet. Nails 1-5 bilateral  are thickened discolored and elongated with subungual debris. Hyperkeratotic lesions noted sub third and fifth metatarsal heads bilateral.  Musculoskeletal: MMT 5/5 bilateral lower extremities in DF, PF, Inversion and Eversion. Deceased ROM in DF of ankle joint.  Neurological: Sensation intact to light touch. Protective sensation diminished bilateral.    Assessment:   1. Pain due to onychomycosis of toenails of both feet   2. Type 2 diabetes mellitus with peripheral neuropathy (HCC)   3. Callus   4. Uncontrolled type 2 diabetes mellitus with hyperglycemia (HCC) [E11.65]      Plan:  Patient was evaluated and treated and all questions answered. -Discussed and educated patient on diabetic foot care, especially with  regards to the vascular, neurological and musculoskeletal systems.  -Stressed the importance of good glycemic control and the detriment of not  controlling glucose levels in  relation to the foot. -Discussed supportive shoes at all times and checking feet regularly.  -Mechanically debrided all nails 1-5 bilateral using sterile nail nipper and filed with dremel without incident  -Hyperkeratotic tissue noted sub third and fifth metatarsal heads bilateral debrided with chisel without incident.  -Answered all patient questions -Patient to return  in 3 months for at risk foot care -Patient advised to call the office if any problems or questions arise in the meantime.   Asberry Failing, DPM

## 2024-07-12 NOTE — Progress Notes (Deleted)
 Office Visit Note  Patient: Steven Knapp             Date of Birth: 11/28/1945           MRN: [id number]             PCP: Purcell Emil Schanz, MD Referring: Purcell Emil Schanz, * Visit Date: 07/26/2024 Occupation: @GUAROCC @  Subjective:  No chief complaint on file.   History of Present Illness: Steven Knapp is a 78 y.o. male ***     Activities of Daily Living:  Patient reports morning stiffness for *** {minute/hour:19697}.   Patient {ACTIONS;DENIES/REPORTS:21021675::Denies} nocturnal pain.  Difficulty dressing/grooming: {ACTIONS;DENIES/REPORTS:21021675::Denies} Difficulty climbing stairs: {ACTIONS;DENIES/REPORTS:21021675::Denies} Difficulty getting out of chair: {ACTIONS;DENIES/REPORTS:21021675::Denies} Difficulty using hands for taps, buttons, cutlery, and/or writing: {ACTIONS;DENIES/REPORTS:21021675::Denies}  No Rheumatology ROS completed.   PMFS History:  Patient Active Problem List   Diagnosis Date Noted  . Erectile dysfunction due to arterial insufficiency 06/30/2024  . Sebaceous cyst 12/28/2023  . Primary osteoarthritis of both shoulders 05/24/2021  . Primary osteoarthritis of both hands 05/24/2021  . Primary osteoarthritis of both feet 05/24/2021  . DDD (degenerative disc disease), cervical 05/24/2021  . Prurigo nodularis 05/24/2021  . Chronic renal impairment, stage 3a (HCC) 05/24/2021  . Uncontrolled type 2 diabetes mellitus with hyperglycemia (HCC) 07/30/2020  . Severe nonproliferative diabetic retinopathy of left eye, with macular edema, associated with type 2 diabetes mellitus (HCC) 02/21/2020  . Severe nonproliferative diabetic retinopathy of right eye, with macular edema, associated with type 2 diabetes mellitus (HCC) 02/21/2020  . Nuclear sclerotic cataract of both eyes 02/21/2020  . Retinal hemorrhage of left eye 02/21/2020  . Retinal exudates and deposits 02/21/2020  . Alternating exotropia 02/21/2020  . Diabetes mellitus (HCC) 10/06/2019   . Spinal stenosis 10/06/2019  . History of gout 10/06/2019  . History of rheumatoid arthritis 10/06/2019  . 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 Gout 09/16/2018    Past Medical History:  Diagnosis Date  . Arthritis   . Cataract   . Diabetes mellitus without complication (HCC)   . Spinal stenosis    per patient    Family History  Problem Relation Age of Onset  . Diabetes Mother   . Hypertension Mother   . Diabetes Brother   . Healthy Brother   . Healthy Daughter   . Healthy Daughter   . Healthy Son   . Healthy Son   . Scoliosis Son    Past Surgical History:  Procedure Laterality Date  . CATARACT EXTRACTION, BILATERAL     04/2023, 05/2023   Social History   Social History Narrative   Single   Immunization History  Administered Date(s) Administered  . Fluad Quad(high Dose 65+) 07/30/2020, 08/01/2022  . Moderna Sars-Covid-2 Vaccination 11/28/2019, 12/29/2019, 07/18/2020  . PNEUMOCOCCAL CONJUGATE-20 05/06/2022  . Pneumococcal Polysaccharide-23 10/06/2019  . Zoster Recombinant(Shingrix ) 05/06/2022     Objective: Vital Signs: There were no vitals taken for this visit.   Physical Exam   Musculoskeletal Exam: ***  CDAI Exam: CDAI Score: -- Patient Global: --; Provider Global: -- Swollen: --; Tender: -- Joint Exam 07/26/2024   No joint exam has been documented for this visit   There is currently no information documented on the homunculus. Go to the Rheumatology activity and complete the homunculus joint exam.  Investigation: No additional findings.  Imaging: No results found.  Recent Labs: Lab Results  Component Value Date   WBC 4.8 06/30/2024   HGB 13.2 06/30/2024   PLT 224.0 06/30/2024   NA 138  06/30/2024   K 4.2 06/30/2024   CL 107 06/30/2024   CO2 24 06/30/2024   GLUCOSE 159 (H) 06/30/2024   BUN 22 06/30/2024   CREATININE 1.59 (H) 06/30/2024   BILITOT 0.4 06/30/2024   ALKPHOS 75 06/30/2024   AST 20 06/30/2024   ALT 13 06/30/2024   PROT  7.0 06/30/2024   ALBUMIN 3.8 06/30/2024   CALCIUM  9.3 06/30/2024   GFRAA 45 (L) 04/30/2021    Speciality Comments: Allopurinol -nausea  Procedures:  No procedures performed Allergies: Other and Shellfish allergy   Assessment / Plan:     Visit Diagnoses: No diagnosis found.  Orders: No orders of the defined types were placed in this encounter.  No orders of the defined types were placed in this encounter.   Face-to-face time spent with patient was *** minutes. Greater than 50% of time was spent in counseling and coordination of care.  Follow-Up Instructions: No follow-ups on file.   Steven Knapp, CMA  Note - This record has been created using Animal nutritionist.  Chart creation errors have been sought, but may not always  have been located. Such creation errors do not reflect on  the standard of medical care.

## 2024-07-22 ENCOUNTER — Telehealth: Payer: Self-pay | Admitting: Radiology

## 2024-07-22 NOTE — Telephone Encounter (Signed)
 Copied from CRM #[id number]. Topic: Clinical - Medication Question >> Jul 22, 2024 12:49 PM Paige D wrote: Reason for CRM: pt wanting an order sent to [facility] for a maderna covid vaccine CAL stated they are not doing that at this time as they are still figuring things out and every one was on lunch as well. >> Jul 22, 2024  1:50 PM Robinson H wrote: Patient calling to have prescription sent over to the [facility] pharmacy on file so that he can have a COVID vaccine done.  Elai [phone]

## 2024-07-22 NOTE — Telephone Encounter (Signed)
 Copied from CRM #[id number]. Topic: Clinical - Medication Question >> Jul 22, 2024 12:49 PM Paige D wrote: Reason for CRM: pt wanting an order sent to [facility] for a maderna covid vaccine CAL stated they are not doing that at this time as they are still figuring things out and every one was on lunch as well. >> Jul 22, 2024  1:50 PM Robinson H wrote: Patient calling to have prescription sent over to the [facility] pharmacy on file so that he can have a COVID vaccine done.  Steven Knapp [phone]

## 2024-07-26 ENCOUNTER — Ambulatory Visit: Admitting: Rheumatology

## 2024-07-26 ENCOUNTER — Other Ambulatory Visit: Payer: Self-pay | Admitting: Radiology

## 2024-07-26 ENCOUNTER — Telehealth: Payer: Self-pay | Admitting: Radiology

## 2024-07-26 DIAGNOSIS — M720 Palmar fascial fibromatosis [Dupuytren]: Secondary | ICD-10-CM

## 2024-07-26 DIAGNOSIS — M1A09X0 Idiopathic chronic gout, multiple sites, without tophus (tophi): Secondary | ICD-10-CM

## 2024-07-26 DIAGNOSIS — M170 Bilateral primary osteoarthritis of knee: Secondary | ICD-10-CM

## 2024-07-26 DIAGNOSIS — Z23 Encounter for immunization: Secondary | ICD-10-CM

## 2024-07-26 DIAGNOSIS — E1165 Type 2 diabetes mellitus with hyperglycemia: Secondary | ICD-10-CM

## 2024-07-26 DIAGNOSIS — N1831 Chronic kidney disease, stage 3a: Secondary | ICD-10-CM

## 2024-07-26 DIAGNOSIS — R768 Other specified abnormal immunological findings in serum: Secondary | ICD-10-CM

## 2024-07-26 DIAGNOSIS — Z789 Other specified health status: Secondary | ICD-10-CM

## 2024-07-26 DIAGNOSIS — M19071 Primary osteoarthritis, right ankle and foot: Secondary | ICD-10-CM

## 2024-07-26 DIAGNOSIS — M5030 Other cervical disc degeneration, unspecified cervical region: Secondary | ICD-10-CM

## 2024-07-26 DIAGNOSIS — Z5181 Encounter for therapeutic drug level monitoring: Secondary | ICD-10-CM

## 2024-07-26 DIAGNOSIS — M19041 Primary osteoarthritis, right hand: Secondary | ICD-10-CM

## 2024-07-26 DIAGNOSIS — L281 Prurigo nodularis: Secondary | ICD-10-CM

## 2024-07-26 DIAGNOSIS — E790 Hyperuricemia without signs of inflammatory arthritis and tophaceous disease: Secondary | ICD-10-CM

## 2024-07-26 DIAGNOSIS — M19011 Primary osteoarthritis, right shoulder: Secondary | ICD-10-CM

## 2024-07-26 DIAGNOSIS — H2513 Age-related nuclear cataract, bilateral: Secondary | ICD-10-CM

## 2024-07-26 DIAGNOSIS — E113413 Type 2 diabetes mellitus with severe nonproliferative diabetic retinopathy with macular edema, bilateral: Secondary | ICD-10-CM

## 2024-07-26 MED ORDER — COVID-19 MRNA VAC-TRIS(PFIZER) 30 MCG/0.3ML IM SUSY: 0.3000 mL | PREFILLED_SYRINGE | Freq: Once | INTRAMUSCULAR | 0 refills | Status: AC

## 2024-07-26 NOTE — Telephone Encounter (Signed)
 Spoke with patient, he clearly upset with the new Covid vaccine prescription. States will call back when he is free

## 2024-07-26 NOTE — Telephone Encounter (Signed)
 Covid vaccine prescription has been sent to [facility]

## 2024-07-26 NOTE — Progress Notes (Signed)
 co

## 2024-07-26 NOTE — Telephone Encounter (Signed)
 Copied from CRM [id number]. Topic: General - Other >> Jul 26, 2024  8:38 AM Aleatha C wrote: Reason for CRM: Patient would like for Dr Purcell to give him a call as soon  as possible he says its important but didn't want to leave details of call >> Jul 26, 2024  9:33 AM Martinique E wrote: Patient called back stating that he would like to speak with Dr. Purcell or his nurse. Refused to give agent a reason as to why, just relayed he wants to speak to his care team directly.

## 2024-08-15 DIAGNOSIS — H359 Unspecified retinal disorder: Secondary | ICD-10-CM | POA: Diagnosis not present

## 2024-08-15 DIAGNOSIS — E113411 Type 2 diabetes mellitus with severe nonproliferative diabetic retinopathy with macular edema, right eye: Secondary | ICD-10-CM | POA: Diagnosis not present

## 2024-08-15 DIAGNOSIS — H3562 Retinal hemorrhage, left eye: Secondary | ICD-10-CM | POA: Diagnosis not present

## 2024-08-15 DIAGNOSIS — E113412 Type 2 diabetes mellitus with severe nonproliferative diabetic retinopathy with macular edema, left eye: Secondary | ICD-10-CM | POA: Diagnosis not present

## 2024-08-15 DIAGNOSIS — H35372 Puckering of macula, left eye: Secondary | ICD-10-CM | POA: Diagnosis not present

## 2024-08-15 LAB — OPHTHALMOLOGY REPORT-SCANNED

## 2024-08-25 DIAGNOSIS — H359 Unspecified retinal disorder: Secondary | ICD-10-CM | POA: Diagnosis not present

## 2024-08-25 DIAGNOSIS — E113412 Type 2 diabetes mellitus with severe nonproliferative diabetic retinopathy with macular edema, left eye: Secondary | ICD-10-CM | POA: Diagnosis not present

## 2024-08-25 DIAGNOSIS — H35372 Puckering of macula, left eye: Secondary | ICD-10-CM | POA: Diagnosis not present

## 2024-08-25 DIAGNOSIS — E113411 Type 2 diabetes mellitus with severe nonproliferative diabetic retinopathy with macular edema, right eye: Secondary | ICD-10-CM | POA: Diagnosis not present

## 2024-08-25 DIAGNOSIS — H3562 Retinal hemorrhage, left eye: Secondary | ICD-10-CM | POA: Diagnosis not present

## 2024-08-25 LAB — OPHTHALMOLOGY REPORT-SCANNED

## 2024-10-03 ENCOUNTER — Encounter: Payer: Self-pay | Admitting: Podiatry

## 2024-10-03 ENCOUNTER — Ambulatory Visit: Admitting: Podiatry

## 2024-10-03 DIAGNOSIS — M79674 Pain in right toe(s): Secondary | ICD-10-CM

## 2024-10-03 DIAGNOSIS — M79675 Pain in left toe(s): Secondary | ICD-10-CM

## 2024-10-03 DIAGNOSIS — B351 Tinea unguium: Secondary | ICD-10-CM

## 2024-10-03 DIAGNOSIS — E1142 Type 2 diabetes mellitus with diabetic polyneuropathy: Secondary | ICD-10-CM | POA: Diagnosis not present

## 2024-10-03 DIAGNOSIS — L84 Corns and callosities: Secondary | ICD-10-CM

## 2024-10-03 NOTE — Progress Notes (Signed)
 This patient returns to my office for at risk foot care.  This patient requires this care by a professional since this patient will be at risk due to having diabetes and CKD.  This patient is unable to treat his callus on the outside ball of his feet  bilateral. This patient presents for at risk foot care today.  General Appearance  Alert, conversant and in no acute stress.  Vascular  Dorsalis pedis and posterior tibial  pulses are palpable  bilaterally.  Capillary return is within normal limits  bilaterally. Temperature is within normal limits  bilaterally.  Neurologic  Senn-Weinstein monofilament wire test within normal limits  bilaterally. Muscle power within normal limits bilaterally.  Nails Thick disfigured discolored nails with subungual debris  from hallux to fifth toes bilaterally. No evidence of bacterial infection or drainage bilaterally.  Orthopedic  No limitations of motion  feet .  No crepitus or effusions noted.  No bony pathology or digital deformities noted.  Skin  normotropic skin.  Porokeratosis sub 5th met  B/L. No signs of infections or ulcers noted.     Porokeratosis  B/L.  Consent was obtained for treatment procedures.   Mechanical debridement of callus sub 5th  B/L and # 15 blade. Nails were done as a courtesy.   Return office visit   3 months                   Told patient to return for periodic foot care and evaluation due to potential at risk complications.   Cordella Bold DPM

## 2024-11-15 ENCOUNTER — Ambulatory Visit: Payer: Self-pay

## 2024-11-15 ENCOUNTER — Encounter (HOSPITAL_COMMUNITY): Payer: Self-pay

## 2024-11-15 ENCOUNTER — Inpatient Hospital Stay (HOSPITAL_COMMUNITY)

## 2024-11-15 ENCOUNTER — Other Ambulatory Visit: Payer: Self-pay

## 2024-11-15 ENCOUNTER — Observation Stay (HOSPITAL_COMMUNITY): Admission: EM | Admit: 2024-11-15 | Discharge: 2024-11-17 | Disposition: A | Discharge status: Home / Self Care

## 2024-11-15 ENCOUNTER — Emergency Department (HOSPITAL_COMMUNITY)

## 2024-11-15 DIAGNOSIS — E785 Hyperlipidemia, unspecified: Secondary | ICD-10-CM | POA: Diagnosis not present

## 2024-11-15 DIAGNOSIS — N1832 Chronic kidney disease, stage 3b: Secondary | ICD-10-CM

## 2024-11-15 DIAGNOSIS — Z8739 Personal history of other diseases of the musculoskeletal system and connective tissue: Secondary | ICD-10-CM | POA: Diagnosis not present

## 2024-11-15 DIAGNOSIS — Z87891 Personal history of nicotine dependence: Secondary | ICD-10-CM | POA: Insufficient documentation

## 2024-11-15 DIAGNOSIS — M79643 Pain in unspecified hand: Secondary | ICD-10-CM | POA: Diagnosis present

## 2024-11-15 DIAGNOSIS — E1169 Type 2 diabetes mellitus with other specified complication: Secondary | ICD-10-CM | POA: Diagnosis not present

## 2024-11-15 DIAGNOSIS — N1831 Chronic kidney disease, stage 3a: Secondary | ICD-10-CM | POA: Diagnosis not present

## 2024-11-15 DIAGNOSIS — Z794 Long term (current) use of insulin: Secondary | ICD-10-CM | POA: Insufficient documentation

## 2024-11-15 DIAGNOSIS — M109 Gout, unspecified: Secondary | ICD-10-CM | POA: Diagnosis not present

## 2024-11-15 DIAGNOSIS — I10 Essential (primary) hypertension: Secondary | ICD-10-CM

## 2024-11-15 DIAGNOSIS — N2889 Other specified disorders of kidney and ureter: Secondary | ICD-10-CM | POA: Diagnosis present

## 2024-11-15 DIAGNOSIS — E1122 Type 2 diabetes mellitus with diabetic chronic kidney disease: Secondary | ICD-10-CM | POA: Diagnosis not present

## 2024-11-15 DIAGNOSIS — L03119 Cellulitis of unspecified part of limb: Secondary | ICD-10-CM | POA: Diagnosis not present

## 2024-11-15 DIAGNOSIS — I129 Hypertensive chronic kidney disease with stage 1 through stage 4 chronic kidney disease, or unspecified chronic kidney disease: Secondary | ICD-10-CM | POA: Diagnosis not present

## 2024-11-15 DIAGNOSIS — E1165 Type 2 diabetes mellitus with hyperglycemia: Secondary | ICD-10-CM

## 2024-11-15 DIAGNOSIS — E1121 Type 2 diabetes mellitus with diabetic nephropathy: Secondary | ICD-10-CM

## 2024-11-15 LAB — CBC WITH DIFFERENTIAL/PLATELET
Abs Immature Granulocytes: 0.03 K/uL (ref 0.00–0.07)
Basophils Absolute: 0 K/uL (ref 0.0–0.1)
Basophils Relative: 0 %
Eosinophils Absolute: 0.1 K/uL (ref 0.0–0.5)
Eosinophils Relative: 1 %
HCT: 43.7 % (ref 39.0–52.0)
Hemoglobin: 14.7 g/dL (ref 13.0–17.0)
Immature Granulocytes: 0 %
Lymphocytes Relative: 17 %
Lymphs Abs: 1.4 K/uL (ref 0.7–4.0)
MCH: 29.8 pg (ref 26.0–34.0)
MCHC: 33.6 g/dL (ref 30.0–36.0)
MCV: 88.6 fL (ref 80.0–100.0)
Monocytes Absolute: 0.7 K/uL (ref 0.1–1.0)
Monocytes Relative: 8 %
Neutro Abs: 5.7 K/uL (ref 1.7–7.7)
Neutrophils Relative %: 74 %
Platelets: 276 K/uL (ref 150–400)
RBC: 4.93 MIL/uL (ref 4.22–5.81)
RDW: 15.4 % (ref 11.5–15.5)
WBC: 7.8 K/uL (ref 4.0–10.5)
nRBC: 0 % (ref 0.0–0.2)

## 2024-11-15 LAB — COMPREHENSIVE METABOLIC PANEL WITH GFR
ALT: 16 U/L (ref 0–44)
AST: 29 U/L (ref 15–41)
Albumin: 3.9 g/dL (ref 3.5–5.0)
Alkaline Phosphatase: 69 U/L (ref 38–126)
Anion gap: 14 (ref 5–15)
BUN: 25 mg/dL — ABNORMAL HIGH (ref 8–23)
CO2: 19 mmol/L — ABNORMAL LOW (ref 22–32)
Calcium: 10.1 mg/dL (ref 8.9–10.3)
Chloride: 104 mmol/L (ref 98–111)
Creatinine, Ser: 1.82 mg/dL — ABNORMAL HIGH (ref 0.61–1.24)
GFR, Estimated: 37 mL/min — ABNORMAL LOW
Glucose, Bld: 155 mg/dL — ABNORMAL HIGH (ref 70–99)
Potassium: 4.3 mmol/L (ref 3.5–5.1)
Sodium: 138 mmol/L (ref 135–145)
Total Bilirubin: 0.5 mg/dL (ref 0.0–1.2)
Total Protein: 8.3 g/dL — ABNORMAL HIGH (ref 6.5–8.1)

## 2024-11-15 LAB — URIC ACID: Uric Acid, Serum: 6 mg/dL (ref 3.7–8.6)

## 2024-11-15 LAB — SEDIMENTATION RATE: Sed Rate: 77 mm/h — ABNORMAL HIGH (ref 0–16)

## 2024-11-15 LAB — C-REACTIVE PROTEIN: CRP: 8.6 mg/dL — ABNORMAL HIGH

## 2024-11-15 MED ORDER — SODIUM CHLORIDE 0.9 % IV SOLN
1.0000 g | Freq: Once | INTRAVENOUS | Status: AC
  Administered 2024-11-15: 1 g via INTRAVENOUS
  Filled 2024-11-15: qty 10

## 2024-11-15 MED ORDER — ALLOPURINOL 300 MG PO TABS
450.0000 mg | ORAL_TABLET | Freq: Every day | ORAL | Status: DC
  Administered 2024-11-15 – 2024-11-17 (×3): 450 mg via ORAL
  Filled 2024-11-15: qty 5
  Filled 2024-11-15: qty 2
  Filled 2024-11-15: qty 5

## 2024-11-15 MED ORDER — ONDANSETRON HCL 4 MG/2ML IJ SOLN: 4.0000 mg | Freq: Four times a day (QID) | INTRAMUSCULAR | Status: DC | PRN

## 2024-11-15 MED ORDER — SODIUM CHLORIDE 0.9 % IV SOLN
1.0000 g | INTRAVENOUS | Status: DC
  Administered 2024-11-15 – 2024-11-16 (×2): 1 g via INTRAVENOUS
  Filled 2024-11-15 (×2): qty 10

## 2024-11-15 MED ORDER — INSULIN ASPART 100 UNIT/ML IJ SOLN
0.0000 [IU] | Freq: Three times a day (TID) | INTRAMUSCULAR | Status: DC
  Filled 2024-11-15: qty 2

## 2024-11-15 MED ORDER — ACETAMINOPHEN 325 MG PO TABS
650.0000 mg | ORAL_TABLET | Freq: Four times a day (QID) | ORAL | Status: DC | PRN
  Filled 2024-11-15: qty 2

## 2024-11-15 MED ORDER — COLCHICINE 0.3 MG HALF TABLET
0.3000 mg | ORAL_TABLET | Freq: Two times a day (BID) | ORAL | Status: DC
  Administered 2024-11-15 – 2024-11-17 (×4): 0.3 mg via ORAL
  Filled 2024-11-15 (×6): qty 1

## 2024-11-15 MED ORDER — ONDANSETRON HCL 4 MG PO TABS: 4.0000 mg | ORAL_TABLET | Freq: Four times a day (QID) | ORAL | Status: DC | PRN

## 2024-11-15 MED ORDER — VANCOMYCIN HCL IN DEXTROSE 1-5 GM/200ML-% IV SOLN
1000.0000 mg | INTRAVENOUS | Status: DC
  Administered 2024-11-16: 1000 mg via INTRAVENOUS
  Filled 2024-11-15: qty 200

## 2024-11-15 MED ORDER — OXYCODONE HCL 5 MG PO TABS: 5.0000 mg | ORAL_TABLET | ORAL | Status: DC | PRN

## 2024-11-15 MED ORDER — ROSUVASTATIN CALCIUM 5 MG PO TABS
10.0000 mg | ORAL_TABLET | Freq: Every day | ORAL | Status: DC
  Administered 2024-11-15 – 2024-11-17 (×3): 10 mg via ORAL
  Filled 2024-11-15 (×3): qty 2

## 2024-11-15 MED ORDER — ACETAMINOPHEN 650 MG RE SUPP: 650.0000 mg | Freq: Four times a day (QID) | RECTAL | Status: DC | PRN

## 2024-11-15 MED ORDER — EMPAGLIFLOZIN 10 MG PO TABS
10.0000 mg | ORAL_TABLET | Freq: Every day | ORAL | Status: DC
  Administered 2024-11-16 – 2024-11-17 (×2): 10 mg via ORAL
  Filled 2024-11-15 (×3): qty 1

## 2024-11-15 MED ORDER — GADOBUTROL 1 MMOL/ML IV SOLN
10.0000 mL | Freq: Once | INTRAVENOUS | Status: AC | PRN
  Administered 2024-11-15: 10 mL via INTRAVENOUS

## 2024-11-15 MED ORDER — VANCOMYCIN HCL 1750 MG/350ML IV SOLN
1750.0000 mg | Freq: Once | INTRAVENOUS | Status: AC
  Administered 2024-11-15: 1750 mg via INTRAVENOUS
  Filled 2024-11-15: qty 350

## 2024-11-15 MED ORDER — ENOXAPARIN SODIUM 40 MG/0.4ML IJ SOSY
40.0000 mg | PREFILLED_SYRINGE | INTRAMUSCULAR | Status: DC
  Filled 2024-11-15 (×2): qty 0.4

## 2024-11-15 MED ORDER — FINERENONE 10 MG PO TABS: 10.0000 mg | ORAL_TABLET | Freq: Every day | ORAL | Status: DC

## 2024-11-15 NOTE — ED Provider Notes (Signed)
 " [facility] Provider Note   CSN: [id number] Arrival date & time: 11/15/24  1224     Patient presents with: Hand Pain and Hand Swelling   Steven Knapp is a 78 y.o. male with h/o gout, diabetes presents to the emergency department today for evaluation of right hand pain and swelling for the past week. Reports he has had allopurinol  and colchicine  without relief. He reports that this feels different than his gout. Denies any injury. He was wondering if he got bit by something but didn't see anything. Denies any fevers, chills, numbness, tingling.    Hand Pain       Prior to Admission medications  Medication Sig Start Date End Date Taking? Authorizing Provider  allopurinol  (ZYLOPRIM ) 300 MG tablet TAKE ONE AND ONE-HALF TABLETS BY MOUTH DAILY 10/07/23  Yes Deveshwar, Maya, MD  Arginine 500 MG CAPS Take 1 capsule by mouth daily.   Yes [provider]  BEPREVE 1.5 % SOLN Place 1 drop into both eyes 2 (two) times daily. 02/29/20  Yes [provider]  cholecalciferol (VITAMIN D3) 25 MCG (1000 UNIT) tablet Take 1,000 Units by mouth daily.   Yes [provider]  colchicine  0.6 MG tablet TAKE ONE TABLET BY MOUTH DAILY AS NEEDED 10/07/23  Yes Deveshwar, Maya, MD  Cyanocobalamin (VITAMIN B-12 PO) Take by mouth daily.   Yes [provider]  empagliflozin  (JARDIANCE ) 10 MG TABS tablet Take 1 tablet (10 mg total) by mouth daily before breakfast. 06/30/24  Yes Sagardia, Emil Schanz, MD  glipiZIDE  (GLUCOTROL ) 5 MG tablet TAKE ONE TABLET BY MOUTH TWICE DAILY BEFORE MEALS 11/10/23  Yes Sagardia, Emil Schanz, MD  Magnesium 100 MG TABS Take 1 tablet by mouth daily.   Yes [provider]  Multiple Vitamins-Minerals (ZINC PO) Take 1 tablet by mouth daily.   Yes [provider]  tadalafil  (CIALIS ) 20 MG tablet Take 0.5-1 tablets (10-20 mg total) by mouth every other day as needed for erectile dysfunction.  06/30/24  Yes Sagardia, Emil Schanz, MD  blood glucose meter kit and supplies Dispense based on patient and insurance preference. Use up to four times daily as directed. (FOR ICD-10 E10.9, E11.9). 02/07/21   Sagardia, Miguel Jose, MD  Continuous Blood Gluc Sensor (FREESTYLE LIBRE SENSOR SYSTEM) MISC 1 Device by Does not apply route daily. 03/07/21   Sagardia, Miguel Jose, MD  rosuvastatin  (CRESTOR ) 10 MG tablet Take 1 tablet (10 mg total) by mouth daily. Patient not taking: Reported on 11/15/2024 07/30/20   Sagardia, Miguel Jose, MD    Allergies: Other and Shellfish allergy    Review of Systems  Constitutional:  Negative for chills and fever.  Musculoskeletal:  Positive for arthralgias.    Updated Vital Signs BP (!) 143/76 (BP Location: Left Arm)   Pulse 70   Temp 98.6 F (37 C) (Oral)   Resp 15   Ht 6' (1.829 m)   Wt 95.3 kg   SpO2 100%   BMI 28.48 kg/m   Physical Exam Vitals and nursing note reviewed.  Constitutional:      General: He is not in acute distress.    Appearance: He is not ill-appearing or toxic-appearing.  Eyes:     General: No scleral icterus. Cardiovascular:     Rate and Rhythm: Normal rate.  Pulmonary:     Effort: Pulmonary effort is normal. No respiratory distress.  Musculoskeletal:        General: Swelling and tenderness present.  Comments:  Palp radial pulse.  Significant dorsal edema with increase in erythema and warmth to the hand.  Does have some swelling into the fingers as well.  Unable to fully extend fingers secondary to pain and swelling.  Does have some swelling in the palm as well.  Skin:    General: Skin is warm and dry.  Neurological:     Mental Status: He is alert.     (all labs ordered are listed, but only abnormal results are displayed) Labs Reviewed  COMPREHENSIVE METABOLIC PANEL WITH GFR - Abnormal; Notable for the following components:      Result Value   CO2 19 (*)    Glucose, Bld 155 (*)    BUN 25 (*)    Creatinine, Ser  1.82 (*)    Total Protein 8.3 (*)    GFR, Estimated 37 (*)    All other components within normal limits  SEDIMENTATION RATE - Abnormal; Notable for the following components:   Sed Rate 77 (*)    All other components within normal limits  C-REACTIVE PROTEIN - Abnormal; Notable for the following components:   CRP 8.6 (*)    All other components within normal limits  CBC WITH DIFFERENTIAL/PLATELET  URIC ACID  CBC  CREATININE, SERUM  URIC ACID    EKG: None  Radiology: MR HAND RIGHT W WO CONTRAST Result Date: 11/15/2024 CLINICAL DATA:  Right hand pain and swelling. EXAM: MRI OF THE RIGHT HAND WITHOUT AND WITH CONTRAST TECHNIQUE: Multiplanar, multisequence MR imaging of the right hand was performed before and after the administration of intravenous contrast. CONTRAST:  10mL GADAVIST  GADOBUTROL  1 MMOL/ML IV SOLN COMPARISON:  Radiographs, same date. FINDINGS: Bones/Joint/Cartilage Degenerative and erosive changes involving the interphalangeal joints, MCP joints and the wrist. 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 No acute bony findings are identified. Although there is enhancing synovitis involving the wrist joint I do not see a significant joint effusion. Septic arthritis is felt to be unlikely. There is diffuse cellulitis and myofasciitis without findings for discrete drainable soft tissue abscess or pyomyositis. Moderate scattered tenosynovitis. IMPRESSION: 1. Degenerative and erosive changes involving the interphalangeal joints, MCP joints and the wrist. 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 2. No findings  suspicious for septic arthritis or osteomyelitis. 3. No acute bony findings. 4. Diffuse cellulitis and myofasciitis without findings for discrete drainable soft tissue abscess or pyomyositis. 5. Moderate scattered tenosynovitis. Electronically Signed   By: MYRTIS Stammer M.D.   On: 11/15/2024 21:39   DG Hand Complete Right Result Date: 11/15/2024 EXAM: 3 OR MORE VIEW(S) XRAY OF THE HAND 11/15/2024 03:57:00 PM COMPARISON: None available. CLINICAL HISTORY: edema FINDINGS: BONES AND JOINTS: No acute fracture. No malalignment. Degenerative changes of the first MCP with osteophyte formation and rat bite like peripheral erosions. Severe erosive appearing degenerative changes of the interphalangeal joints with osteophyte formation. Degenerative changes of the radiocarpal joint with joint space narrowing and osteophyte formation. Carpal bone degenerative changes. SOFT TISSUES: Diffuse soft tissue edema of the hand. IMPRESSION: 1. Diffuse soft tissue edema of the hand. 2. Erosive degenerative change within the interphalangeal joints and question of gout of the first metacarpophalangeal joint. Electronically signed  by: Morgane Naveau MD 11/15/2024 05:12 PM EST RP Workstation: [id number]   Procedures   Medications Ordered in the ED  allopurinol  (ZYLOPRIM ) tablet 450 mg (450 mg Oral Given 11/15/24 2018)  rosuvastatin  (CRESTOR ) tablet 10 mg (10 mg Oral Given 11/15/24 2018)  empagliflozin  (JARDIANCE ) tablet 10 mg (has no administration in time range)  enoxaparin (LOVENOX) injection 40 mg (40 mg Subcutaneous Patient Refused/Not Given 11/15/24 2018)  acetaminophen  (TYLENOL ) tablet 650 mg (has no administration in time range)    Or  acetaminophen  (TYLENOL ) suppository 650 mg (has no administration in time range)  ondansetron (ZOFRAN) tablet 4 mg (has no administration in time range)    Or  ondansetron (ZOFRAN) injection 4 mg (has no administration in time range)  colchicine  tablet 0.3 mg (has no administration in  time range)  oxyCODONE (Oxy IR/ROXICODONE) immediate release tablet 5 mg (has no administration in time range)  cefTRIAXone  (ROCEPHIN ) 1 g in sodium chloride 0.9 % 100 mL IVPB (1 g Intravenous New Bag/Given 11/15/24 2131)  insulin aspart (novoLOG) injection 0-9 Units (has no administration in time range)  vancomycin  (VANCOCIN ) IVPB 1000 mg/200 mL premix (has no administration in time range)  cefTRIAXone  (ROCEPHIN ) 1 g in sodium chloride 0.9 % 100 mL IVPB (0 g Intravenous Stopped 11/15/24 1556)  vancomycin  (VANCOREADY) IVPB 1750 mg/350 mL (0 mg Intravenous Stopped 11/15/24 2131)  gadobutrol  (GADAVIST ) 1 MMOL/ML injection 10 mL (10 mLs Intravenous Contrast Given 11/15/24 1844)    Clinical Course as of 11/15/24 2143  Tue Nov 15, 2024  1844 Would cover for cellulitis and treat for gout as well. Dr. Murrell thinks this is a gouty attack. Wrist splint. Dr. Murrell has seen the patient. Can remove NPO status. Someone from the team will evaluate him tomorrow.  [RR]    Clinical Course User Index [RR] [name], PA-C   Medical Decision Making Amount and/or Complexity of Data Reviewed Labs: ordered. Radiology: ordered.  Risk Prescription drug management. Decision regarding hospitalization.   78 y.o. male presents to the ER for evaluation of right hand pain/swelling. Differential diagnosis includes but is not limited to infection, cellulitis, gout. Vital signs elevated BP otherwise unremarkable. Physical exam as noted above.   I consulted orthopedics given the patient's presentation as well as saying this does not feel like his gout previously.  Steven Purchase, PA with orthopedics assessed the patient.  Recommends MRI with and without contrast as well as IV antibiotics.  Recommends ESR, CRP, and uric acid.  Medical admission.  I would have the patient some Rocephin  after speaking with pharmacy.  Seems to be more strep.  No purulence appreciated.  I independently reviewed and interpreted the  patient's labs.  CRP and ESR are elevated.  Uric acid within normal limits.  CBC without leukocytosis or anemia.  CMP shows a bicarb of 19, glucose 155, BUN of 25 with creatinine 1.82.  Total protein 8.3.  No other electrolyte or LFT abnormality.  MRI pending.  Patient was admitted to [facility].  Unfortunately, [facility] was able to be reached after the patient already been admitted.  Dr. Murrell called me after evaluate the patient.  He thinks this is likely more of a gouty attack.  Would still recommend covering for cellulitis and treatment for gout and so we will reevaluate him in the morning.  Recommends a wrist splint as well.  Can remove n.p.o. status is does not see him needing surgery today. Was able to communicated this conversation with Dr. Charlton.   Portions  of this report may have been transcribed using voice recognition software. Every effort was made to ensure accuracy; however, inadvertent computerized transcription errors may be present.    Final diagnoses:  Uncontrolled type 2 diabetes mellitus with hyperglycemia (HCC)  Chronic renal impairment, stage 3a  Type 2 diabetes mellitus with stage 3b chronic kidney disease, without long-term current use of insulin (HCC)  Diabetic nephropathy associated with type 2 diabetes mellitus [facility])    ED Discharge Orders     None          [name], PA-C 11/15/24 2159    Ruthe Cornet, DO 11/15/24 2218  "

## 2024-11-15 NOTE — Assessment & Plan Note (Signed)
 Continue allopurinol  and colchicine  Check uric acid

## 2024-11-15 NOTE — Consult Note (Addendum)
 Reason for Consult:Right hand swelling Referring Physician: Benton Shone Time called: 1452 Time at bedside: 1456   Steven Knapp is an 78 y.o. male.  HPI: Steven Knapp comes in with about a week of right hand pain and swelling. He denies known trauma or break in the skin. He has a hx/o gout and has been treating as such without success but says this does not feel like gout attacks he's had in the past. He denies fevers, chills, sweats, N/V. He is RHD and retired.  Past Medical History:  Diagnosis Date   Arthritis    Cataract    Diabetes mellitus without complication (HCC)    Spinal stenosis    per patient    Past Surgical History:  Procedure Laterality Date   CATARACT EXTRACTION, BILATERAL     04/2023, 05/2023    Family History  Problem Relation Age of Onset   Diabetes Mother    Hypertension Mother    Diabetes Brother    Audiological Scientist    Healthy Daughter    Healthy Daughter    Healthy Son    Healthy Son    Scoliosis Son     Social History:  reports that he quit smoking about 2 years ago. His smoking use included cigarettes. He started smoking about 54 years ago. He has a 51 pack-year smoking history. He has been exposed to tobacco smoke. He has never used smokeless tobacco. He reports current alcohol use of about 1.0 standard drink of alcohol per week. He reports that he does not use drugs.  Allergies: Allergies[1]  Medications: I have reviewed the patient's current medications.  No results found for this or any previous visit (from the past 48 hours).  No results found.  Review of Systems  Constitutional:  Negative for chills, diaphoresis and fever.  HENT:  Negative for ear discharge, ear pain, hearing loss and tinnitus.   Eyes:  Negative for photophobia and pain.  Respiratory:  Negative for cough and shortness of breath.   Cardiovascular:  Negative for chest pain.  Gastrointestinal:  Negative for abdominal pain, nausea and vomiting.  Genitourinary:  Negative for  dysuria, flank pain, frequency and urgency.  Musculoskeletal:  Positive for arthralgias (Right hand). Negative for back pain, myalgias and neck pain.  Neurological:  Negative for dizziness and headaches.  Hematological:  Does not bruise/bleed easily.  Psychiatric/Behavioral:  The patient is not nervous/anxious.    Blood pressure (!) 145/87, pulse 69, temperature (!) 97.3 F (36.3 C), resp. rate 17, height 6' (1.829 m), weight 95.3 kg, SpO2 100%. Physical Exam Constitutional:      General: He is not in acute distress.    Appearance: He is well-developed. He is not diaphoretic.  HENT:     Head: Normocephalic and atraumatic.  Eyes:     General: No scleral icterus.       Right eye: No discharge.        Left eye: No discharge.     Conjunctiva/sclera: Conjunctivae normal.  Cardiovascular:     Rate and Rhythm: Normal rate and regular rhythm.  Pulmonary:     Effort: Pulmonary effort is normal. No respiratory distress.  Musculoskeletal:     Cervical back: Normal range of motion.     Comments: Right shoulder, elbow, wrist, digits- no skin wounds, dorsal hand edema w/o fluctuance, dorsal pain with passive extension of fingers, no instability, no blocks to motion  Sens  Ax/R/M/U intact  Mot   Ax/ R/ PIN/ M/ AIN/ U intact  Rad  2+  Skin:    General: Skin is warm and dry.  Neurological:     Mental Status: He is alert.  Psychiatric:        Mood and Affect: Mood normal.        Behavior: Behavior normal.     Assessment/Plan: Right hand swelling -- Recommend CRP, ESR, and uric acid. Also MRI w/wo to r/o deep space infection. Likely needs admission and IV abx. No obvious surgical indication at this point.    Ozell DOROTHA Ned, PA-C Orthopedic Surgery [phone] 11/15/2024, 3:02 PM   Addendum (11/15/2024): Patient seen and examined.  Agree with above. History: 78 year old male states that approximately 1 week ago he had a gouty attack starting a day or so before Christmas.  He had  pain and swelling of the elbow wrist and hand.  He started on his colchicine  and the elbow got better but the hand is lingered.  He started to run out of colchicine  and took some allopurinol  and then found more colchicine  and returned to that.  He does not feel that the hand has gotten worse it is just not improved.  He states it feels similar to gout attacks in the past.  He is unsure if he may have been bitten by mosquito or stung by bee when he was outside over the weekend but does not remember this happening.  No fevers or chills. Exam: Intact sensation capillary refill in the fingertips.  He is swollen on the dorsum of the hand and into the fingers.  Mild rubor but no proximal streaking.  No wounds.  There are some rubor on the proximal phalanx of the digits.  Mild pain with passive and active motion of the wrist and digits. Radiographs: AP lateral bleak views of the right hand and wrist show degenerative changes at PIP and DIP joints.  Joint space narrowing of the MP joints particularly index finger.  Degenerative change at the DRUJ and at the LT joint. MRI viewed.  Edema in dorsal tissues.  No abscess collection noted.  Inflammation around fourth dorsal compartment tendons. Labs: WBC 7.8, ESR 77, CRP 8.6, uric acid 6.0. A/P: Favor gouty attack over infectious process given this has been going on a week and he does not have elevated white count and the hand is not getting worse.  Recommend medical treatment for gout with coverage with antibiotics as precaution.  Splint for comfort.  Will follow.    [1]  Allergies Allergen Reactions   Other Hives and Itching    strawberries, wine, and corn   Shellfish Allergy

## 2024-11-15 NOTE — ED Triage Notes (Signed)
 PT arrives POV. PT reports pain and swelling to his right hand for the past week. Reports it could be a gout flair up or he could have also been bitten by something on his right hand. Pt is AxOx4.

## 2024-11-15 NOTE — Assessment & Plan Note (Signed)
 Renal function with serum cr at 1,82, baseline is 1,5 to 1,6 per old records  Continue close follow up renal function and electrolytes Hold on non steroidal anti inflammatory agents  Avoid hypotension and nephrotoxic medications Pharmacy dosing Vancomycin 

## 2024-11-15 NOTE — Assessment & Plan Note (Signed)
 Continue glucose cover and monitoring with insulin sliding scale Continue SGLT 2 ing  His admission glucose is note very elevated.   Continue statin therapy

## 2024-11-15 NOTE — H&P (Signed)
 " History and Physical    Patient: Steven Knapp FMW:[id number] DOB: May 23, 1946 DOA: 11/15/2024 DOS: the patient was seen and examined on 11/15/2024 PCP: Purcell Emil Schanz, MD  Patient coming from: Home  Chief Complaint:  Chief Complaint  Patient presents with   Hand Pain   Hand Swelling   HPI: Steven Knapp is a 78 y.o. male with medical history significant of gout, T2DM and hypertension who presented with right hand painful edema.   Symptoms started about 6 days ago, Christmas evening, when he noticed edema and tenderness from his right elbow to his right hand. During the day he had been working in his yard, manipulating some bushes and plants. He thinks an insect may had bitten him on the dorsum of his right hand.  He took colchicine  and allopurinol  for suspected gout attack with some improvement in his symptoms but his hand remained edematous and tender. Over the following days his hand pain and edema continue to worsening prompting him to come to the ED.   At the time of my examination the pain is moderate in intensity, dull in nature with no radiation, no associated fevers or chills, worse to the touch and movement. He can't flex his fingers due pain and edema.    Review of Systems: As mentioned in the history of present illness. All other systems reviewed and are negative. Past Medical History:  Diagnosis Date   Arthritis    Cataract    Diabetes mellitus without complication (HCC)    Spinal stenosis    per patient   Past Surgical History:  Procedure Laterality Date   CATARACT EXTRACTION, BILATERAL     04/2023, 05/2023   Social History:  reports that he quit smoking about 2 years ago. His smoking use included cigarettes. He started smoking about 54 years ago. He has a 51 pack-year smoking history. He has been exposed to tobacco smoke. He has never used smokeless tobacco. He reports current alcohol use of about 1.0 standard drink of alcohol per week. He reports that he does not  use drugs.  Allergies[1]  Family History  Problem Relation Age of Onset   Diabetes Mother    Hypertension Mother    Diabetes Brother    Audiological Scientist    Healthy Daughter    Healthy Daughter    Healthy Son    Healthy Son    Scoliosis Son     Prior to Admission medications  Medication Sig Start Date End Date Taking? Authorizing Provider  allopurinol  (ZYLOPRIM ) 300 MG tablet TAKE ONE AND ONE-HALF TABLETS BY MOUTH DAILY 10/07/23   Dolphus Reiter, MD  Arginine 500 MG CAPS Take by mouth as needed.    [provider]  BEPREVE 1.5 % SOLN 1 drop 2 (two) times daily. 02/29/20   [provider]  blood glucose meter kit and supplies Dispense based on patient and insurance preference. Use up to four times daily as directed. (FOR ICD-10 E10.9, E11.9). 02/07/21   Sagardia, Miguel Jose, MD  clobetasol cream (TEMOVATE) 0.05 % Apply 1 application  topically 2 (two) times daily.    [provider]  colchicine  0.6 MG tablet TAKE ONE TABLET BY MOUTH DAILY AS NEEDED 10/07/23   Dolphus Reiter, MD  Continuous Blood Gluc Sensor (FREESTYLE LIBRE SENSOR SYSTEM) MISC 1 Device by Does not apply route daily. 03/07/21   Sagardia, Miguel Jose, MD  Cyanocobalamin (VITAMIN B-12 PO) Take by mouth daily.    [provider]  Dupilumab (DUPIXENT [place]) Inject into  the skin.    [provider]  empagliflozin  (JARDIANCE ) 10 MG TABS tablet Take 1 tablet (10 mg total) by mouth daily before breakfast. 06/30/24   Purcell Emil Schanz, MD  Finerenone  (KERENDIA ) 10 MG TABS Take 1 tablet (10 mg total) by mouth daily. 06/24/23   Purcell Emil Schanz, MD  glipiZIDE  (GLUCOTROL ) 5 MG tablet TAKE ONE TABLET BY MOUTH TWICE DAILY BEFORE MEALS 11/10/23   Sagardia, Miguel Jose, MD  ibuprofen (ADVIL) 200 MG tablet Take 200 mg by mouth every 6 (six) hours as needed for moderate pain.    [provider]  Multiple Vitamins-Minerals (ZINC PO) Take by mouth daily.    [provider]   rosuvastatin  (CRESTOR ) 10 MG tablet Take 1 tablet (10 mg total) by mouth daily. 07/30/20   Purcell Emil Schanz, MD  SYSTANE ULTRA 0.4-0.3 % SOLN SMARTSIG:1 Drop(s) In Eye(s) As Needed 11/23/19   [provider]  tadalafil  (CIALIS ) 20 MG tablet Take 0.5-1 tablets (10-20 mg total) by mouth every other day as needed for erectile dysfunction. 06/30/24   Purcell Emil Schanz, MD    Physical Exam: Vitals:   11/15/24 1230 11/15/24 1404  BP: (!) 145/87   Pulse: 69   Resp: 17   Temp: (!) 97.3 F (36.3 C)   SpO2: 100%   Weight:  95.3 kg  Height:  6' (1.829 m)   BP (!) 145/87   Pulse 69   Temp (!) 97.3 F (36.3 C)   Resp 17   Ht 6' (1.829 m)   Wt 95.3 kg   SpO2 100%   BMI 28.48 kg/m   Neurology awake and alert ENT with no pallor or icterus Cardiovascular with S1 and S2 present and regular with no gallops, rubs or murmurs Respiratory with no rales or wheezing, no rhonchi Abdomen with no distention, soft and non tender No lower extremity edema  Right hand with significant non pitting edema, tender to palpation and mild increased local temperature, no fluctuation, not able to flex fingers due to pain and edema, limited right wrist movement due to pain and edema.    Right had palm   Right hand dorsum   Data Reviewed:   Na 138, K 4.3 Cl 104 bicarbonate 19 glucose 155 bun 25 cr 1,82  AST 29 ALT 16  CRP 8,6 sed rte 77  Wbc 7.8 hgb 14.7 plt 276   Right hand radiograph with soft tissue edema Erosive degenerative change within the interphalangeal and question of gout of the first metacarpophalangeal joint.    Assessment and Plan: * Cellulitis of hand Right hand cellulitis in the setting of acute gout flare.   Plan to continue antibiotic therapy with ceftriaxone  and will add IV vancomycin  for possible MRSA.  Continue colchicine  for anti inflammatory, hold on steroids until completely ruled out local infection. Add pain control with acetaminophen  and oxycodone Hold on  NSAIDS due to reduced GFR.  Follow up on hand MRI  Follow up with orthopedics recommendations   Essential hypertension Continue blood pressure monitoring. Continue with finerenone  for now with close follow up on renal function and electrolytes   Chronic renal impairment, stage 3a Renal function with serum cr at 1,82, baseline is 1,5 to 1,6 per old records  Continue close follow up renal function and electrolytes Hold on non steroidal anti inflammatory agents  Avoid hypotension and nephrotoxic medications Pharmacy dosing Vancomycin     History of gout Continue allopurinol  and colchicine  Check uric acid   Type 2 diabetes mellitus  with hyperlipidemia (HCC) Continue glucose cover and monitoring with insulin sliding scale Continue SGLT 2 ing  His admission glucose is note very elevated.   Continue statin therapy   History of rheumatoid arthritis Follow up as outpatient   Advance Care Planning:   Code Status: Full Code   Consults: orthopedics with hand   Family Communication: no family at the bedside   Severity of Illness: The appropriate patient status for this patient is INPATIENT. Inpatient status is judged to be reasonable and necessary in order to provide the required intensity of service to ensure the patient's safety. The patient's presenting symptoms, physical exam findings, and initial radiographic and laboratory data in the context of their chronic comorbidities is felt to place them at high risk for further clinical deterioration. Furthermore, it is not anticipated that the patient will be medically stable for discharge from the hospital within 2 midnights of admission.   * I certify that at the point of admission it is my clinical judgment that the patient will require inpatient hospital care spanning beyond 2 midnights from the point of admission due to high intensity of service, high risk for further deterioration and high frequency of surveillance  required.*  Author: Elidia Toribio Furnace, MD 11/15/2024 4:57 PM  For on call review [email or web address].      [1]  Allergies Allergen Reactions   Other Hives and Itching    strawberries, wine, and corn   Shellfish Allergy    "

## 2024-11-15 NOTE — Assessment & Plan Note (Signed)
 Follow up as outpatient

## 2024-11-15 NOTE — Assessment & Plan Note (Signed)
 Right hand cellulitis in the setting of acute gout flare.   Plan to continue antibiotic therapy with ceftriaxone  and will add IV vancomycin  for possible MRSA.  Continue colchicine  for anti inflammatory, hold on steroids until completely ruled out local infection. Add pain control with acetaminophen  and oxycodone Hold on NSAIDS due to reduced GFR.  Follow up on hand MRI  Follow up with orthopedics recommendations

## 2024-11-15 NOTE — Progress Notes (Signed)
 Pharmacy Antibiotic Note  Mindy Behnken is a 78 y.o. male admitted on 11/15/2024 presenting with hand swelling, cellulitis.  Pharmacy has been consulted for vancomycin  dosing.  Plan: Vancomycin  1750 mg Iv x 1, then 1g IV q 24h (eAUC 430) Monitor renal function, Cx and clinical progression to narrow Vancomycin  levels as indicated   Height: 6' (182.9 cm) Weight: 95.3 kg (210 lb) IBW/kg (Calculated) : 77.6  Temp (24hrs), Avg:98 F (36.7 C), Min:97.3 F (36.3 C), Max:98.6 F (37 C)  Recent Labs  Lab 11/15/24 1517  WBC 7.8  CREATININE 1.82*    Estimated Creatinine Clearance: 38.8 mL/min (A) (by C-G formula based on SCr of 1.82 mg/dL (H)).    Allergies[1]  Dorn Poot, PharmD, [facility] Clinical Pharmacist ED Pharmacist Phone # [phone] 11/15/2024 6:20 PM      [1]  Allergies Allergen Reactions   Other Hives and Itching    strawberries, wine, and corn   Shellfish Allergy

## 2024-11-15 NOTE — Assessment & Plan Note (Signed)
 Continue blood pressure monitoring. Continue with finerenone  for now with close follow up on renal function and electrolytes

## 2024-11-15 NOTE — ED Provider Triage Note (Addendum)
 Emergency Medicine Provider Triage Evaluation Note  Steven Knapp , a 78 y.o. male  was evaluated in triage.  Pt complains of right hand pain and swelling for the past week. Reports he has had allopurinol  and colchicine  without relief. He reports that this feels different than his gout. Denies any injury. He was wondering if he got bit by something but didn't see anything .  Review of Systems  Positive:  Negative:   Physical Exam  BP (!) 145/87   Pulse 69   Temp (!) 97.3 F (36.3 C)   Resp 17   Ht 6' (1.829 m)   Wt 95.3 kg   SpO2 100%   BMI 28.48 kg/m  Gen:   Awake, no distress   Resp:  Normal effort  MSK:   Moves extremities without difficulty  Other:  Palp radial pulse.  Significant dorsal edema with increase in erythema and warmth to the hand.  Does have some swelling into the fingers as well.  Unable to fully extend fingers secondary to pain and swelling.  Does have some swelling in the palm as well.  Medical Decision Making  Medically screening exam initiated at 2:54 PM.  Appropriate orders placed.  Steven Knapp was informed that the remainder of the evaluation will be completed by another provider, this initial triage assessment does not replace that evaluation, and the importance of remaining in the ED until their evaluation is complete.  2:57 PM Consulted Steven Knapp with orthopedics who will evaluated the patient.   3:00 PM Steven Knapp assessed the patient at bedside.  Feels this is possibly his tenosynovitis.  Would admit to medicine for antibiotic treatment.  Recommends MRI with and without of the hand.  ESR, CRP, uric acid.  IV antibiotics.   Steven Ernst, PA-C 11/15/24 1458    Steven Ernst, PA-C 11/15/24 2154

## 2024-11-15 NOTE — Telephone Encounter (Signed)
 FYI Only or Action Required?: FYI only for provider: ED advised.  Patient was last seen in primary care on 06/30/2024 by Purcell Emil Schanz, MD.  Called Nurse Triage reporting Joint Swelling.  Symptoms began a week ago.  Interventions attempted: Prescription medications: colchicine  and Ice/heat application.  Symptoms are: unchanged.  Triage Disposition: Go to ED Now (Notify PCP)  Patient/caregiver understands and will follow disposition?: Yes    Copied from CRM [id number]. Topic: Clinical - Red Word Triage >> Nov 15, 2024  9:51 AM Rea ORN wrote: Red Word that prompted transfer to Nurse Triage: right hand swelling for the past week. Pt thought it was gout took his gout rx. Gout has resolved but the swelling is still there. Reason for Disposition  [1] Can't use hand or can barely use hand AND [2] new-onset  Answer Assessment - Initial Assessment Questions Right hand swelling x 1 week, pt thought it was his gout so took gout medicine. He states the gout has disipated but swelling is still there. He states its his whole hand to his wrist that is swollen. He states he's now concerned that he could have been bitten, there is a mark on his hand. He has been icing it and nothing is helping. States there is no redness but his hand especially all his knuckles are swollen. He states he can minimally use that hand, opening and closing it is painful and it is tender to touch. Given these symptoms, RN recommendations are for pt to go to ER to be evaluated. Pt stated understanding.  Due to ED dispo not all triage questions answered.    1. ONSET: When did the swelling start? (e.g., minutes, hours, days)     About a week ago 2. LOCATION: What part of the hand is swollen?  Are both hands swollen or just one hand?     Right hand, whole hand to the wrist 3. TYPE : What does it look like? (e.g., ball, lump; localized; hand swelling)     Hand swollen 4. SWELLING SEVERITY: If more than a lump or  localized, ask: How bad is the hand swelling? (e.g., mild, moderate, severe; describe)     5. REDNESS: Is there redness or signs of infection?     denies 6. PAIN: Is the swelling painful to touch? If Yes, ask: How painful is it?   (Scale 1-10; mild, moderate or severe)      7. FEVER: Do you have a fever? If Yes, ask: What is it, how was it measured, and when did it start?      denies 8. CAUSE: What do you think is causing the hand swelling? (e.g., heat, insect bite, pregnancy, recent injury)     unsure 9. MEDICAL HISTORY: Do you have a history of heart failure, kidney disease, liver failure, or cancer?      10. RECURRENT SYMPTOM: Have you had hand swelling before? If Yes, ask: When was the last time? What happened that time?        11. OTHER SYMPTOMS: Do you have any other symptoms? (e.g., blurred vision, difficulty breathing, headache)       denies  Protocols used: Hand Swelling-A-AH

## 2024-11-15 NOTE — ED Triage Notes (Addendum)
 Right hand is swollen, warm to the touch, red, and painful to the wrist. He is having issues moving his fingers and his wrist. He thinks he has possibly been bitten by an insect or possibly a flare up of his gout. He said he has been taking his allopurinol  and colchicine  for the past week and it has not gone away. He is unsure of what could have bit him. Denies having any fevers

## 2024-11-16 DIAGNOSIS — E785 Hyperlipidemia, unspecified: Secondary | ICD-10-CM | POA: Diagnosis not present

## 2024-11-16 DIAGNOSIS — N2889 Other specified disorders of kidney and ureter: Secondary | ICD-10-CM | POA: Diagnosis not present

## 2024-11-16 DIAGNOSIS — E1169 Type 2 diabetes mellitus with other specified complication: Secondary | ICD-10-CM | POA: Diagnosis not present

## 2024-11-16 DIAGNOSIS — L03119 Cellulitis of unspecified part of limb: Secondary | ICD-10-CM | POA: Diagnosis not present

## 2024-11-16 DIAGNOSIS — M109 Gout, unspecified: Principal | ICD-10-CM

## 2024-11-16 DIAGNOSIS — Z8739 Personal history of other diseases of the musculoskeletal system and connective tissue: Secondary | ICD-10-CM | POA: Diagnosis not present

## 2024-11-16 LAB — CBC
HCT: 39.3 % (ref 39.0–52.0)
Hemoglobin: 13.4 g/dL (ref 13.0–17.0)
MCH: 29.8 pg (ref 26.0–34.0)
MCHC: 34.1 g/dL (ref 30.0–36.0)
MCV: 87.5 fL (ref 80.0–100.0)
Platelets: 304 K/uL (ref 150–400)
RBC: 4.49 MIL/uL (ref 4.22–5.81)
RDW: 15.1 % (ref 11.5–15.5)
WBC: 6.3 K/uL (ref 4.0–10.5)
nRBC: 0 % (ref 0.0–0.2)

## 2024-11-16 LAB — RENAL FUNCTION PANEL
Albumin: 3.7 g/dL (ref 3.5–5.0)
Albumin: 3.6 g/dL (ref 3.5–5.0)
Anion gap: 14 (ref 5–15)
Anion gap: 11 (ref 5–15)
BUN: 22 mg/dL (ref 8–23)
BUN: 20 mg/dL (ref 8–23)
CO2: 19 mmol/L — ABNORMAL LOW (ref 22–32)
CO2: 21 mmol/L — ABNORMAL LOW (ref 22–32)
Calcium: 10 mg/dL (ref 8.9–10.3)
Calcium: 9.9 mg/dL (ref 8.9–10.3)
Chloride: 105 mmol/L (ref 98–111)
Chloride: 103 mmol/L (ref 98–111)
Creatinine, Ser: 1.54 mg/dL — ABNORMAL HIGH (ref 0.61–1.24)
Creatinine, Ser: 1.46 mg/dL — ABNORMAL HIGH (ref 0.61–1.24)
GFR, Estimated: 45 mL/min — ABNORMAL LOW
GFR, Estimated: 48 mL/min — ABNORMAL LOW
Glucose, Bld: 131 mg/dL — ABNORMAL HIGH (ref 70–99)
Glucose, Bld: 158 mg/dL — ABNORMAL HIGH (ref 70–99)
Phosphorus: 3 mg/dL (ref 2.5–4.6)
Phosphorus: 3 mg/dL (ref 2.5–4.6)
Potassium: 4.2 mmol/L (ref 3.5–5.1)
Potassium: 3.8 mmol/L (ref 3.5–5.1)
Sodium: 138 mmol/L (ref 135–145)
Sodium: 135 mmol/L (ref 135–145)

## 2024-11-16 LAB — CREATININE, SERUM
Creatinine, Ser: 1.57 mg/dL — ABNORMAL HIGH (ref 0.61–1.24)
GFR, Estimated: 44 mL/min — ABNORMAL LOW

## 2024-11-16 LAB — CBG MONITORING, ED
Glucose-Capillary: 133 mg/dL — ABNORMAL HIGH (ref 70–99)
Glucose-Capillary: 188 mg/dL — ABNORMAL HIGH (ref 70–99)
Glucose-Capillary: 218 mg/dL — ABNORMAL HIGH (ref 70–99)

## 2024-11-16 LAB — URIC ACID: Uric Acid, Serum: 5.5 mg/dL (ref 3.7–8.6)

## 2024-11-16 LAB — GLUCOSE, CAPILLARY: Glucose-Capillary: 140 mg/dL — ABNORMAL HIGH (ref 70–99)

## 2024-11-16 NOTE — Progress Notes (Signed)
 Pt arrived to 6  north room 6 alert and oriented x4. Pt ambulated from wheelchair to bed with x1 assist. Noted swelling in right hand with pain level 4/10. Bed in lowest position. Call light in reach. All needs met at this time.

## 2024-11-16 NOTE — Care Management CC44 (Signed)
"         Condition Code 44 Documentation Completed  Patient Details  Name: Steven Knapp MRN: [id number] Date of Birth: 1946-02-05   Condition Code 44 given:  Yes Patient signature on Condition Code 44 notice:  Yes Documentation of 2 MD's agreement:  Yes Code 44 added to claim:  Yes    Debarah Saunas, RN 11/16/2024, 2:15 PM  "

## 2024-11-16 NOTE — Progress Notes (Signed)
 " PROGRESS NOTE    Steven Knapp  FMW:[id number] DOB: [birth year] DOA: 11/15/2024 PCP: Purcell Emil Schanz, MD    Brief Narrative:    78 y.o. male with medical history significant of gout, T2DM and hypertension who presented with right hand painful edema.    Symptoms started about 6 days ago, Christmas evening, when he noticed edema and tenderness from his right elbow to his right hand. During the day he had been working in his yard, manipulating some bushes and plants. He thinks an insect may had bitten him on the dorsum of his right hand.  He took colchicine  and allopurinol  for suspected gout attack with some improvement in his symptoms but his hand remained edematous and tender. Over the following days his hand pain and edema continue to worsening prompting him to come to the ED.    Assessment and Plan:  # Acute gout flare of right hand  # Possible cellulitis of right hand - Patient with history of gout and previous episodes of gout flares in various joints, including his hand - MRI hand showed degenerative and erosive changes involving the interphalangeal joints, MCP joints and the wrist. 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 No findings suspicious for septic arthritis or osteomyelitis. Noted diffuse cellulitis and myofasciitis without findings of discrete drainable soft tissue abscess or pyomyositis. Noted moderate scattered tensosynovitis - Evaluated by orthopedic surgery - felt symptoms most consistent with gouty attack over infectious process given duration of symptoms and no leukocytosis or worsening of symptoms. Recommended treatment of gout with antibiotic coverage for precaution - Patient reports significant improvement in symptoms today with decrease in swelling and improvement in ROM -  Continue IV Vancomycin  and Rocephin  for now - Continue colchicine  and allopurinol  - Orthopedic surgery following  Essential hypertension - Continue finerenone     CKD stage IIIa - Renal function with serum cr at 1.82, baseline is 1.5 - 1.6 per old records - Monitor renal function - Avoid NSAIDs   Type 2 diabetes mellitus with hyperlipidemia (HCC) - Hgb A1c 6.9 (06/08/2024) - Continue home Jardiance  - Continue SSI 0-9 AC  - Continue statin therapy    History of rheumatoid arthritis - Follow up as outpatient   Scheduled Meds:  allopurinol   450 mg Oral Daily   colchicine   0.3 mg Oral BID   empagliflozin   10 mg Oral QAC breakfast   enoxaparin (LOVENOX) injection  40 mg Subcutaneous Q24H   insulin aspart  0-9 Units Subcutaneous TID WC   rosuvastatin   10 mg Oral Daily   Continuous Infusions:  cefTRIAXone  (ROCEPHIN )  IV Stopped (11/15/24 2224)   vancomycin      PRN Meds:.acetaminophen  **OR** acetaminophen , ondansetron **OR** ondansetron (ZOFRAN) IV, oxyCODONE  Current Outpatient Medications  Medication Instructions   allopurinol  (ZYLOPRIM ) 450 mg, Oral, Daily   Arginine 500 MG CAPS 1 capsule, Oral, Daily   BEPREVE 1.5 % SOLN 1 drop, Both Eyes, 2 times daily   blood glucose meter kit and supplies Dispense based on patient and insurance preference. Use up to four times daily as directed. (FOR ICD-10 E10.9, E11.9).   cholecalciferol (VITAMIN D3) 1,000 Units, Oral, Daily   colchicine  0.6 mg, Oral, Daily PRN   Continuous Blood Gluc Sensor (FREESTYLE LIBRE SENSOR SYSTEM) MISC 1 Device, Does not apply, Daily   Cyanocobalamin (VITAMIN B-12 PO) Oral, Daily   empagliflozin  (  JARDIANCE ) 10 mg, Oral, Daily before breakfast   glipiZIDE  (GLUCOTROL ) 5 MG tablet TAKE ONE TABLET BY MOUTH TWICE DAILY BEFORE MEALS   Magnesium 100 MG TABS 1 tablet, Oral, Daily   Multiple Vitamins-Minerals (ZINC PO) 1 tablet, Oral, Daily   rosuvastatin  (CRESTOR ) 10 mg, Oral, Daily   tadalafil  (CIALIS ) 10-20 mg,  Oral, Every 48 hours PRN    DVT prophylaxis: enoxaparin (LOVENOX) injection 40 mg Start: 11/15/24 2000 SCDs Start: 11/15/24 1950   Code Status:   Code Status: Full Code  Family Communication: None  Disposition Plan: Home pending clinical improvement  PT -   -   OT -   -    Level of care: Med-Surg  Consultants:  ortho  Procedures:  None  Antimicrobials: Vanc, Rocephin    Subjective: NAEO. Reports significant improvement in right hand swelling and ROM.   Objective: Vitals:   11/15/24 2224 11/16/24 0337 11/16/24 0653 11/16/24 0818  BP: (!) 150/78 (!) 148/74 (!) 146/86 (!) 143/78  Pulse: 72 74 62 64  Resp: 18 18 14 16   Temp: 98.8 F (37.1 C) 98.8 F (37.1 C) 97.9 F (36.6 C) 98 F (36.7 C)  TempSrc: Oral Oral Oral Oral  SpO2: 100% 100% 100% 100%  Weight:      Height:        Intake/Output Summary (Last 24 hours) at 11/16/2024 9078 Last data filed at 11/15/2024 1556 Gross per 24 hour  Intake 100 ml  Output --  Net 100 ml   Filed Weights   11/15/24 1404  Weight: 95.3 kg    Examination:  Gen: NAD, A&Ox3 CV: RRR, no murmurs Resp: normal WOB, CTAB, no w/r/r Abd: Soft, NTND Ext: Right hand swelling, warm to touch, without pain, sensation intact, radial pulse 2+  Skin: Warm, dry Neuro: No focal deficits Psych: Calm, cooperative, appropriate affect    Data Reviewed: I have personally reviewed following labs and imaging studies  CBC: Recent Labs  Lab 11/15/24 1517  WBC 7.8  NEUTROABS 5.7  HGB 14.7  HCT 43.7  MCV 88.6  PLT 276   Basic Metabolic Panel: Recent Labs  Lab 11/15/24 1517  NA 138  K 4.3  CL 104  CO2 19*  GLUCOSE 155*  BUN 25*  CREATININE 1.82*  CALCIUM  10.1   GFR: Estimated Creatinine Clearance: 38.8 mL/min (A) (by C-G formula based on SCr of 1.82 mg/dL (H)). Liver Function Tests: Recent Labs  Lab 11/15/24 1517  AST 29  ALT 16  ALKPHOS 69  BILITOT 0.5  PROT 8.3*  ALBUMIN 3.9   No results for input(s): LIPASE,  AMYLASE in the last 168 hours. No results for input(s): AMMONIA in the last 168 hours. Coagulation Profile: No results for input(s): INR, PROTIME in the last 168 hours. Cardiac Enzymes: No results for input(s): CKTOTAL, CKMB, CKMBINDEX, TROPONINI in the last 168 hours. BNP (last 3 results) No results for input(s): PROBNP in the last 8760 hours. HbA1C: No results for input(s): HGBA1C in the last 72 hours. CBG: Recent Labs  Lab 11/15/24 2359 11/16/24 0815  GLUCAP 188* 133*   Lipid Profile: No results for input(s): CHOL, HDL, LDLCALC, TRIG, CHOLHDL, LDLDIRECT in the last 72 hours. Thyroid  Function Tests: No results for input(s): TSH, T4TOTAL, FREET4, T3FREE, THYROIDAB in the last 72 hours. Anemia Panel: No results for input(s): VITAMINB12, FOLATE, FERRITIN, TIBC, IRON, RETICCTPCT in the last 72 hours. Sepsis Labs: No results for input(s): PROCALCITON, LATICACIDVEN in the last 168 hours.  No results found for this or  any previous visit (from the past 240 hours).   Radiology Studies: MR HAND RIGHT W WO CONTRAST Result Date: 11/15/2024 CLINICAL DATA:  Right hand pain and swelling. EXAM: MRI OF THE RIGHT HAND WITHOUT AND WITH CONTRAST TECHNIQUE: Multiplanar, multisequence MR imaging of the right hand was performed before and after the administration of intravenous contrast. CONTRAST:  10mL GADAVIST  GADOBUTROL  1 MMOL/ML IV SOLN COMPARISON:  Radiographs, same date. FINDINGS: Bones/Joint/Cartilage Degenerative and erosive changes involving the interphalangeal joints, MCP joints and the wrist. 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 No acute bony findings are identified. Although there is enhancing synovitis involving the wrist joint I do not see  a significant joint effusion. Septic arthritis is felt to be unlikely. There is diffuse cellulitis and myofasciitis without findings for discrete drainable soft tissue abscess or pyomyositis. Moderate scattered tenosynovitis. IMPRESSION: 1. Degenerative and erosive changes involving the interphalangeal joints, MCP joints and the wrist. 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 2. No findings suspicious for septic arthritis or osteomyelitis. 3. No acute bony findings. 4. Diffuse cellulitis and myofasciitis without findings for discrete drainable soft tissue abscess or pyomyositis. 5. Moderate scattered tenosynovitis. Electronically Signed   By: MYRTIS Stammer M.D.   On: 11/15/2024 21:39   DG Hand Complete Right Result Date: 11/15/2024 EXAM: 3 OR MORE VIEW(S) XRAY OF THE HAND 11/15/2024 03:57:00 PM COMPARISON: None available. CLINICAL HISTORY: edema FINDINGS: BONES AND JOINTS: No acute fracture. No malalignment. Degenerative changes of the first MCP with osteophyte formation and rat bite like peripheral erosions. Severe erosive appearing degenerative changes of the interphalangeal joints with osteophyte formation. Degenerative changes of the radiocarpal joint with joint space narrowing and osteophyte formation. Carpal bone degenerative changes. SOFT TISSUES: Diffuse soft tissue edema of the hand. IMPRESSION: 1. Diffuse soft tissue edema of the hand. 2. Erosive degenerative change within the interphalangeal joints and question of gout of the first metacarpophalangeal joint. Electronically signed by: Morgane Naveau MD 11/15/2024 05:12 PM EST RP Workstation: [id number]    Scheduled Meds:  allopurinol   450 mg Oral Daily   colchicine   0.3 mg Oral BID   empagliflozin   10 mg Oral QAC breakfast   enoxaparin (LOVENOX)  injection  40 mg Subcutaneous Q24H   insulin aspart  0-9 Units Subcutaneous TID WC   rosuvastatin   10 mg Oral Daily   Continuous Infusions:  cefTRIAXone  (ROCEPHIN )  IV Stopped (11/15/24 2224)   vancomycin        Unresulted Labs (From admission, onward)     Start     Ordered   11/22/24 0500  Creatinine, serum  (enoxaparin (LOVENOX)    CrCl >/= 30 ml/min)  Weekly,   R     Comments: while on enoxaparin therapy    11/15/24 1949   11/15/24 1950  CBC  (enoxaparin (LOVENOX)    CrCl >/= 30 ml/min)  Once,   R       Comments: Baseline for enoxaparin therapy IF NOT ALREADY DRAWN.  Notify MD if PLT < 100 K.    11/15/24 1949   11/15/24 1950  Creatinine, serum  (enoxaparin (LOVENOX)    CrCl >/= 30 ml/min)  Once,   R  Comments: Baseline for enoxaparin therapy IF NOT ALREADY DRAWN.    11/15/24 1949             LOS:  LOS: 1 day   Time Spent: 40 minutes  Eulogia Dismore Al-Sultani, MD [facility]  If 7PM-7AM, please contact night-coverage  11/16/2024, 9:21 AM      "

## 2024-11-16 NOTE — Progress Notes (Signed)
 Pt refused 5pm insulin. States he does not take that at home

## 2024-11-16 NOTE — Plan of Care (Signed)
  Problem: Pain Managment: Goal: General experience of comfort will improve and/or be controlled Outcome: Progressing   Problem: Safety: Goal: Ability to remain free from injury will improve Outcome: Progressing   Problem: Skin Integrity: Goal: Risk for impaired skin integrity will decrease Outcome: Progressing

## 2024-11-16 NOTE — Care Management Obs Status (Signed)
 MEDICARE OBSERVATION STATUS NOTIFICATION   Patient Details  Name: Steven Knapp MRN: [id number] Date of Birth: [birth year]   Medicare Observation Status Notification Given:  Yes    Debarah Saunas, RN 11/16/2024, 2:15 PM

## 2024-11-16 NOTE — ED Notes (Signed)
 Floor notified patient comoing up

## 2024-11-16 NOTE — Progress Notes (Signed)
 Patient ID: Steven Knapp, male   DOB: 1946-05-05, 78 y.o.   MRN: [id number]   LOS: 1 day   Subjective: Doing much better, pain and swelling have decreased   Objective: Vital signs in last 24 hours: Temp:  [97.3 F (36.3 C)-98.8 F (37.1 C)] 98 F (36.7 C) (12/31 0818) Pulse Rate:  [62-74] 64 (12/31 0818) Resp:  [14-18] 16 (12/31 0818) BP: (143-150)/(74-87) 143/78 (12/31 0818) SpO2:  [100 %] 100 % (12/31 0818) Weight:  [95.3 kg] 95.3 kg (12/30 1404) Last BM Date : 11/16/24   Laboratory  CBC Recent Labs    11/15/24 1517  WBC 7.8  HGB 14.7  HCT 43.7  PLT 276   BMET Recent Labs    11/15/24 1517  NA 138  K 4.3  CL 104  CO2 19*  GLUCOSE 155*  BUN 25*  CREATININE 1.82*  CALCIUM  10.1     Physical Exam General appearance: alert and no distress Right shoulder, elbow, wrist, digits- no skin wounds, mod TTP with passive ext fingers but dorsal hand edema and erythema improved dramatically, no instability, no blocks to motion  Sens  Ax/R/M/U intact  Mot   Ax/ R/ PIN/ M/ AIN/ U intact  Rad 2+   Assessment/Plan: Right hand edema -- Continue present management. Trend reassuring.    Ozell DOROTHA Ned, PA-C Orthopedic Surgery [phone] 11/16/2024

## 2024-11-16 NOTE — Discharge Planning (Signed)
 Transition of Care [facility]) - Inpatient Brief Assessment   Patient Details  Name: Steven Knapp MRN: [id number] Date of Birth: 04/19/1946  Transition of Care [facility]) CM/SW Contact:    Debarah Saunas, RN Phone Number: 11/16/2024, 2:19 PM   Clinical Narrative: Transition of Care Department [facility]) has reviewed patient and no TOC needs have been identified at this time. We will continue to monitor patient advancement through interdisciplinary progression rounds. If new patient transition needs arise, please place a TOC consult.    Transition of Care Asessment: Insurance and Status: Insurance coverage has been reviewed Patient has primary care physician: Yes Home environment has been reviewed: from home alone Prior level of function:: independent (has cane and rolling walker to use as needed) Prior/Current Home Services: No current home services Social Drivers of Health Review: SDOH reviewed no interventions necessary Readmission risk has been reviewed: Yes Transition of care needs: no transition of care needs at this time

## 2024-11-16 NOTE — Progress Notes (Signed)
" °   11/16/24 1524  Vitals  Temp 97.8 F (36.6 C)  Temp Source Oral  BP 131/74  MAP (mmHg) 86  BP Location Left Arm  BP Method Automatic  Patient Position (if appropriate) Lying  Pulse Rate (!) 55  Pulse Rate Source Monitor  Resp 16  Level of Consciousness  Level of Consciousness Alert  MEWS COLOR  MEWS Score Color Green  Oxygen Therapy  SpO2 100 %  O2 Device Room Air  Pain Assessment  Pain Scale 0-10  Pain Score 4  Pain Type Acute pain  Pain Location Hand  Pain Orientation Right  MEWS Score  MEWS Temp 0  MEWS Systolic 0  MEWS Pulse 0  MEWS RR 0  MEWS LOC 0  MEWS Score 0    "

## 2024-11-17 DIAGNOSIS — E1169 Type 2 diabetes mellitus with other specified complication: Secondary | ICD-10-CM | POA: Diagnosis not present

## 2024-11-17 DIAGNOSIS — N2889 Other specified disorders of kidney and ureter: Secondary | ICD-10-CM | POA: Diagnosis not present

## 2024-11-17 DIAGNOSIS — M109 Gout, unspecified: Secondary | ICD-10-CM | POA: Diagnosis not present

## 2024-11-17 DIAGNOSIS — L03119 Cellulitis of unspecified part of limb: Secondary | ICD-10-CM | POA: Diagnosis not present

## 2024-11-17 LAB — CBC
HCT: 41.2 % (ref 39.0–52.0)
Hemoglobin: 13.5 g/dL (ref 13.0–17.0)
MCH: 29.2 pg (ref 26.0–34.0)
MCHC: 32.8 g/dL (ref 30.0–36.0)
MCV: 89 fL (ref 80.0–100.0)
Platelets: 311 K/uL (ref 150–400)
RBC: 4.63 MIL/uL (ref 4.22–5.81)
RDW: 14.9 % (ref 11.5–15.5)
WBC: 7.4 K/uL (ref 4.0–10.5)
nRBC: 0 % (ref 0.0–0.2)

## 2024-11-17 LAB — BASIC METABOLIC PANEL WITH GFR
Anion gap: 11 (ref 5–15)
BUN: 23 mg/dL (ref 8–23)
CO2: 22 mmol/L (ref 22–32)
Calcium: 10.1 mg/dL (ref 8.9–10.3)
Chloride: 106 mmol/L (ref 98–111)
Creatinine, Ser: 1.6 mg/dL — ABNORMAL HIGH (ref 0.61–1.24)
GFR, Estimated: 43 mL/min — ABNORMAL LOW
Glucose, Bld: 155 mg/dL — ABNORMAL HIGH (ref 70–99)
Potassium: 4.5 mmol/L (ref 3.5–5.1)
Sodium: 139 mmol/L (ref 135–145)

## 2024-11-17 MED ORDER — DOXYCYCLINE HYCLATE 100 MG PO TABS: 100.0000 mg | ORAL_TABLET | Freq: Two times a day (BID) | ORAL | 0 refills | Status: AC

## 2024-11-17 MED ORDER — CEFADROXIL 500 MG PO CAPS: 500.0000 mg | ORAL_CAPSULE | Freq: Two times a day (BID) | ORAL | 0 refills | Status: AC

## 2024-11-17 MED ORDER — DOXYCYCLINE HYCLATE 100 MG PO TABS: 100.0000 mg | ORAL_TABLET | Freq: Two times a day (BID) | ORAL | 0 refills | Status: DC

## 2024-11-17 MED ORDER — CEFADROXIL 500 MG PO CAPS: 500.0000 mg | ORAL_CAPSULE | Freq: Two times a day (BID) | ORAL | 0 refills | Status: DC

## 2024-11-17 NOTE — Discharge Summary (Addendum)
 " Physician Discharge Summary   Patient: Steven Knapp MRN: [id number] DOB: 1946/07/30  Admit date:     11/15/2024  Discharge date: 11/17/2024  Discharge Physician: Duffy Al-Sultani   PCP: Sagardia, Miguel Jose, MD   Recommendations at discharge:   Follow-up with PCP within 1 week of discharge Follow-up with orthopedic surgery as needed if symptoms worsen or do not improve within 5 to 7 days  Discharge Diagnoses: Principal Problem:   Acute gout Active Problems:   Cellulitis of hand   Essential hypertension   Chronic renal impairment, stage 3a   History of gout   Type 2 diabetes mellitus with hyperlipidemia (HCC)   History of rheumatoid arthritis  Hospital Course:  The patient is an 79 year old male with PMHx of gout, T2DM, HTN, CKD stage IIIa, RA, who presented with right hand swelling and pain.  Reported symptoms started 6 days prior to arrival, when he noted swelling and tenderness starting at his right elbow all the way down to his right hand.  There was concern about a possible insect bite on the dorsum of the right hand as he was working in the yard around bushes and plants.  He reportedly took colchicine  and allopurinol  for suspected gout attack with improvement in his elbow swelling but not his hand.  Ultimately he presented to the ED for evaluation.  In the ED, CBC was unremarkable.  CMP showed a creatinine of 1.82.  ESR was elevated to 70, CRP was elevated to 8.6.  Uric acid 6.0.  He was evaluated by orthopedic surgery recommended admission with IV antibiotics and MRI to delineate etiology of patient's presentation.   Assessment and Plan:   # Acute gout flare of right hand  # Possible cellulitis of right hand - Patient with history of gout and previous episodes of gout flares in various joints, including his hand - MRI hand showed degenerative and erosive changes involving the interphalangeal joints, MCP joints and the wrist. 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 No findings suspicious for septic arthritis or osteomyelitis. Noted diffuse cellulitis and myofasciitis without findings of discrete drainable soft tissue abscess or pyomyositis. Noted moderate scattered tensosynovitis - Evaluated by orthopedic surgery - felt symptoms most consistent with gouty attack over infectious process given duration of symptoms and no leukocytosis or worsening of symptoms. Recommended treatment of gout with antibiotic coverage for precaution - Patient was treated with 2 days of IV vancomycin  and IV Rocephin  as well as colchicine  and allopurinol  - He reported progressive significant improvement in symptoms with decreasing swelling and improvement in range of motion.  Today, he reports his symptoms have subsided significantly and he is looking to be discharged home to continue therapy. -Patient is hemodynamically stable and appropriate for discharge with significant improvement in swelling, range of motion, and tenderness. - He is to continue allopurinol  daily.  He is to continue colchicine  daily for the next 5 to 7 days or sooner if symptoms resolved. - He was prescribed 5 days of doxycycline and cefadroxil to round out 7 days of antibiotic therapy for possible cellulitis - He is to follow-up with his PCP within 1 week of discharge - Provided Dr. Rojean office's information.  Instructed the patient to reach out to orthopedic surgery if his symptoms do not improve or worsen over the next 7 days  He was resumed  on his home medications for his other comorbidities without any changes.      Consultants: Orthopedic surgery Procedures performed: none  Disposition: Home Diet recommendation:  Diet Orders (From admission, onward)     Start     Ordered   11/15/24 1950  Diet heart healthy/carb  modified Room service appropriate? Yes; Fluid consistency: Thin  Diet effective now       Question Answer Comment  Diet-HS Snack? Nothing   Room service appropriate? Yes   Fluid consistency: Thin      11/15/24 1949            DISCHARGE MEDICATION: Allergies as of 11/17/2024       Reactions   Other Hives, Itching   strawberries, wine, and corn   Shellfish Allergy         Medication List     TAKE these medications    allopurinol  300 MG tablet Commonly known as: ZYLOPRIM  TAKE ONE AND ONE-HALF TABLETS BY MOUTH DAILY   Arginine 500 MG Caps Take 1 capsule by mouth daily.   Bepreve 1.5 % Soln Generic drug: Bepotastine Besilate Place 1 drop into both eyes 2 (two) times daily.   blood glucose meter kit and supplies Dispense based on patient and insurance preference. Use up to four times daily as directed. (FOR ICD-10 E10.9, E11.9).   cefadroxil 500 MG capsule Commonly known as: DURICEF Take 1 capsule (500 mg total) by mouth 2 (two) times daily for 5 days.   cholecalciferol 25 MCG (1000 UNIT) tablet Commonly known as: VITAMIN D3 Take 1,000 Units by mouth daily.   colchicine  0.6 MG tablet TAKE ONE TABLET BY MOUTH DAILY AS NEEDED   doxycycline 100 MG tablet Commonly known as: VIBRA-TABS Take 1 tablet (100 mg total) by mouth 2 (two) times daily for 5 days.   empagliflozin  10 MG Tabs tablet Commonly known as: Jardiance  Take 1 tablet (10 mg total) by mouth daily before breakfast.   FreeStyle Libre Sensor System Misc 1 Device by Does not apply route daily.   glipiZIDE  5 MG tablet Commonly known as: GLUCOTROL  TAKE ONE TABLET BY MOUTH TWICE DAILY BEFORE MEALS   Magnesium 100 MG Tabs Take 1 tablet by mouth daily.   tadalafil  20 MG tablet Commonly known as: CIALIS  Take 0.5-1 tablets (10-20 mg total) by mouth every other day as needed for erectile dysfunction.   VITAMIN B-12 PO Take by mouth daily.   ZINC PO Take 1 tablet by mouth daily.        Follow-up  Information     Purcell Emil Schanz, MD Follow up in 1 week(s).   Specialty: Internal Medicine Why: If symptoms worsen, As needed Contact information: [address] [place] [address] [phone]         Murrell Drivers, MD Follow up.   Specialty: Orthopedic Surgery Why: As needed, If symptoms worsen or don't improve in 5-7 days Contact information: [address] [place] [address] [phone]                 Discharge Exam: Filed Weights   11/15/24 1404  Weight: 95.3 kg   Blood pressure (!) 170/92, pulse (!) 45, temperature 98.2 F (36.8 C), resp. rate 18, height 6' (1.829 m), weight 95.3 kg, SpO2 99%.   Gen: NAD, A&Ox3 CV: RRR, no murmurs Resp: normal WOB, CTAB, no w/r/r Abd: Soft, NTND Ext: Right hand swelling (significantly improved from yesterday), slightly warm to touch, without pain except at PIPs, sensation intact, radial pulse  2+  Skin: Warm, dry Neuro: No focal deficits Psych: Calm, cooperative, appropriate affect  Condition at discharge: good  The results of significant diagnostics from this hospitalization (including imaging, microbiology, ancillary and laboratory) are listed below for reference.   Imaging Studies: MR HAND RIGHT W WO CONTRAST Result Date: 11/15/2024 CLINICAL DATA:  Right hand pain and swelling. EXAM: MRI OF THE RIGHT HAND WITHOUT AND WITH CONTRAST TECHNIQUE: Multiplanar, multisequence MR imaging of the right hand was performed before and after the administration of intravenous contrast. CONTRAST:  10mL GADAVIST  GADOBUTROL  1 MMOL/ML IV SOLN COMPARISON:  Radiographs, same date. FINDINGS: Bones/Joint/Cartilage Degenerative and erosive changes involving the interphalangeal joints, MCP joints and the wrist. 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 No acute bony findings are identified. Although there is enhancing synovitis involving the wrist joint I do not see a significant joint effusion. Septic arthritis is felt to be unlikely. There is diffuse cellulitis and myofasciitis without findings for discrete drainable soft tissue abscess or pyomyositis. Moderate scattered tenosynovitis. IMPRESSION: 1. Degenerative and erosive changes involving the interphalangeal joints, MCP joints and the wrist. 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 2. No findings suspicious for septic arthritis or osteomyelitis. 3. No acute bony findings. 4. Diffuse cellulitis and myofasciitis without findings for discrete drainable soft tissue abscess or pyomyositis. 5. Moderate scattered tenosynovitis. Electronically Signed   By: MYRTIS Stammer M.D.   On: 11/15/2024 21:39   DG Hand Complete Right Result Date: 11/15/2024 EXAM: 3 OR MORE VIEW(S) XRAY OF THE HAND 11/15/2024 03:57:00 PM COMPARISON: None available. CLINICAL HISTORY: edema FINDINGS: BONES AND JOINTS: No acute fracture. No malalignment. Degenerative changes of the first MCP with osteophyte formation and rat bite like peripheral erosions. Severe erosive appearing degenerative changes of the interphalangeal joints with osteophyte formation. Degenerative changes of the radiocarpal joint with joint space narrowing and osteophyte formation. Carpal bone degenerative changes. SOFT TISSUES: Diffuse soft tissue edema of the hand. IMPRESSION: 1. Diffuse soft tissue edema of the hand. 2. Erosive degenerative change within the interphalangeal joints and question of gout of the first metacarpophalangeal joint. Electronically signed by: Morgane Naveau MD 11/15/2024 05:12 PM EST RP Workstation: [id number]    Microbiology: Results for  orders placed or performed in visit on 04/30/21  MICROSCOPIC MESSAGE     Status: None   Collection Time: 04/30/21  9:52 AM  Result Value Ref Range Status   Note   Final    Comment: This urine was analyzed for the presence of WBC,  RBC, bacteria, casts, and other formed elements.  Only those elements seen were reported. . .     Labs: CBC: Recent Labs  Lab 11/15/24 1517 11/16/24 1658 11/17/24 0453  WBC 7.8 6.3 7.4  NEUTROABS 5.7  --   --   HGB 14.7 13.4 13.5  HCT 43.7 39.3 41.2  MCV 88.6 87.5 89.0  PLT 276 304 311   Basic Metabolic Panel: Recent Labs  Lab 11/15/24 1517 11/16/24 0607 11/16/24 1319 11/16/24 1658 11/17/24 0453  NA 138 138 135  --  139  K 4.3 4.2 3.8  --  4.5  CL 104 105 103  --  106  CO2 19* 19*  21*  --  22  GLUCOSE 155* 131* 158*  --  155*  BUN 25* 22 20  --  23  CREATININE 1.82* 1.54* 1.46* 1.57* 1.60*  CALCIUM  10.1 10.0 9.9  --  10.1  PHOS  --  3.0 3.0  --   --    Liver Function Tests: Recent Labs  Lab 11/15/24 1517 11/16/24 0607 11/16/24 1319  AST 29  --   --   ALT 16  --   --   ALKPHOS 69  --   --   BILITOT 0.5  --   --   PROT 8.3*  --   --   ALBUMIN 3.9 3.7 3.6   CBG: Recent Labs  Lab 11/15/24 2359 11/16/24 0815 11/16/24 1127 11/16/24 1647  GLUCAP 188* 133* 218* 140*    Discharge time spent: Time Coordinating Discharge: I spent a total of 35 minutes engaged in face-to-face discussion with the patient and/or caregivers regarding the patients care, assessment, plan, and discharge disposition. Over 50% of this time was dedicated to counseling the patient on the risks and benefits of treatment options and the discharge plan, as well as coordinating post-discharge care.   Signed: Avrianna Smart Al-Sultani, MD [facility] 11/17/2024         "

## 2024-11-17 NOTE — Discharge Instructions (Addendum)
 You were seen for an acute gout flare of your right hand with possible cellulitis of your right hand.  Please continue taking allopurinol  daily Please take your home colchicine  for the next 5 to 7 days.  You can stop sooner if symptoms completely resolve.  Do not exceed 0.2 mg/day.  Stop colchicine  if you develop significant diarrhea. You are prescribed antibiotics (doxycycline and cefadroxil) as precaution given possible cellulitis.  Please take them as prescribed for 5 days to round out 7 days of total antibiotics.  Please reach out to your PCP within 1 week of discharge If your symptoms worsen or fail to improve within the next 5 to 7 days please reach out to orthopedic surgery to schedule an appointment (contact information attached below)

## 2024-12-15 ENCOUNTER — Other Ambulatory Visit: Payer: Self-pay | Admitting: Rheumatology

## 2024-12-15 DIAGNOSIS — M1A09X0 Idiopathic chronic gout, multiple sites, without tophus (tophi): Secondary | ICD-10-CM

## 2024-12-20 LAB — OPHTHALMOLOGY REPORT-SCANNED

## 2024-12-27 ENCOUNTER — Ambulatory Visit: Admitting: Emergency Medicine

## 2024-12-27 ENCOUNTER — Inpatient Hospital Stay: Admitting: Emergency Medicine

## 2025-01-02 ENCOUNTER — Ambulatory Visit: Admitting: Emergency Medicine

## 2025-01-04 ENCOUNTER — Ambulatory Visit: Admitting: Podiatry

## 2025-06-09 ENCOUNTER — Ambulatory Visit
